# Patient Record
Sex: Female | Born: 1950
Health system: Southern US, Community
[De-identification: ages and names within clinical notes are randomized; demographics above are authoritative.]

## PROBLEM LIST (undated history)

## (undated) DIAGNOSIS — H3500 Unspecified background retinopathy: Secondary | ICD-10-CM

## (undated) DIAGNOSIS — I639 Cerebral infarction, unspecified: Secondary | ICD-10-CM

## (undated) DIAGNOSIS — M1990 Unspecified osteoarthritis, unspecified site: Secondary | ICD-10-CM

## (undated) DIAGNOSIS — D869 Sarcoidosis, unspecified: Secondary | ICD-10-CM

## (undated) DIAGNOSIS — E11319 Type 2 diabetes mellitus with unspecified diabetic retinopathy without macular edema: Secondary | ICD-10-CM

## (undated) DIAGNOSIS — I10 Essential (primary) hypertension: Secondary | ICD-10-CM

## (undated) DIAGNOSIS — H269 Unspecified cataract: Secondary | ICD-10-CM

## (undated) HISTORY — DX: Unspecified background retinopathy: H35.00

## (undated) HISTORY — DX: Unspecified cataract: H26.9

## (undated) HISTORY — DX: Cerebral infarction, unspecified: I63.9

## (undated) HISTORY — DX: Type 2 diabetes mellitus with unspecified diabetic retinopathy without macular edema: E11.319

## (undated) HISTORY — PX: PET/CT MONITORING/KAPOSIS/SARCOM (ARMC HX): HXRAD1179

## (undated) HISTORY — DX: Unspecified osteoarthritis, unspecified site: M19.90

## (undated) HISTORY — PX: INTRAOCULAR LENS INSERTION: SHX110

---

## 1997-06-14 HISTORY — PX: KNEE SURGERY: SHX244

## 2002-04-08 ENCOUNTER — Emergency Department (HOSPITAL_COMMUNITY): Admission: EM | Admit: 2002-04-08 | Discharge: 2002-04-08 | Payer: Self-pay | Admitting: Emergency Medicine

## 2002-08-22 ENCOUNTER — Encounter: Admission: RE | Admit: 2002-08-22 | Discharge: 2002-11-20 | Payer: Self-pay | Admitting: *Deleted

## 2003-08-26 ENCOUNTER — Emergency Department (HOSPITAL_COMMUNITY): Admission: AD | Admit: 2003-08-26 | Discharge: 2003-08-26 | Payer: Self-pay | Admitting: Family Medicine

## 2005-06-19 ENCOUNTER — Emergency Department (HOSPITAL_COMMUNITY): Admission: EM | Admit: 2005-06-19 | Discharge: 2005-06-19 | Payer: Self-pay | Admitting: Family Medicine

## 2006-09-10 ENCOUNTER — Emergency Department (HOSPITAL_COMMUNITY): Admission: EM | Admit: 2006-09-10 | Discharge: 2006-09-10 | Payer: Self-pay | Admitting: Family Medicine

## 2007-03-13 ENCOUNTER — Emergency Department (HOSPITAL_COMMUNITY): Admission: EM | Admit: 2007-03-13 | Discharge: 2007-03-13 | Payer: Self-pay | Admitting: Emergency Medicine

## 2007-11-02 ENCOUNTER — Emergency Department (HOSPITAL_COMMUNITY): Admission: EM | Admit: 2007-11-02 | Discharge: 2007-11-02 | Payer: Self-pay | Admitting: Family Medicine

## 2008-06-04 ENCOUNTER — Encounter: Payer: Self-pay | Admitting: Internal Medicine

## 2008-06-04 LAB — HM DIABETES EYE EXAM

## 2008-06-26 ENCOUNTER — Encounter: Payer: Self-pay | Admitting: Internal Medicine

## 2008-06-26 ENCOUNTER — Ambulatory Visit: Payer: Self-pay | Admitting: Internal Medicine

## 2008-06-26 DIAGNOSIS — E11319 Type 2 diabetes mellitus with unspecified diabetic retinopathy without macular edema: Secondary | ICD-10-CM | POA: Insufficient documentation

## 2008-06-26 DIAGNOSIS — I10 Essential (primary) hypertension: Secondary | ICD-10-CM | POA: Insufficient documentation

## 2008-06-26 DIAGNOSIS — E1165 Type 2 diabetes mellitus with hyperglycemia: Secondary | ICD-10-CM

## 2008-06-26 DIAGNOSIS — E1139 Type 2 diabetes mellitus with other diabetic ophthalmic complication: Secondary | ICD-10-CM

## 2008-06-26 DIAGNOSIS — E1159 Type 2 diabetes mellitus with other circulatory complications: Secondary | ICD-10-CM

## 2008-06-26 DIAGNOSIS — M1712 Unilateral primary osteoarthritis, left knee: Secondary | ICD-10-CM | POA: Insufficient documentation

## 2008-06-26 HISTORY — DX: Type 2 diabetes mellitus with other circulatory complications: E11.59

## 2008-06-26 LAB — CONVERTED CEMR LAB: Blood Glucose, Fingerstick: 279

## 2008-06-27 ENCOUNTER — Encounter: Payer: Self-pay | Admitting: Internal Medicine

## 2008-06-27 LAB — CONVERTED CEMR LAB
ALT: 13 units/L (ref 0–35)
Albumin: 4.1 g/dL (ref 3.5–5.2)
Basophils Absolute: 0 10*3/uL (ref 0.0–0.1)
CO2: 25 meq/L (ref 19–32)
Calcium: 9.5 mg/dL (ref 8.4–10.5)
Chloride: 102 meq/L (ref 96–112)
Creatinine, Urine: 97.9 mg/dL
Eosinophils Relative: 1 % (ref 0–5)
Glucose, Bld: 265 mg/dL — ABNORMAL HIGH (ref 70–99)
HCT: 41.1 % (ref 36.0–46.0)
Lymphocytes Relative: 33 % (ref 12–46)
Lymphs Abs: 2.1 10*3/uL (ref 0.7–4.0)
Microalb Creat Ratio: 34.9 mg/g — ABNORMAL HIGH (ref 0.0–30.0)
Neutro Abs: 3.6 10*3/uL (ref 1.7–7.7)
Neutrophils Relative %: 58 % (ref 43–77)
Platelets: 283 10*3/uL (ref 150–400)
Potassium: 4.5 meq/L (ref 3.5–5.3)
RDW: 13.5 % (ref 11.5–15.5)
Sodium: 139 meq/L (ref 135–145)
TSH: 0.892 microintl units/mL (ref 0.350–4.50)
Total Protein: 7.7 g/dL (ref 6.0–8.3)
WBC: 6.1 10*3/uL (ref 4.0–10.5)

## 2008-07-03 ENCOUNTER — Telehealth (INDEPENDENT_AMBULATORY_CARE_PROVIDER_SITE_OTHER): Payer: Self-pay | Admitting: *Deleted

## 2008-07-10 ENCOUNTER — Ambulatory Visit: Payer: Self-pay | Admitting: Internal Medicine

## 2008-07-10 ENCOUNTER — Encounter: Payer: Self-pay | Admitting: Internal Medicine

## 2008-07-10 LAB — CONVERTED CEMR LAB: Blood Glucose, Fingerstick: 154

## 2008-07-18 ENCOUNTER — Encounter: Payer: Self-pay | Admitting: Internal Medicine

## 2008-07-19 DIAGNOSIS — E785 Hyperlipidemia, unspecified: Secondary | ICD-10-CM

## 2008-07-19 LAB — CONVERTED CEMR LAB
BUN: 14 mg/dL (ref 6–23)
Calcium: 9.1 mg/dL (ref 8.4–10.5)
Chloride: 104 meq/L (ref 96–112)
Creatinine, Ser: 0.81 mg/dL (ref 0.40–1.20)
HDL: 62 mg/dL (ref >39)
LDL Cholesterol: 133 mg/dL — ABNORMAL HIGH (ref 0–99)
Triglycerides: 95 mg/dL (ref <150)

## 2008-07-22 ENCOUNTER — Telehealth (INDEPENDENT_AMBULATORY_CARE_PROVIDER_SITE_OTHER): Payer: Self-pay | Admitting: *Deleted

## 2008-08-26 ENCOUNTER — Ambulatory Visit: Payer: Self-pay | Admitting: Internal Medicine

## 2008-08-26 LAB — CONVERTED CEMR LAB: Hgb A1c MFr Bld: 10.1 %

## 2008-11-06 ENCOUNTER — Ambulatory Visit: Payer: Self-pay | Admitting: Internal Medicine

## 2008-11-06 ENCOUNTER — Encounter (INDEPENDENT_AMBULATORY_CARE_PROVIDER_SITE_OTHER): Payer: Self-pay | Admitting: Internal Medicine

## 2008-11-06 LAB — CONVERTED CEMR LAB: Hgb A1c MFr Bld: 8.2 %

## 2008-11-07 ENCOUNTER — Encounter (INDEPENDENT_AMBULATORY_CARE_PROVIDER_SITE_OTHER): Payer: Self-pay | Admitting: Internal Medicine

## 2008-11-07 LAB — CONVERTED CEMR LAB
ALT: 13 units/L (ref 0–35)
Albumin: 4.1 g/dL (ref 3.5–5.2)
Alkaline Phosphatase: 68 units/L (ref 39–117)
CO2: 25 meq/L (ref 19–32)
GFR calc non Af Amer: 34 mL/min — ABNORMAL LOW (ref >60)
Glucose, Bld: 235 mg/dL — ABNORMAL HIGH (ref 70–99)
HDL: 48 mg/dL (ref >39)
LDL Cholesterol: 91 mg/dL (ref 0–99)
Potassium: 4.8 meq/L (ref 3.5–5.3)
Sodium: 138 meq/L (ref 135–145)
Total Bilirubin: 0.4 mg/dL (ref 0.3–1.2)
Total CHOL/HDL Ratio: 3.4
Total Protein: 7.3 g/dL (ref 6.0–8.3)

## 2008-11-08 ENCOUNTER — Telehealth (INDEPENDENT_AMBULATORY_CARE_PROVIDER_SITE_OTHER): Payer: Self-pay | Admitting: Internal Medicine

## 2008-11-15 ENCOUNTER — Ambulatory Visit: Payer: Self-pay | Admitting: Internal Medicine

## 2008-11-15 ENCOUNTER — Encounter (INDEPENDENT_AMBULATORY_CARE_PROVIDER_SITE_OTHER): Payer: Self-pay | Admitting: Internal Medicine

## 2008-11-18 LAB — CONVERTED CEMR LAB
Calcium: 9.4 mg/dL (ref 8.4–10.5)
GFR calc Af Amer: 52 mL/min — ABNORMAL LOW (ref >60)
GFR calc non Af Amer: 43 mL/min — ABNORMAL LOW (ref >60)
Potassium: 4.3 meq/L (ref 3.5–5.3)
Sodium: 141 meq/L (ref 135–145)

## 2008-11-20 ENCOUNTER — Ambulatory Visit: Payer: Self-pay | Admitting: Internal Medicine

## 2008-11-20 LAB — CONVERTED CEMR LAB
OCCULT 1: NEGATIVE
OCCULT 2: NEGATIVE
OCCULT 3: NEGATIVE

## 2008-11-20 LAB — FECAL OCCULT BLOOD, GUAIAC: Fecal Occult Blood: NEGATIVE

## 2008-12-31 ENCOUNTER — Ambulatory Visit (HOSPITAL_COMMUNITY): Admission: RE | Admit: 2008-12-31 | Discharge: 2008-12-31 | Payer: Self-pay | Admitting: Internal Medicine

## 2009-01-06 ENCOUNTER — Encounter: Admission: RE | Admit: 2009-01-06 | Discharge: 2009-01-06 | Payer: Self-pay | Admitting: Internal Medicine

## 2009-01-06 ENCOUNTER — Encounter: Payer: Self-pay | Admitting: Internal Medicine

## 2009-01-06 LAB — HM MAMMOGRAPHY

## 2009-01-07 ENCOUNTER — Ambulatory Visit: Payer: Self-pay | Admitting: Internal Medicine

## 2009-01-07 ENCOUNTER — Encounter: Payer: Self-pay | Admitting: Internal Medicine

## 2009-01-07 LAB — CONVERTED CEMR LAB
BUN: 16 mg/dL (ref 6–23)
CO2: 25 meq/L (ref 19–32)
Calcium: 9.9 mg/dL (ref 8.4–10.5)
Creatinine, Ser: 1.07 mg/dL (ref 0.40–1.20)
Glucose, Bld: 166 mg/dL — ABNORMAL HIGH (ref 70–99)

## 2009-03-12 ENCOUNTER — Ambulatory Visit: Payer: Self-pay | Admitting: Internal Medicine

## 2009-03-12 LAB — CONVERTED CEMR LAB: Blood Glucose, Fingerstick: 213

## 2009-04-11 ENCOUNTER — Ambulatory Visit: Payer: Self-pay | Admitting: Internal Medicine

## 2009-04-23 ENCOUNTER — Telehealth: Payer: Self-pay | Admitting: Internal Medicine

## 2009-05-26 ENCOUNTER — Encounter: Payer: Self-pay | Admitting: Internal Medicine

## 2009-10-01 ENCOUNTER — Encounter: Payer: Self-pay | Admitting: Internal Medicine

## 2009-10-29 ENCOUNTER — Telehealth: Payer: Self-pay | Admitting: Internal Medicine

## 2009-10-30 ENCOUNTER — Encounter: Payer: Self-pay | Admitting: Internal Medicine

## 2009-10-30 ENCOUNTER — Ambulatory Visit (HOSPITAL_COMMUNITY): Admission: RE | Admit: 2009-10-30 | Discharge: 2009-10-30 | Payer: Self-pay | Admitting: Internal Medicine

## 2009-10-30 ENCOUNTER — Ambulatory Visit: Payer: Self-pay | Admitting: Internal Medicine

## 2009-10-30 LAB — CONVERTED CEMR LAB: Hgb A1c MFr Bld: 8.8 %

## 2009-10-31 LAB — CONVERTED CEMR LAB
Albumin: 3.9 g/dL (ref 3.5–5.2)
BUN: 25 mg/dL — ABNORMAL HIGH (ref 6–23)
Basophils Relative: 1 % (ref 0–1)
CO2: 24 meq/L (ref 19–32)
Cholesterol: 209 mg/dL — ABNORMAL HIGH (ref 0–200)
Eosinophils Relative: 3 % (ref 0–5)
Glucose, Bld: 235 mg/dL — ABNORMAL HIGH (ref 70–99)
HCT: 38.9 % (ref 36.0–46.0)
Hemoglobin: 12.3 g/dL (ref 12.0–15.0)
MCHC: 31.6 g/dL (ref 30.0–36.0)
MCV: 79.2 fL (ref >=78.0)
Monocytes Absolute: 0.4 10*3/uL (ref 0.1–1.0)
Monocytes Relative: 6 % (ref 3–12)
Neutro Abs: 4.3 10*3/uL (ref 1.7–7.7)
Potassium: 4.8 meq/L (ref 3.5–5.3)
RBC: 4.91 M/uL (ref 3.87–5.11)
Sodium: 136 meq/L (ref 135–145)
Total Bilirubin: 0.3 mg/dL (ref 0.3–1.2)
Total Protein: 8 g/dL (ref 6.0–8.3)
Triglycerides: 95 mg/dL (ref <150)

## 2009-11-14 ENCOUNTER — Ambulatory Visit: Payer: Self-pay | Admitting: Internal Medicine

## 2009-12-29 ENCOUNTER — Telehealth: Payer: Self-pay | Admitting: Internal Medicine

## 2010-01-02 ENCOUNTER — Telehealth: Payer: Self-pay | Admitting: Internal Medicine

## 2010-01-26 ENCOUNTER — Telehealth: Payer: Self-pay | Admitting: Internal Medicine

## 2010-01-26 ENCOUNTER — Ambulatory Visit: Payer: Self-pay | Admitting: Internal Medicine

## 2010-01-26 LAB — CONVERTED CEMR LAB
Blood Glucose, AC Bkfst: 195 mg/dL
Hgb A1c MFr Bld: 8.2 %

## 2010-01-26 LAB — HM DIABETES FOOT EXAM

## 2010-05-11 ENCOUNTER — Ambulatory Visit: Payer: Self-pay | Admitting: Internal Medicine

## 2010-05-11 ENCOUNTER — Encounter: Payer: Self-pay | Admitting: Internal Medicine

## 2010-05-11 DIAGNOSIS — D869 Sarcoidosis, unspecified: Secondary | ICD-10-CM

## 2010-05-11 LAB — CONVERTED CEMR LAB: Blood Glucose, Fingerstick: 229

## 2010-07-14 NOTE — Assessment & Plan Note (Signed)
Summary: acute-recheck bp and diabetes([place])/cfb   Vital Signs:  Patient profile:   60 year old female Height:      64 inches Weight:      24.1 pounds BMI:     4.15 Temp:     98.8 degrees F oral Pulse rate:   72 / minute BP sitting:   134 / 77  (right arm)  Vitals Entered By: Filomena Jungling NT II (January 26, 2010 10:21 AM)   Diabetic Foot Exam Last Podiatry Exam Date: 01/26/2010  Foot Inspection Is there a history of a foot ulcer?              No Is there a foot ulcer now?              No Is there swelling or an abnormal foot shape?          No Are the toenails long?                No Are the toenails thick?                No Are the toenails ingrown?              No Is there heavy callous build-up?              No Is there pain in the calf muscle (Intermittent claudication) when walking?    NoIs there a claw toe deformity?              No Is there elevated skin temperature?            No Is there limited ankle dorsiflexion?            No Is there foot or ankle muscle weakness?            No  Diabetic Foot Care Education Pulse Check          Right Foot          Left Foot Posterior Tibial:        normal            normal Dorsalis Pedis:        normal            normal  High Risk Feet? Yes   10-g (5.07) Semmes-Weinstein Monofilament Test           Right Foot          Left Foot Visual Inspection               Test Control      normal         normal Site 1         normal         normal Site 2         normal         normal Site 3         normal         normal Site 4         normal         normal Site 5         normal         normal Site 6         normal         normal Site 7         normal         normal Site 8  normal         normal Site 9         normal         normal Site 10         normal         normal  Impression      normal         normal CC: ? CYST ON NECKSINCE THURSDAY Is Patient Diabetic? Yes Did you bring your meter with you today? No Pain  Assessment Patient in pain? yes     Location: HEADACHE Intensity: 9 Type: aching Onset of pain  SINCE,THURSDAY Nutritional Status BMI of > 30 = obese  Have you ever been in a relationship where you felt threatened, hurt or afraid?No   Does patient need assistance? Functional Status Self care Ambulation Normal   Primary Care Provider:  Blondell Reveal MD  CC:  ? CYST ON NECKSINCE THURSDAY.  History of Present Illness: 1. c/o a "boil to a nape area; recurrent; but "now it drains and very sore." Denies any fever, chills, HA, rash or any other Sx. 2. No other concerns.  Preventive Screening-Counseling & Management  Alcohol-Tobacco     Smoking Status: quit     Year Quit: 1977     Passive Smoke Exposure: no  Caffeine-Diet-Exercise     Does Patient Exercise: yes     Type of exercise: walking     Times/week: 2  Problems Prior to Update: 1)  Furuncle, Recurrent  (ICD-680.9) 2)  Hordeolum, Internal  (ICD-373.12) 3)  Unspecified Arthropathy Shoulder Region  (ICD-716.91) 4)  Insomnia  (ICD-780.52) 5)  Renal Insufficiency, Acute  (ICD-585.9) 6)  Menopause-related Vasomotor Symptoms, Hot Flashes  (ICD-627.2) 7)  Hyperlipidemia  (ICD-272.4) 8)  Osteoarthritis, Knee, Left  (ICD-715.96) 9)  Pulmonary Sarcoidosis  (ICD-135) 10)  Diabetic Retinopathy  (ICD-250.50) 11)  Essential Hypertension, Benign  (ICD-401.1) 12)  Dm  (ICD-250.00)  Medications Prior to Update: 1)  Glipizide 10 Mg Tabs (Glipizide) .... Take 1 Tablet By Mouth Once A Day 2)  Atenolol-Chlorthalidone 100-25 Mg Tabs (Atenolol-Chlorthalidone) .... Take 1 Tablet By Mouth Once A Day At Bedtime 3)  Benadryl 25 Mg Caps (Diphenhydramine Hcl) .... Take 1 Tablet At Bedtime As Needed Insomnia.  Allergies (verified): No Known Drug Allergies  Directives (verified): 1)  Full Code   Past History:  Past Medical History: Last updated: 07/10/2008 HTN- diagnosed 4 yrs ago. DM- diagnosed 5 yrs ago. Pulmonary Sarcoidosis -  She was  treated with Prednisone and some other medication which she couldn't remeber at [facility] for nearly 5 years. Apparently they tappered her Prednisone and oneday radiologist said she has no radiological evidence of Prednisone and since then she stopped following up there. (The entire history is as per patient) Bilateral Diabetic retinopathy.      Past Surgical History: Last updated: 2008/07/17 Lleft knee arthroscopy  Family History: Last updated: 2008/07/17 Mom - DM , HTN, died at 28. Dad - unknown. 4 brothers:  1 brother died of lung cancer, 1 died of stomach cancer, 1 brother died of  heart attack (40's) 2 sisters: 1 sister died (murdered) and the other healthy.  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 married, lives with husband 4 sons - healthy , 1 htn. Cell number : [phone]  Risk Factors: Exercise: yes (01/26/2010)  Risk Factors: Smoking Status: quit (01/26/2010)  Passive Smoke Exposure: no (01/26/2010)  Review of Systems       per HPI  Physical Exam  General:  alert, well-developed, well-nourished, and well-hydrated.   Head:  normocephalic and atraumatic.   Eyes:  Mild swelling of R eyelid. No erythema overlying lid, no injection, no chemosis, no mucopurulent discharge. Attempt to evert eyelid but was limited given patient discomfort. Noted small swelling on inner eyelid near medial canthus c/w hordeolum. Vision grossly intact.   Ears:  no external deformities.   Nose:  no external deformity, no external erythema, and no nasal discharge.   Mouth:  Oral mucosa and oropharynx without lesions or exudates.  Teeth in good repair. Neck:  No deformities, masses, or tenderness noted. Lungs:  normal respiratory effort, normal breath sounds, no crackles, and no wheezes.   Abdomen:  soft and non-tender.   Msk:  No deformity or scoliosis noted of thoracic  or lumbar spine.  She has severely spasmed trapezius and limited ROM of her left shoulder. She is unable to do teh "back scratch manuever". Multiple thoracic, cervical tender points. Limited ROM rotational of her neck Pulses:  R radial normal.   Extremities:  trace left pedal edema and trace right pedal edema. pulses normal bilat.  Neurologic:  alert & oriented X3, cranial nerves II-XII intact, and gait normal.   Skin:  turgor normal and color normal.   Cervical Nodes:  No lymphadenopathy noted Axillary Nodes:  No palpable lymphadenopathy Psych:  Cognition and judgment appear intact. Alert and cooperative with normal attention span and concentration. No apparent delusions, illusions, hallucinations  Diabetes Management Exam:    Foot Exam (with socks and/or shoes not present):       Sensory-Monofilament:          Left foot: normal          Right foot: normal   Impression & Recommendations:  Problem # 1:  RENAL INSUFFICIENCY, ACUTE (ICD-585.9) Will recheck renal fxn. Labs Reviewed: BUN: 25 (10/30/2009)   Cr: 1.31 (10/30/2009)    Hgb: 12.3 (10/30/2009)   Hct: 38.9 (10/30/2009)   Ca++: 9.3 (10/30/2009)    TP: 8.0 (10/30/2009)   Alb: 3.9 (10/30/2009)  Problem # 2:  HYPERLIPIDEMIA (ICD-272.4) Low fat diet; exercise discussed. Labs Reviewed: SGOT: 19 (10/30/2009)   SGPT: 13 (10/30/2009)  Lipid Goals: LDL Goal: 100 (11/14/2009)     Prior 10 Yr Risk Heart Disease: 15 % (11/14/2009)   HDL:55 (10/30/2009), 48 (11/06/2008)  LDL:135 (10/30/2009), 91 (47/82/9562)  Chol:209 (10/30/2009), 162 (11/06/2008)  Trig:95 (10/30/2009), 114 (11/06/2008)  Problem # 3:  ESSENTIAL HYPERTENSION, BENIGN (ICD-401.1) Controlled. Her updated medication list for this problem includes:    Atenolol-chlorthalidone 100-25 Mg Tabs (Atenolol-chlorthalidone) .Marland Kitchen... Take 1 tablet by mouth once a day at bedtime  BP today: 134/77 Prior BP: 138/81 (11/14/2009)  Prior 10 Yr Risk Heart Disease: 15 % (11/14/2009)  Labs  Reviewed: K+: 4.8 (10/30/2009) Creat: : 1.31 (10/30/2009)   Chol: 209 (10/30/2009)   HDL: 55 (10/30/2009)   LDL: 135 (10/30/2009)   TG: 95 (10/30/2009)  Problem # 4:  FURUNCLE, RECURRENT (ICD-680.9) Doxycycline by mouth for 10 days; Tramdaol as needed for pain. Instructed on wound care. Orders: Surgical Referral (Surgery)  Complete Medication List: 1)  Glipizide 10 Mg Tabs (Glipizide) .... Take 1 tablet by mouth once a day 2)  Atenolol-chlorthalidone 100-25 Mg Tabs (Atenolol-chlorthalidone) .... Take 1 tablet by mouth once a day at bedtime 3)  Benadryl 25 Mg Caps (Diphenhydramine hcl) .... Take 1 tablet at  bedtime as needed insomnia. 4)  Doxycycline Hyclate 100 Mg Tabs (Doxycycline hyclate) .... Take 1 tablet by mouth two times a day with meals  for 10 days 5)  Tramadol Hcl 50 Mg Tabs (Tramadol hcl) .... Take 1 tablet by mouth four times a day as needed  Other Orders: T- Capillary Blood Glucose (62130) T-Hgb A1C (in-house) (86578IO) T-Urine Microalbumin w/creat. ratio [id number]) Ophthalmology Referral (Ophthalmology) Mammogram (Screening) (Mammo)  Patient Instructions: 1)  Please, return to clinic in 3 months (FASTING). 2)  Call with any questions. 3)  Call and ask for Ms. Mamie if do not hear from [place] about a referral to a surgeon for 1 week. Prescriptions: ATENOLOL-CHLORTHALIDONE 100-25 MG TABS (ATENOLOL-CHLORTHALIDONE) Take 1 tablet by mouth once a day at bedtime  #30 x 11   Entered and Authorized by:   Deatra Robinson MD   Signed by:   Deatra Robinson MD on 01/26/2010   Method used:   Electronically to        [facility] [address] (retail)       [phone]       [place], [place]  [address]       Ph: [phone]       Fax: [phone]   RxID:   [id number] GLIPIZIDE 10 MG TABS (GLIPIZIDE) Take 1 tablet by mouth once a day  #30 x 7   Entered and Authorized by:   Deatra Robinson MD   Signed by:   Deatra Robinson MD on  01/26/2010   Method used:   Electronically to        [facility] [address] (retail)       [phone]       [place], [place]  [address]       Ph: [phone]       Fax: [phone]   RxID:   [id number] TRAMADOL HCL  POWD (TRAMADOL HCL) four times a day as needed  #60 x 1   Entered and Authorized by:   Deatra Robinson MD   Signed by:   Deatra Robinson MD on 01/26/2010   Method used:   Electronically to        [facility] [address] (retail)       [phone]       [place], [place]  [address]       Ph: [phone]       Fax: [phone]   RxID:   [id number] DOXYCYCLINE HYCLATE 100 MG TABS (DOXYCYCLINE HYCLATE) Take 1 tablet by mouth two times a day with meals  for 10 days  #20 x 0   Entered and Authorized by:   Deatra Robinson MD   Signed by:   Deatra Robinson MD on 01/26/2010   Method used:   Electronically to        [facility] [address] (retail)       [phone]       [place], [place]  [address]       Ph: [phone]       Fax: [phone]   RxID:   [id number]  Process Orders Check Orders Results:     [facility]: ABN not required for this insurance Tests Sent for requisitioning (January 27, 2010 12:20 PM):     01/26/2010: [facility] --  T-Urine Microalbumin w/creat. ratio [[id number]] (signed)    Prevention & Chronic Care Immunizations   Influenza vaccine: Not documented   Influenza vaccine deferral: Not indicated  (04/11/2009)   Influenza vaccine due: 02/12/2010    Tetanus booster: Not documented   Td booster deferral: Deferred  (01/26/2010)   Tetanus booster due: 01/27/2020    Pneumococcal vaccine: Not documented   Pneumococcal vaccine deferral: Refused  (04/11/2009)  Colorectal Screening   Hemoccult: Not documented   Hemoccult action/deferral: Refused  (01/26/2010)   Hemoccult due: 01/27/2011     Colonoscopy: Not documented   Colonoscopy action/deferral: Refused  (01/26/2010)  Other Screening   Pap smear: Not documented   Pap smear action/deferral: Refused  (01/26/2010)   Pap smear due: 01/27/2011    Mammogram: BI-RADS CATEGORY 3:  Probably benign finding(s) - short interval^MM DIGITAL DIAG LTD R  (01/06/2009)   Mammogram action/deferral: Ordered  (01/26/2010)   Smoking status: quit  (01/26/2010)  Diabetes Mellitus   HgbA1C: 8.2  (01/26/2010)    Eye exam: Mild non-proliferative diabetic retinopathy.   Clinically significant macular edema.   Cataract.   exam by Heather Burundi, OD Referral recommended to retinal specialist  (06/04/2008)   Diabetic eye exam action/deferral: Refused  (01/26/2010)   Eye exam due: 09/2008    Foot exam: yes  (01/26/2010)   Foot exam action/deferral: Do today   High risk foot: Yes  (01/26/2010)   Foot care education: Not documented    Urine microalbumin/creatinine ratio: 34.9  (06/26/2008)   Urine microalbumin action/deferral: Ordered   Urine microalbumin/cr due: 01/27/2011    Diabetes flowsheet reviewed?: Yes   Progress toward A1C goal: Improved  Lipids   Total Cholesterol: 209  (10/30/2009)   LDL: 135  (10/30/2009)   LDL Direct: Not documented   HDL: 55  (10/30/2009)   Triglycerides: 95  (10/30/2009)   Lipid panel due: 10/31/2010    SGOT (AST): 19  (10/30/2009)   SGPT (ALT): 13  (10/30/2009)   Alkaline phosphatase: 98  (10/30/2009)   Total bilirubin: 0.3  (10/30/2009)    Lipid flowsheet reviewed?: Yes   Progress toward LDL goal: Unchanged  Hypertension   Last Blood Pressure: 134 / 77  (01/26/2010)   Serum creatinine: 1.31  (10/30/2009)   Serum potassium 4.8  (10/30/2009)    Hypertension flowsheet reviewed?: Yes   Progress toward BP goal: Unchanged  Self-Management Support :    Patient will work on the following items until the next clinic visit to reach self-care goals:     Medications and monitoring: take my medicines  every day, check my blood sugar  (01/26/2010)     Eating: drink diet soda or water instead of juice or soda, eat more vegetables, use fresh or frozen vegetables, eat baked foods instead of fried foods, eat fruit for snacks and desserts  (01/26/2010)     Activity: take a 30 minute walk every day  (01/26/2010)    Diabetes self-management support: Education handout, Resources for patients handout  (01/26/2010)   Diabetes education handout printed    Hypertension self-management support: Education handout, Resources for patients handout  (01/26/2010)   Hypertension education handout printed    Lipid self-management support: Education handout, Resources for patients handout  (01/26/2010)     Lipid education handout printed      Resource handout printed.   Nursing Instructions: Schedule screening mammogram (see order) Diabetic foot exam today    Laboratory Results   Blood Tests   Date/Time Received: January 26, 2010 10:35 AM  Date/Time Reported: Burke Keels  January 26, 2010 10:36 AM   HGBA1C: 8.2%   (Normal Range: Non-Diabetic - 3-6%   Control Diabetic - 6-8%) CBG Fasting:: 195mg /dL

## 2010-07-14 NOTE — Progress Notes (Signed)
Summary: Refill/gh  Phone Note Refill Request Message from:  Fax from Pharmacy on January 02, 2010 11:13 AM  Refills Requested: Medication #1:  ATENOLOL-CHLORTHALIDONE 100-25 MG TABS Take 1 tablet by mouth once a day at bedtime   Last Refilled: 11/30/2009  Method Requested: Electronic Initial call taken by: Angelina Ok RN,  January 02, 2010 11:13 AM  Follow-up for Phone Call       Follow-up by: Blondell Reveal MD,  January 03, 2010 5:44 PM    Prescriptions: ATENOLOL-CHLORTHALIDONE 100-25 MG TABS (ATENOLOL-CHLORTHALIDONE) Take 1 tablet by mouth once a day at bedtime  #30 x 1   Entered and Authorized by:   Blondell Reveal MD   Signed by:   Blondell Reveal MD on 01/03/2010   Method used:   Electronically to        [facility] [address] (retail)       [phone]       [place], [place]  [address]       Ph: [phone]       Fax: [phone]   RxID:   [id number]

## 2010-07-14 NOTE — Progress Notes (Signed)
Summary: refill/ hla  Phone Note Refill Request Message from:  Fax from Pharmacy on December 29, 2009 3:29 PM  Refills Requested: Medication #1:  GLIPIZIDE 10 MG TABS Take 1 tablet by mouth once a day   Last Refilled: 6/19 last visit 6/3 and 5/19 for labs  Initial call taken by: Marin Roberts RN,  December 29, 2009 3:30 PM  Follow-up for Phone Call       Follow-up by: Blondell Reveal MD,  January 01, 2010 2:46 PM    Prescriptions: GLIPIZIDE 10 MG TABS (GLIPIZIDE) Take 1 tablet by mouth once a day  #30 x 5   Entered and Authorized by:   Blondell Reveal MD   Signed by:   Blondell Reveal MD on 01/01/2010   Method used:   Electronically to        [facility] [address] (retail)       [phone]       [place], [place]  [address]       Ph: [phone]       Fax: [phone]   RxID:   [id number]

## 2010-07-14 NOTE — Assessment & Plan Note (Signed)
Summary: Wendy [name]/2 WEEK RECHECK PER GOLDING/CH   Vital Signs:  Patient profile:   60 year old female Height:      64 inches Weight:      243.1 pounds BMI:     41.88 Temp:     97.9 degrees F oral Pulse rate:   77 / minute BP sitting:   138 / 81  (right arm)  Vitals Entered By: Filomena Jungling NT II (November 14, 2009 3:27 PM) CC: EYE HURT X 1 WEEK, TRAZONDONE MAKES FEEL FUNNY Is Patient Diabetic? Yes Did you bring your meter with you today? No Pain Assessment Patient in pain? yes     Location: eye Intensity: 5 Nutritional Status BMI of > 30 = obese  Have you ever been in a relationship where you felt threatened, hurt or afraid?No   Does patient need assistance? Functional Status Self care Ambulation Normal    Primary Care Provider:  Blondell Reveal MD  CC:  EYE HURT X 1 WEEK and TRAZONDONE MAKES FEEL FUNNY.  History of Present Illness: Patient presents for 2 week followup regarding:  BP: Tolerating new medications well. Appears to have better BP control during last 2 weeks. Denies dizziness, chest pain, palpitations or shortness of breath.  Insomnia - trazadone helps a little, makes her loopy in the morning. Denies obstructive sleep apnea symptoms. Trouble getting to sleep and staying asleep - gets about 4 hours per night, then has to sleep some during the day. Affirms that she has changed some habits in order to improve her sleep hygiene already.  R eye pain - being seen at [facility] for diabetic retinopathy, last appointment was in April. Feels like upper eyelid was swollen, also painful and throbbing. She feels like something is "in it" when referring to the eye (not the lid). Some tearing, minimal discharge, no redness. Dry crust at medial canthus in the morning. Has lasted about a week. Called her ophthalmologist who thought it may be a sty and recommended warm compresses as tolerated.  Preventive Screening-Counseling & Management  Alcohol-Tobacco  Smoking Status: quit     Year Quit: 1977     Passive Smoke Exposure: no  Caffeine-Diet-Exercise     Does Patient Exercise: yes     Type of exercise: walking     Times/week: 2  Current Medications (verified): 1)  Glipizide 10 Mg Tabs (Glipizide) .... Take 1 Tablet By Mouth Once A Day 2)  Atenolol-Chlorthalidone 100-25 Mg Tabs (Atenolol-Chlorthalidone) .... Take 1 Tablet By Mouth Once A Day At Bedtime 3)  Trazodone Hcl 100 Mg Tabs (Trazodone Hcl) .... Take 1 Tablet By Mouth Once A Day At Bedtime  Allergies (verified): No Known Drug Allergies  Review of Systems      See HPI  Physical Exam  General:  alert, well-developed, well-nourished, and well-hydrated.   Head:  normocephalic and atraumatic.   Eyes:  Mild swelling of R eyelid. No erythema overlying lid, no injection, no chemosis, no mucopurulent discharge. Attempt to evert eyelid but was limited given patient discomfort. Noted small swelling on inner eyelid near medial canthus c/w hordeolum. Vision grossly intact.   Ears:  no external deformities.   Nose:  no external deformity, no external erythema, and no nasal discharge.   Lungs:  normal respiratory effort, normal breath sounds, no crackles, and no wheezes.   Heart:  normal rate, regular rhythm, no murmur, no gallop, and no rub.   Abdomen:  soft and non-tender.   Neurologic:  alert &  oriented X3, cranial nerves II-XII intact, and gait normal.   Skin:  turgor normal and color normal.   Psych:  Oriented X3, memory intact for recent and remote, normally interactive, good eye contact, not anxious appearing, and not depressed appearing.     Impression & Recommendations:  Problem # 1:  ESSENTIAL HYPERTENSION, BENIGN (ICD-401.1) Patient has much improved blood pressure with new regimen. No reported side effects at this visit. If possible, will need ACEi or ARB (Note: about 1 year prior the patient was tried on an ACEi and had elevation in SCr per physician notes - would start ARB  first). Had advised patient that she is almost at goal with current regimen and given recent change to medications, felt it would be better to wait before addition of another medication as patient is very hesitant with starting new medications.  Her updated medication list for this problem includes:    Atenolol-chlorthalidone 100-25 Mg Tabs (Atenolol-chlorthalidone) .Marland Kitchen... Take 1 tablet by mouth once a day at bedtime  Problem # 2:  DM (ICD-250.00) Getting treatment for diabetic retinopathy. Still having some issues with blood sugar control. Recommended weight loss and better portion control which will also benefit her in terms of her diabetes.  Her updated medication list for this problem includes:    Glipizide 10 Mg Tabs (Glipizide) .Marland Kitchen... Take 1 tablet by mouth once a day  Problem # 3:  INSOMNIA (ICD-780.52) Have recommended and provided a prescription for benadryl, switching from trazadone. Advised the patient on good sleep habits which she reports that she already does. She states that trazadone did help some but made her feel "loopy" the next day. May consider trying again at lower dose if benadryl does not help with insomnia.  Problem # 4:  HORDEOLUM, INTERNAL (ICD-373.12) Appears to have a sty on the inner surface of her R eyelid near the medial canthus. Recommended warm compresses and patient already has follow up with her ophthalmologist next Tuesday. She states that it seems to have improved since intially calling her ophthalmologist, may be resolving already.  Problem # 5:  HYPERLIPIDEMIA (ICD-272.4) Not currently on any medication, last check with elevated LDL in 130s. Goal for patient should be at least less than 100. Did not have time to discuss with patient about further statin therapy, but appears that she was on pravastatin in the past and it was removed from her medication list at last appointment as she was not taking that medication.  Complete Medication List: 1)  Glipizide 10  Mg Tabs (Glipizide) .... Take 1 tablet by mouth once a day 2)  Atenolol-chlorthalidone 100-25 Mg Tabs (Atenolol-chlorthalidone) .... Take 1 tablet by mouth once a day at bedtime 3)  Benadryl 25 Mg Caps (Diphenhydramine hcl) .... Take 1 tablet at bedtime as needed insomnia.  Patient Instructions: 1)  Please schedule a follow-up appointment in 1 month. 2)  It is important that you exercise regularly at least 20 minutes 5 times a week. If you develop chest pain, have severe difficulty breathing, or feel very tired , stop exercising immediately and seek medical attention. 3)  You need to lose weight. Consider a lower calorie diet and regular exercise.  4)  Check your blood sugars regularly. If your readings are usually above 300 or below 70 you should contact our office. 5)  It is important that your Diabetic A1c level is checked every 3 months. 6)  See your eye doctor yearly to check for diabetic eye damage. 7)  Check your  feet each night for sore areas, calluses or signs of infection. 8)  Check your Blood Pressure regularly. If it is above 150/100 you should make an appointment. Prescriptions: BENADRYL 25 MG CAPS (DIPHENHYDRAMINE HCL) Take 1 tablet at bedtime as needed insomnia.  #30 x 1   Entered and Authorized by:   Nilda Riggs MD   Signed by:   Nilda Riggs MD on 11/14/2009   Method used:   Electronically to        [facility] [address] (retail)       [phone]       [place], [place]  [address]       Ph: [phone]       Fax: [phone]   RxID:   [id number]   Prevention & Chronic Care Immunizations   Influenza vaccine: Not documented   Influenza vaccine deferral: Not indicated  (04/11/2009)    Tetanus booster: Not documented    Pneumococcal vaccine: Not documented   Pneumococcal vaccine deferral: Refused  (04/11/2009)  Colorectal Screening   Hemoccult: Not documented    Colonoscopy: Not documented  Other Screening   Pap  smear: Not documented    Mammogram: BI-RADS CATEGORY 3:  Probably benign finding(s) - short interval^MM DIGITAL DIAG LTD R  (01/06/2009)   Smoking status: quit  (11/14/2009)  Diabetes Mellitus   HgbA1C: 8.8  (10/30/2009)    Eye exam: Mild non-proliferative diabetic retinopathy.   Clinically significant macular edema.   Cataract.   exam by Heather Burundi, OD Referral recommended to retinal specialist  (06/04/2008)   Eye exam due: 09/2008    Foot exam: Not documented   High risk foot: Not documented   Foot care education: Not documented    Urine microalbumin/creatinine ratio: 34.9  (06/26/2008)    Diabetes flowsheet reviewed?: Yes   Progress toward A1C goal: Unchanged  Lipids   Total Cholesterol: 209  (10/30/2009)   LDL: 135  (10/30/2009)   LDL Direct: Not documented   HDL: 55  (10/30/2009)   Triglycerides: 95  (10/30/2009)    SGOT (AST): 19  (10/30/2009)   SGPT (ALT): 13  (10/30/2009)   Alkaline phosphatase: 98  (10/30/2009)   Total bilirubin: 0.3  (10/30/2009)    Lipid flowsheet reviewed?: Yes   Progress toward LDL goal: Unchanged  Hypertension   Last Blood Pressure: 138 / 81  (11/14/2009)   Serum creatinine: 1.31  (10/30/2009)   Serum potassium 4.8  (10/30/2009)    Hypertension flowsheet reviewed?: Yes   Progress toward BP goal: Improved  Self-Management Support :    Diabetes self-management support: Copy of home glucose meter record, Written self-care plan, Education handout, Pre-printed educational material  (10/30/2009)    Hypertension self-management support: Education handout, Psychologist, forensic, Written self-care plan  (10/30/2009)    Lipid self-management support: Written self-care plan, Education handout, Pre-printed educational material  (10/30/2009)

## 2010-07-14 NOTE — Miscellaneous (Signed)
  Clinical Lists Changes  Problems: Removed problem of MENOPAUSE-RELATED VASOMOTOR SYMPTOMS, HOT FLASHES (ICD-627.2) Removed problem of RENAL INSUFFICIENCY, ACUTE (ICD-585.9) Removed problem of INSOMNIA (ICD-780.52) Removed problem of HORDEOLUM, INTERNAL (ICD-373.12) Removed problem of FURUNCLE, RECURRENT (ICD-680.9) Removed problem of UNSPECIFIED ARTHROPATHY SHOULDER REGION (ICD-716.91) Changed problem from PULMONARY SARCOIDOSIS (ICD-135) to History of  PULMONARY SARCOIDOSIS (ICD-135) Removed problem of NEED PROPHYLACTIC VACCINATION&INOCULATION FLU (ICD-V04.81)

## 2010-07-14 NOTE — Progress Notes (Signed)
Summary: clarification/ hla  Phone Note From Pharmacy   Summary of Call: please clarify tramadol script...it is for powder...please change the med list to reflect any changes and may send electronically Initial call taken by: Marin Roberts RN,  January 26, 2010 12:50 PM  Follow-up for Phone Call        Prescription resent Follow-up by: Deatra Robinson MD,  January 26, 2010 3:31 PM    New/Updated Medications: TRAMADOL HCL 50 MG TABS (TRAMADOL HCL) Take 1 tablet by mouth four times a day as needed Prescriptions: TRAMADOL HCL 50 MG TABS (TRAMADOL HCL) Take 1 tablet by mouth four times a day as needed  #120 x 11   Entered and Authorized by:   Deatra Robinson MD   Signed by:   Deatra Robinson MD on 01/26/2010   Method used:   Electronically to        [facility] [address] (retail)       [phone]       [place], [place]  [address]       Ph: [phone]       Fax: [phone]   RxID:   [id number]

## 2010-07-14 NOTE — Assessment & Plan Note (Signed)
Summary: EST-3 MONTH F/U VISIT/CH   Vital Signs:  Patient profile:   60 year old female Height:      64 inches (162.56 cm) Weight:      254.4 pounds (10.95 kg) BMI:     4.15 Temp:     97.3 degrees F (36.28 degrees C) oral Pulse rate:   68 / minute BP sitting:   142 / 77  (right arm) Cuff size:   large  Vitals Entered By: Theotis Barrio NT II (May 11, 2010 1:23 PM) CC: ROUTINE OFFICE VISIT WITH  NO COMPLAINTS Is Patient Diabetic? Yes Did you bring your meter with you today? No Pain Assessment Patient in pain? no      Nutritional Status BMI of > 30 = obese CBG Result 229  Have you ever been in a relationship where you felt threatened, hurt or afraid?No   Does patient need assistance? Functional Status Self care Ambulation Normal   Primary Care Provider:  Blondell Reveal MD  CC:  ROUTINE OFFICE VISIT WITH  NO COMPLAINTS.  History of Present Illness: 60 yr old lady with PMH as mentioned in the EMR comes to the office for regular office visit.  1. HTN: She reports that she takes her BP medications every other day as they are giving her "dreams" she reports that she has been eating more, eating more salt recently.   2. DM: She reports taking herglipizide every day. She reports that she lost her strips and hasn't been checking her blood sugars. The last time she checked her blood sugars was about two months ago.Marland Kitchen "I have been eating everything", she reports. She gained nearly 20 pounds in the last one year. She states that " I am going to better than this"  She denies any other complaints.  Preventive Screening-Counseling & Management  Alcohol-Tobacco     Smoking Status: quit     Year Quit: 1977     Passive Smoke Exposure: no  Caffeine-Diet-Exercise     Does Patient Exercise: yes     Type of exercise: walking     Times/week: 2  Problems Prior to Update: 1)  Furuncle, Recurrent  (ICD-680.9) 2)  Hordeolum, Internal  (ICD-373.12) 3)  Unspecified Arthropathy  Shoulder Region  (ICD-716.91) 4)  Insomnia  (ICD-780.52) 5)  Renal Insufficiency, Acute  (ICD-585.9) 6)  Menopause-related Vasomotor Symptoms, Hot Flashes  (ICD-627.2) 7)  Hyperlipidemia  (ICD-272.4) 8)  Osteoarthritis, Knee, Left  (ICD-715.96) 9)  Pulmonary Sarcoidosis  (ICD-135) 10)  Diabetic Retinopathy  (ICD-250.50) 11)  Essential Hypertension, Benign  (ICD-401.1) 12)  Dm  (ICD-250.00)  Medications Prior to Update: 1)  Glipizide 10 Mg Tabs (Glipizide) .... Take 1 Tablet By Mouth Once A Day 2)  Atenolol-Chlorthalidone 100-25 Mg Tabs (Atenolol-Chlorthalidone) .... Take 1 Tablet By Mouth Once A Day At Bedtime 3)  Benadryl 25 Mg Caps (Diphenhydramine Hcl) .... Take 1 Tablet At Bedtime As Needed Insomnia. 4)  Doxycycline Hyclate 100 Mg Tabs (Doxycycline Hyclate) .... Take 1 Tablet By Mouth Two Times A Day With Meals  For 10 Days 5)  Tramadol Hcl 50 Mg Tabs (Tramadol Hcl) .... Take 1 Tablet By Mouth Four Times A Day As Needed  Current Medications (verified): 1)  Glipizide 10 Mg Tabs (Glipizide) .... Take 1 Tablet By Mouth Once A Day 2)  Atenolol-Chlorthalidone 100-25 Mg Tabs (Atenolol-Chlorthalidone) .... Take 1 Tablet By Mouth Once A Day At Bedtime  Allergies (verified): No Known Drug Allergies  Past History:  Family History: Last updated: 06/26/2008 Mom -  DM , HTN, died at 54. Dad - unknown. 4 brothers:  1 brother died of lung cancer, 1 died of stomach cancer, 1 brother died of  heart attack (40's) 2 sisters: 1 sister died (murdered) and the other healthy.  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 married, lives with husband 4 sons - healthy , 1 htn. Cell number : [phone]  Risk Factors: Exercise: yes (05/11/2010)  Risk Factors: Smoking Status: quit (05/11/2010) Passive Smoke Exposure: no (05/11/2010)  Past Medical History: Reviewed history from 07/10/2008  and no changes required. HTN- diagnosed 4 yrs ago. DM- diagnosed 5 yrs ago. Pulmonary Sarcoidosis - She was  treated with Prednisone and some other medication which she couldn't remeber at [facility] for nearly 5 years. Apparently they tappered her Prednisone and oneday radiologist said she has no radiological evidence of Prednisone and since then she stopped following up there. (The entire history is as per patient) Bilateral Diabetic retinopathy.      Past Surgical History: Reviewed history from 06/26/2008 and no changes required. Lleft knee arthroscopy  Family History: Reviewed history from 06/26/2008 and no changes required. Mom - DM , HTN, died at 50. Dad - unknown. 4 brothers:  1 brother died of lung cancer, 1 died of stomach cancer, 1 brother died of  heart attack (40's) 2 sisters: 1 sister died (murdered) and the other healthy.  Social History: Reviewed history from 06/26/2008 and no changes required. 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 married, lives with husband 4 sons - healthy , 1 htn. Cell number : [phone]  Review of Systems      See HPI  Physical Exam  General:  alert, well-developed, and well-hydrated.   Mouth:  good dentition.   Neck:  supple and full ROM.   Lungs:  normal respiratory effort, no intercostal retractions, no accessory muscle use, and normal breath sounds.   Heart:  normal rate, regular rhythm, no murmur, and no gallop.   Abdomen:  soft and normal bowel sounds.   Pulses:  R radial normal.   Neurologic:  alert & oriented X3 and gait normal.     Impression & Recommendations:  Problem # 1:  DM (ICD-250.00) Uncontrolled. She reports taking her glipizide every day. She reports that she lost her strips and hasn't been checking her blood sugars. The last time she checked her blood sugars was about two months ago.Marland Kitchen "I have been eating everything", she reports. She gained  nearly 20 pounds in the last one year. She states that " I am going to better than this". The biggest problem with Wendy [name] has always been compliance. I spent talking/ explaining about the dangers of ncontrolled DM and the need for better compliance. Discussed about increasing her Glipizide to 20 mg but patient resists and states that she will watch her diet and improve her DM. She states that she got the strips and glucometer. I advised her to call the [facility] if her CBG's are more than 300.  She denies any other complaints. Her updated medication list for this problem includes:    Glipizide 10 Mg Tabs (Glipizide) .Marland Kitchen... Take 1 tablet by mouth once a day  Orders: T- Capillary Blood Glucose (82948) T-Hgb A1C (in-house) (16109UE)  Problem # 2:  ESSENTIAL HYPERTENSION, BENIGN (  ICD-401.1) Uncontrolled, suspect secondary to non-compliance.She reports that she takes her BP medications every other day as they are giving her "dreams" she reports that she has been eating more, eating more salt recently. Repeat manual BP is 146/80. No changes made during this visit.   She denies any other complaints. Her updated medication list for this problem includes:    Atenolol-chlorthalidone 100-25 Mg Tabs (Atenolol-chlorthalidone) .Marland Kitchen... Take 1 tablet by mouth once a day at bedtime  Problem # 3:  HYPERLIPIDEMIA (ICD-272.4) Discussed about starting a statin but patient refuses at this point.  Labs Reviewed: SGOT: 19 (10/30/2009)   SGPT: 13 (10/30/2009)  Lipid Goals: LDL Goal: 100 (11/14/2009)     Prior 10 Yr Risk Heart Disease: 15 % (11/14/2009)   HDL:55 (10/30/2009), 48 (11/06/2008)  LDL:135 (10/30/2009), 91 (16/03/9603)  Chol:209 (10/30/2009), 162 (11/06/2008)  Trig:95 (10/30/2009), 114 (11/06/2008)  Complete Medication List: 1)  Glipizide 10 Mg Tabs (Glipizide) .... Take 1 tablet by mouth once a day 2)  Atenolol-chlorthalidone 100-25 Mg Tabs (Atenolol-chlorthalidone) .... Take 1 tablet by mouth  once a day at bedtime  Patient Instructions: 1)  Please schedule a follow-up appointment in 2 months. 2)  It is important that you exercise regularly at least 20 minutes 5 times a week. If you develop chest pain, have severe difficulty breathing, or feel very tired , stop exercising immediately and seek medical attention. 3)  You need to lose weight. Consider a lower calorie diet and regular exercise.  4)  Check your blood sugars regularly. If your readings are usually above : 350 or below 70 you should contact our office.   Orders Added: 1)  T- Capillary Blood Glucose [82948] 2)  T-Hgb A1C (in-house) [83036QW] 3)  Est. Patient Level IV [54098]     Prevention & Chronic Care Immunizations   Influenza vaccine: Not documented   Influenza vaccine deferral: Not indicated  (04/11/2009)   Influenza vaccine due: 02/12/2010    Tetanus booster: Not documented   Td booster deferral: Refused  (05/11/2010)   Tetanus booster due: 01/27/2020    Pneumococcal vaccine: Not documented   Pneumococcal vaccine deferral: Refused  (04/11/2009)  Colorectal Screening   Hemoccult: Not documented   Hemoccult action/deferral: Refused  (01/26/2010)   Hemoccult due: 01/27/2011    Colonoscopy: Not documented   Colonoscopy action/deferral: Refused  (01/26/2010)  Other Screening   Pap smear: Not documented   Pap smear action/deferral: Refused  (01/26/2010)   Pap smear due: 01/27/2011    Mammogram: BI-RADS CATEGORY 3:  Probably benign finding(s) - short interval^MM DIGITAL DIAG LTD R  (01/06/2009)   Mammogram action/deferral: Ordered  (01/26/2010)   Smoking status: quit  (05/11/2010)    Screening comments: patient refuses all the screening modalities. Patient is to get mammogram.  Diabetes Mellitus   HgbA1C: 9.0  (05/11/2010)    Eye exam: Mild non-proliferative diabetic retinopathy.   Clinically significant macular edema.   Cataract.   exam by Heather Burundi, OD Referral recommended to retinal  specialist  (06/04/2008)   Diabetic eye exam action/deferral: Refused  (01/26/2010)   Eye exam due: 09/2008    Foot exam: yes  (01/26/2010)   Foot exam action/deferral: Do today   High risk foot: Yes  (01/26/2010)   Foot care education: Not documented    Urine microalbumin/creatinine ratio: 34.9  (06/26/2008)   Urine microalbumin action/deferral: Ordered   Urine microalbumin/cr due: 01/27/2011  Lipids   Total Cholesterol: 209  (10/30/2009)   LDL: 135  (10/30/2009)   LDL  Direct: Not documented   HDL: 55  (10/30/2009)   Triglycerides: 95  (10/30/2009)   Lipid panel due: 10/31/2010    SGOT (AST): 19  (10/30/2009)   SGPT (ALT): 13  (10/30/2009)   Alkaline phosphatase: 98  (10/30/2009)   Total bilirubin: 0.3  (10/30/2009)  Hypertension   Last Blood Pressure: 142 / 77  (05/11/2010)   Serum creatinine: 1.31  (10/30/2009)   Serum potassium 4.8  (10/30/2009)  Self-Management Support :   Personal Goals (by the next clinic visit) :     Personal A1C goal: 7  (05/11/2010)     Personal blood pressure goal: 130/80  (05/11/2010)     Personal LDL goal: 100  (05/11/2010)    Patient will work on the following items until the next clinic visit to reach self-care goals:     Medications and monitoring: take my medicines every day, check my blood sugar, examine my feet every day  (05/11/2010)     Eating: drink diet soda or water instead of juice or soda, eat more vegetables, use fresh or frozen vegetables, eat foods that are low in salt, eat baked foods instead of fried foods, limit or avoid alcohol  (05/11/2010)     Activity: take a 30 minute walk every day  (01/26/2010)    Diabetes self-management support: Resources for patients handout  (05/11/2010)    Hypertension self-management support: Resources for patients handout  (05/11/2010)    Lipid self-management support: Resources for patients handout  (05/11/2010)     Self-management comments: PLAYS WITH [employer].   Nursing Instructions: Give Flu vaccine today     Last LDL:                                                 135 (10/30/2009 6:29:00 PM)        Diabetic Foot Exam Last Podiatry Exam Date: 01/26/2010    10-g (5.07) Semmes-Weinstein Monofilament Test Performed by: Theotis Barrio NT II          Right Foot          Left Foot Visual Inspection     normal         normal   Laboratory Results   Blood Tests   Date/Time Received: May 11, 2010 1:50 PM Date/Time Reported: Alric Quan  May 11, 2010 1:50 PM   HGBA1C: 9.0%   (Normal Range: Non-Diabetic - 3-6%   Control Diabetic - 6-8%) CBG Random:: 229mg /dL     Appended Document: EST-3 MONTH F/U VISIT/CH   Immunizations Administered:  Influenza Vaccine # 1:    Vaccine Type: Fluvax Non-MCR    Site: right deltoid    Mfr: GlaxoSmithKline    Dose: 0.5 ml    Route: IM    Given by: Angelina Ok RN    Exp. Date: 12/12/2010    Lot #: [id number]    VIS given: 01/06/10 version given May 11, 2010.  Flu Vaccine Consent Questions:    Do you have a history of severe allergic reactions to this vaccine? no    Any prior history of allergic reactions to egg and/or gelatin? no    Do you have a sensitivity to the preservative Thimersol? no    Do you have a past history of Guillan-Barre Syndrome? no    Do you  currently have an acute febrile illness? no    Have you ever had a severe reaction to latex? no    Vaccine information given and explained to patient? yes    Are you currently pregnant? no

## 2010-07-14 NOTE — Letter (Signed)
Summary: LOG BOOK REPORT  LOG BOOK REPORT   Imported By: Margie Billet 11/04/2009 14:08:37  _____________________________________________________________________  External Attachment:    Type:   Image     Comment:   External Document

## 2010-07-14 NOTE — Assessment & Plan Note (Signed)
Summary: acute-high blood sugars add per helen/cfb   Vital Signs:  Patient profile:   60 year old female Height:      64 inches (162.56 cm) Weight:      242.03 pounds (110.01 kg) BMI:     41.69 Temp:     97.7 degrees F (36.50 degrees C) oral Pulse rate:   91 / minute BP sitting:   160 / 88  (right arm)  Vitals Entered By: Angelina Ok RN (Oct 30, 2009 8:40 AM) Is Patient Diabetic? Yes Did you bring your meter with you today? Yes Pain Assessment Patient in pain? yes     Location: shoulder left Intensity: 5 Type: aching Onset of pain  Constant Nutritional Status BMI of > 30 = obese  Have you ever been in a relationship where you felt threatened, hurt or afraid?No   Does patient need assistance? Functional Status Self care Ambulation Normal Comments Rapid heart beat tired all week.  Sugars have been up. No chest pain. Some nausea.  Has been having some headaches.  Stopped her medications for a few days.  Has restarted taking her meds.  Needs refills on meds.  Some shortness of breath.   Primary Care Provider:  Blondell Reveal MD   History of Present Illness: Ms. Wendy [name] comes in today complaining of high CBGs. She has been out of her medication for several weeks. She has chronic left arm and jaw pain- desribes it as a deep throb-worse with movement and upon waking in teh am. No SOB. No CP. She c/o occasional palpitations- "heart racing", but no dizziness or syncope.  Depression History:      The patient denies a depressed mood most of the day and a diminished interest in her usual daily activities.        The patient denies that she feels like life is not worth living, denies that she wishes that she were dead, and denies that she has thought about ending her life.         Preventive Screening-Counseling & Management  Alcohol-Tobacco     Smoking Status: quit     Year Quit: 1977     Passive Smoke Exposure: no  Current Medications (verified): 1)  Glipizide 10 Mg Tabs  (Glipizide) .... Take 1 Tablet By Mouth Once A Day 2)  Atenolol-Chlorthalidone 100-25 Mg Tabs (Atenolol-Chlorthalidone) .... Take 1 Tablet By Mouth Once A Day At Bedtime 3)  Trazodone Hcl 100 Mg Tabs (Trazodone Hcl) .... Take 1 Tablet By Mouth Once A Day At Bedtime  Allergies (verified): No Known Drug Allergies  Past History:  Past Medical History: Last updated: 07/10/2008 HTN- diagnosed 4 yrs ago. DM- diagnosed 5 yrs ago. Pulmonary Sarcoidosis - She was  treated with Prednisone and some other medication which she couldn't remeber at [facility] for nearly 5 years. Apparently they tappered her Prednisone and oneday radiologist said she has no radiological evidence of Prednisone and since then she stopped following up there. (The entire history is as per patient) Bilateral Diabetic retinopathy.      Family History: Last updated: 2008/07/10 Mom - DM , HTN, died at 29. Dad - unknown. 4 brothers:  1 brother died of lung cancer, 1 died of stomach cancer, 1 brother died of  heart attack (40's) 2 sisters: 1 sister died (murdered) and the other healthy.  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  married, lives with husband 4 sons - healthy , 1 htn. Cell number : [phone]  Review of Systems      See HPI  Physical Exam  General:  Well-developed,well-nourished,in no acute distress; alert,appropriate and cooperative throughout examination Lungs:  Normal respiratory effort, chest expands symmetrically. Lungs are clear to auscultation, no crackles or wheezes. Heart:  Normal rate and regular rhythm. S1 and S2 normal without gallop, murmur, click, rub or other extra sounds. Abdomen:  Bowel sounds positive,abdomen soft and non-tender without masses, organomegaly or hernias noted. Msk:  No deformity or scoliosis noted of thoracic or lumbar spine.  She has severely spasmed trapezius  and limited ROM of her left shoulder. She is unable to do teh "back scratch manuever". Multiple thoracic, cervical tender points. Limited ROM rotational of her neck Extremities:  trace left pedal edema and trace right pedal edema. pulses normal bilat.  Neurologic:  alert & oriented X3, cranial nerves II-XII intact, strength normal in all extremities, and sensation intact to light touch.   Skin:  color normal and no rashes.   Psych:  Oriented X3, memory intact for recent and remote, normally interactive, and good eye contact.     Impression & Recommendations:  Problem # 1:  ESSENTIAL HYPERTENSION, BENIGN (ICD-401.1) I am going to completely change her BP regimen today. With her periods of palpitations I think she needs a B-Blocker- and she may be having reflex tachy from the clonidine (possible but unlikely given the dose). She has variable adherence to meds. The clonidine makes her lethargic. She also has some swelling in her legs which may be from the CCB. She developed worsening renal function on ACEI so this was stopped in teh past. Today I will do a combo BB and Diuretic. She will need to come back in 2 weeks for a BP check. Obtained an EKG today which was completely normal -NSR no Blocks or IRHB. Will also check a TSH today.  The following medications were removed from the medication list:    Clonidine Hcl 0.2 Mg Tabs (Clonidine hcl) .Marland Kitchen... Take 1 tablet by mouth once a day at bed time.    Amlodipine Besylate 5 Mg Tabs (Amlodipine besylate) .Marland Kitchen... Take 1 tablet by mouth once a day Her updated medication list for this problem includes:    Atenolol-chlorthalidone 100-25 Mg Tabs (Atenolol-chlorthalidone) .Marland Kitchen... Take 1 tablet by mouth once a day at bedtime  BP today: 160/88 Prior BP: 148/79 (04/11/2009)  Labs Reviewed: K+: 4.7 (01/07/2009) Creat: : 1.07 (01/07/2009)   Chol: 162 (11/06/2008)   HDL: 48 (11/06/2008)   LDL: 91 (11/06/2008)   TG: 114 (11/06/2008)  Problem # 2:  DM (ICD-250.00) I  had an extensive talk with her about starting insulin today. Her CBGs are entirely too high- even on oral meds. She again wants to try anotehr 3 months of meds consistently and see if she can gt her sugars down- she is worried about teh cost of insulin- she also has trouble buying her testing strips. She will need to see Lupita Leash for DM edu.  Her updated medication list for this problem includes:    Glipizide 10 Mg Tabs (Glipizide) .Marland Kitchen... Take 1 tablet by mouth once a day  Orders: T- Capillary Blood Glucose ([id number]) T-Hgb A1C (in-house) ([id number])  Problem # 3:  INSOMNIA (ICD-780.52) Will start a trial of trazadone to see if this improves he sleep.  Problem # 4:  UNSPECIFIED ARTHROPATHY SHOULDER REGION (ICD-716.91) She has significant and severe shoulder and C-Spine  arthropathy associated with pain and muscle spasm. If this continues she will need C-Spine and Shoulder films and then joint injection. Likley is degenerative. No red flag neuro signs to suggest cervical nerve root compression. Since her pain was fairly atypical I am going to start with labs and and EKG to r/o cardiac source although my primary suspicion is msk related. She will need stress testing if this continues given her risk factors.  Complete Medication List: 1)  Glipizide 10 Mg Tabs (Glipizide) .... Take 1 tablet by mouth once a day 2)  Atenolol-chlorthalidone 100-25 Mg Tabs (Atenolol-chlorthalidone) .... Take 1 tablet by mouth once a day at bedtime 3)  Trazodone Hcl 100 Mg Tabs (Trazodone hcl) .... Take 1 tablet by mouth once a day at bedtime  Other Orders: T-Comprehensive Metabolic Panel [id number]) T-Lipid Profile ([phone]) T-CBC w/Diff ([id number]) T-TSH ([id number])  Patient Instructions: 1)  Please schedule a follow-up appointment in 2 weeks. Prescriptions: GLIPIZIDE 10 MG TABS (GLIPIZIDE) Take 1 tablet by mouth once a day  #30 x 1   Entered and Authorized by:   Julaine Fusi  DO   Signed by:   Julaine Fusi   DO on 10/30/2009   Method used:   Electronically to        [facility] [address] (retail)       [phone]       [place], [place]  [address]       Ph: [phone]       Fax: [phone]   RxID:   [id number] TRAZODONE HCL 100 MG TABS (TRAZODONE HCL) Take 1 tablet by mouth once a day at bedtime  #30 x 1   Entered and Authorized by:   Julaine Fusi  DO   Signed by:   Julaine Fusi  DO on 10/30/2009   Method used:   Electronically to        [facility] [address] (retail)       [phone]       [place], [place]  [address]       Ph: [phone]       Fax: [phone]   RxID:   [id number] ATENOLOL-CHLORTHALIDONE 100-25 MG TABS (ATENOLOL-CHLORTHALIDONE) Take 1 tablet by mouth once a day at bedtime  #30 x 1   Entered and Authorized by:   Julaine Fusi  DO   Signed by:   Julaine Fusi  DO on 10/30/2009   Method used:   Electronically to        [facility] [address] (retail)       [phone]       [place], [place]  [address]       Ph: [phone]       Fax: [phone]   RxID:   [id number]   Prevention & Chronic Care Immunizations   Influenza vaccine: Not documented   Influenza vaccine deferral: Not indicated  (04/11/2009)    Tetanus booster: Not documented    Pneumococcal vaccine: Not documented   Pneumococcal vaccine deferral: Refused  (04/11/2009)  Colorectal Screening   Hemoccult: Not documented    Colonoscopy: Not documented  Other Screening   Pap smear: Not documented    Mammogram: BI-RADS CATEGORY 3:  Probably benign finding(s) - short interval^MM DIGITAL DIAG LTD R  (01/06/2009)   Smoking status: quit  (10/30/2009)  Diabetes Mellitus  HgbA1C: 8.8  (10/30/2009)    Eye exam: Mild non-proliferative diabetic retinopathy.   Clinically significant macular edema.   Cataract.   exam by Heather Burundi, OD Referral recommended to retinal  specialist  (06/04/2008)   Eye exam due: 09/2008    Foot exam: Not documented   High risk foot: Not documented   Foot care education: Not documented    Urine microalbumin/creatinine ratio: 34.9  (06/26/2008)  Lipids   Total Cholesterol: 162  (11/06/2008)   LDL: 91  (11/06/2008)   LDL Direct: Not documented   HDL: 48  (11/06/2008)   Triglycerides: 114  (11/06/2008)    SGOT (AST): 17  (11/06/2008)   SGPT (ALT): 13  (11/06/2008) CMP ordered    Alkaline phosphatase: 68  (11/06/2008)   Total bilirubin: 0.4  (11/06/2008)  Hypertension   Last Blood Pressure: 160 / 88  (10/30/2009)   Serum creatinine: 1.07  (01/07/2009)   Serum potassium 4.7  (01/07/2009) CMP ordered   Self-Management Support :    Patient will work on the following items until the next clinic visit to reach self-care goals:     Medications and monitoring: take my medicines every day, check my blood sugar, bring all of my medications to every visit  (10/30/2009)     Eating: drink diet soda or water instead of juice or soda, eat more vegetables, eat foods that are low in salt, eat baked foods instead of fried foods, eat fruit for snacks and desserts, limit or avoid alcohol  (10/30/2009)     Activity: take a 30 minute walk every day  (10/30/2009)    Diabetes self-management support: Copy of home glucose meter record, Written self-care plan, Education handout, Pre-printed educational material  (10/30/2009)   Diabetes care plan printed   Diabetes education handout printed    Hypertension self-management support: Education handout, Pre-printed educational material, Written self-care plan  (10/30/2009)   Hypertension self-care plan printed.   Hypertension education handout printed    Lipid self-management support: Written self-care plan, Education handout, Pre-printed educational material  (10/30/2009)   Lipid self-care plan printed.   Lipid education handout printed  Process Orders Check Orders Results:      [facility]: ABN not required for this insurance Tests Sent for requisitioning (Oct 31, 2009 9:13 PM):     10/30/2009: [facility] -- T-Comprehensive Metabolic Panel [[id number]] (signed)     10/30/2009: [facility] -- T-Lipid Profile [id number] (signed)     10/30/2009: [facility] -- T-CBC w/Diff [[id number]] (signed)     10/30/2009: [facility] -- T-TSH [phone] (signed)     Vital Signs:  Patient profile:   60 year old female Height:      64 inches (162.56 cm) Weight:      242.03 pounds (110.01 kg) BMI:     41.69 Temp:     97.7 degrees F (36.50 degrees C) oral Pulse rate:   91 / minute BP sitting:   160 / 88  (right arm)  Vitals Entered By: Angelina Ok RN (Oct 30, 2009 8:40 AM)    Laboratory Results   Blood Tests   Date/Time Received: Burke Keels  Oct 30, 2009 8:59 AM  Date/Time Reported: Burke Keels  Oct 30, 2009 8:59 AM   HGBA1C: 8.8%   (Normal Range: Non-Diabetic - 3-6%   Control Diabetic - 6-8%) CBG Fasting:: 218mg /dL

## 2010-07-14 NOTE — Progress Notes (Signed)
Summary: hi cbg/ hla  Phone Note Call from Patient   Summary of Call: pt calls and requests refills on meds and an appt, states she does not take her meds the way she has been instructed and blood sugar has been 300-444 for at least 3 days now, she feels tired. appt is scheduled for 5/19 at 0830 w/ dr Phillips Odor and pt is instructed to be npo after mn. she is also instructed on s/s of hyperglycemia, weakness, extreme thirst etc and instructed if any concerns between now and appt time to go to ED Initial call taken by: Marin Roberts RN,  Oct 29, 2009 5:05 PM

## 2010-07-24 ENCOUNTER — Encounter: Payer: Self-pay | Admitting: Internal Medicine

## 2010-07-24 ENCOUNTER — Ambulatory Visit (INDEPENDENT_AMBULATORY_CARE_PROVIDER_SITE_OTHER): Payer: Self-pay | Admitting: Internal Medicine

## 2010-07-24 ENCOUNTER — Ambulatory Visit: Payer: Self-pay | Admitting: Internal Medicine

## 2010-07-24 ENCOUNTER — Other Ambulatory Visit: Payer: Self-pay | Admitting: Internal Medicine

## 2010-07-24 VITALS — BP 144/81 | HR 80 | Temp 97.9°F | Ht 64.0 in | Wt 256.2 lb

## 2010-07-24 DIAGNOSIS — E119 Type 2 diabetes mellitus without complications: Secondary | ICD-10-CM

## 2010-07-24 LAB — GLUCOSE, POCT (MANUAL RESULT ENTRY): POC Glucose: 258

## 2010-07-24 MED ORDER — GLIPIZIDE 10 MG PO TABS: 10.0000 mg | ORAL_TABLET | Freq: Every day | ORAL | Status: DC

## 2010-07-24 MED ORDER — ATENOLOL-CHLORTHALIDONE 100-25 MG PO TABS: 1.0000 | ORAL_TABLET | Freq: Every day | ORAL | Status: DC

## 2010-07-24 NOTE — Progress Notes (Signed)
  Subjective:    Patient ID: Antony Haste, female    DOB: [birth year], 60 y.o.   MRN: [id number]  HPI Patient here on f/u for DM, HTN. Has medications with her, keeps up to date with refills  Not getting any exercise AIC is 9.2-trends stable and high we reviewed goals-she plans to exercise at her fitness center 4/wk 30 minutes per session Also wants to work a bit more on diet.     Review of Systems Occn pains in feet and ankles, feels run down. No angina or claudication   Objective:   Physical Exam  Constitutional: She is oriented to person, place, and time. She appears well-developed and well-nourished.  Cardiovascular: Normal rate and regular rhythm.   Abdominal: Soft. Bowel sounds are normal. She exhibits no distension. There is no tenderness. There is no rebound.  Musculoskeletal: She exhibits edema and tenderness.  Neurological: She is alert and oriented to person, place, and time.  Skin:       Has small corns on lat aspect of 5th toes bilat Sensation normal  Psychiatric: She has a normal mood and affect. Her behavior is normal.          Assessment & Plan:

## 2010-08-25 LAB — GLUCOSE, CAPILLARY: Glucose-Capillary: 229 mg/dL — ABNORMAL HIGH (ref 70–99)

## 2010-09-18 LAB — GLUCOSE, CAPILLARY: Glucose-Capillary: 213 mg/dL — ABNORMAL HIGH (ref 70–99)

## 2010-09-20 LAB — GLUCOSE, CAPILLARY: Glucose-Capillary: 194 mg/dL — ABNORMAL HIGH (ref 70–99)

## 2010-09-24 LAB — GLUCOSE, CAPILLARY: Glucose-Capillary: 245 mg/dL — ABNORMAL HIGH (ref 70–99)

## 2010-09-28 LAB — GLUCOSE, CAPILLARY
Glucose-Capillary: 279 mg/dL — ABNORMAL HIGH (ref 70–99)
Glucose-Capillary: 154 mg/dL — ABNORMAL HIGH (ref 70–99)

## 2011-02-12 ENCOUNTER — Other Ambulatory Visit: Payer: Self-pay | Admitting: Internal Medicine

## 2011-03-25 LAB — I-STAT 8, (EC8 V) (CONVERTED LAB)
BUN: 11
Bicarbonate: 27.2 — ABNORMAL HIGH
Glucose, Bld: 265 — ABNORMAL HIGH
Hemoglobin: 15.3 — ABNORMAL HIGH
pCO2, Ven: 38.2 — ABNORMAL LOW

## 2011-06-17 ENCOUNTER — Encounter (HOSPITAL_COMMUNITY): Payer: Self-pay | Admitting: Emergency Medicine

## 2011-06-17 ENCOUNTER — Emergency Department (INDEPENDENT_AMBULATORY_CARE_PROVIDER_SITE_OTHER): Payer: Self-pay

## 2011-06-17 ENCOUNTER — Emergency Department (INDEPENDENT_AMBULATORY_CARE_PROVIDER_SITE_OTHER)
Admission: EM | Admit: 2011-06-17 | Discharge: 2011-06-17 | Disposition: A | Discharge status: Home / Self Care | Payer: Self-pay | Source: Home / Self Care | Attending: Family Medicine | Admitting: Family Medicine

## 2011-06-17 DIAGNOSIS — T07XXXA Unspecified multiple injuries, initial encounter: Secondary | ICD-10-CM

## 2011-06-17 DIAGNOSIS — I10 Essential (primary) hypertension: Secondary | ICD-10-CM

## 2011-06-17 HISTORY — DX: Essential (primary) hypertension: I10

## 2011-06-17 HISTORY — DX: Sarcoidosis, unspecified: D86.9

## 2011-06-17 NOTE — ED Notes (Signed)
Reports falling on 06/16/2011.  Right leg pain, left knee pain, c/o lower back 'discomfort" , left shoulder

## 2011-06-17 NOTE — ED Provider Notes (Signed)
History     CSN: [id number]  Arrival date & time 06/17/11  1612   First MD Initiated Contact with Patient 06/17/11 1628      Chief Complaint  Patient presents with  . Fall    (Consider location/radiation/quality/duration/timing/severity/associated sxs/prior treatment) Patient is a 61 y.o. female presenting with fall. The history is provided by the patient.  Fall The accident occurred yesterday. The fall occurred while walking (tripped and fell at [employer] clothes rack.). She landed on a hard floor. There was no blood loss. The point of impact was the right knee. The pain is present in the right knee. The pain is moderate. She was ambulatory at the scene. Associated symptoms comments: Multiple sore areas, contusions, no abrasions or lac.Marland Kitchen    Past Medical History  Diagnosis Date  . Sarcoidosis   . Hypertension   . Diabetes mellitus     History reviewed. No pertinent past surgical history.  No family history on file.  History  Substance Use Topics  . Smoking status: Never Smoker   . Smokeless tobacco: Not on file  . Alcohol Use: No    OB History    Grav Para Term Preterm Abortions TAB SAB Ect Mult Living                  Review of Systems  Constitutional: Negative.   HENT: Negative.   Respiratory: Negative.   Gastrointestinal: Negative.   Musculoskeletal: Positive for myalgias, back pain and gait problem.  Skin: Negative.   Neurological: Negative.     Allergies  Metformin and related  Home Medications   Current Outpatient Rx  Name Route Sig Dispense Refill  . ATENOLOL-CHLORTHALIDONE 100-25 MG PO TABS  TAKE ONE TABLET BY MOUTH AT BEDTIME 30 tablet 6  . GLIPIZIDE 10 MG PO TABS Oral Take 1 tablet (10 mg total) by mouth daily. 30 tablet 11    BP 135/90  Pulse 69  Temp(Src) 98 F (36.7 C) (Oral)  Resp 16  SpO2 100%  Physical Exam  Nursing note and vitals reviewed. Constitutional: She is oriented to person, place, and time. She appears well-developed  and well-nourished.  HENT:  Head: Normocephalic and atraumatic.  Neck: Normal range of motion. Neck supple.  Abdominal: Soft. Bowel sounds are normal.  Musculoskeletal: She exhibits tenderness.       Back:       Legs: Neurological: She is alert and oriented to person, place, and time.  Skin: Skin is warm and dry.  Psychiatric: She has a normal mood and affect.    ED Course  Procedures (including critical care time)  Labs Reviewed - No data to display Dg Tibia/fibula Right  06/17/2011  *RADIOLOGY REPORT*  Clinical Data: Pain, swelling, hematoma, fall  RIGHT TIBIA AND FIBULA - 2 VIEW  Comparison: None  Findings: Osseous mineralization normal. Medial compartment joint space narrowing and marginal spur formation right knee. No acute fracture, dislocation, or bone destruction.  IMPRESSION: No acute bony abnormalities. Degenerative changes right knee.  Original Report Authenticated By: Lollie Marrow, M.D.     1. Contusion of multiple sites       MDM  X-rays reviewed and report per radiologist.         Barkley Bruns, MD 06/17/11 [phone]

## 2011-07-20 ENCOUNTER — Encounter: Payer: Self-pay | Admitting: Internal Medicine

## 2011-08-10 ENCOUNTER — Encounter: Payer: Self-pay | Admitting: Internal Medicine

## 2011-08-10 ENCOUNTER — Ambulatory Visit (INDEPENDENT_AMBULATORY_CARE_PROVIDER_SITE_OTHER): Payer: Self-pay | Admitting: Internal Medicine

## 2011-08-10 VITALS — BP 144/76 | HR 74 | Temp 98.3°F | Ht 64.0 in | Wt 247.4 lb

## 2011-08-10 DIAGNOSIS — I10 Essential (primary) hypertension: Secondary | ICD-10-CM

## 2011-08-10 DIAGNOSIS — Z79899 Other long term (current) drug therapy: Secondary | ICD-10-CM

## 2011-08-10 DIAGNOSIS — E119 Type 2 diabetes mellitus without complications: Secondary | ICD-10-CM

## 2011-08-10 DIAGNOSIS — Z23 Encounter for immunization: Secondary | ICD-10-CM

## 2011-08-10 DIAGNOSIS — E785 Hyperlipidemia, unspecified: Secondary | ICD-10-CM | POA: Insufficient documentation

## 2011-08-10 DIAGNOSIS — E782 Mixed hyperlipidemia: Secondary | ICD-10-CM | POA: Insufficient documentation

## 2011-08-10 HISTORY — DX: Mixed hyperlipidemia: E78.2

## 2011-08-10 LAB — LIPID PANEL
HDL: 48 mg/dL (ref >39)
LDL Cholesterol: 174 mg/dL — ABNORMAL HIGH (ref 0–99)
VLDL: 24 mg/dL (ref 0–40)

## 2011-08-10 LAB — COMPLETE METABOLIC PANEL WITH GFR
ALT: 9 U/L (ref 0–35)
CO2: 27 mEq/L (ref 19–32)
Chloride: 103 mEq/L (ref 96–112)
GFR, Est African American: 44 mL/min — ABNORMAL LOW
Sodium: 139 mEq/L (ref 135–145)
Total Bilirubin: 0.3 mg/dL (ref 0.3–1.2)
Total Protein: 7.2 g/dL (ref 6.0–8.3)

## 2011-08-10 LAB — GLUCOSE, CAPILLARY: Glucose-Capillary: 260 mg/dL — ABNORMAL HIGH (ref 70–99)

## 2011-08-10 MED ORDER — LISINOPRIL-HYDROCHLOROTHIAZIDE 20-25 MG PO TABS: 1.0000 | ORAL_TABLET | Freq: Every day | ORAL | Status: DC

## 2011-08-10 MED ORDER — GLIPIZIDE 10 MG PO TABS: 10.0000 mg | ORAL_TABLET | Freq: Every day | ORAL | Status: DC

## 2011-08-10 MED ORDER — METFORMIN HCL 500 MG PO TABS: 500.0000 mg | ORAL_TABLET | Freq: Every day | ORAL | Status: DC

## 2011-08-10 NOTE — Progress Notes (Signed)
HPI: Wendy [name] is a 61 yo W with PMH of uncontrolled DM II, HTN, HLP presents today for follow up.  She has not been seen in the office for over a year.  She has been taking Glipizide 10mg  po qd for her diabetes and has not been checking her sugars at home because her meter broke and she cannot afford another meter since she does not have medical insurance.  She was on Metformin in the past; however, she stopped taking because she felt nauseous.  She states that she does not have high cholesterol even though her lipid panel in 2011 showed LDL of 135.  She does not want to be on statins at this time and prefer not to be on it at all due to all the negative side effects that she heard.  She actually does not want to take a lot of medications because lack of financial problems.  She does have occasional numbness and tingling in her hands and feet.  She also has corns on bilateral feet in which she wants to be referred to podiatry because it affects her walking.  She has DM retinopathy in which she follows with an opthalmologist less than 1 year ago.  No other complaints today.  ROS: as per HPI  PE: General: alert, well-developed, and cooperative to examination.  Lungs: normal respiratory effort, no accessory muscle use, normal breath sounds, no crackles, and no wheezes. Heart: normal rate, regular rhythm, no murmur, no gallop, and no rub.  Abdomen: soft, non-tender, normal bowel sounds, no distention, no guarding, no rebound tenderness, Msk: no joint swelling, no joint warmth, and no redness over joints.  Pulses: 2+ DP/PT pulses bilaterally Extremities: No cyanosis, clubbing, edema, no ulcers noted Neurologic: alert & oriented X3, cranial nerves II-XII intact, strength normal in all extremities, sensation intact to light touch, and gait normal.  Skin: turgor normal and no rashes.  Psych: Oriented X3, memory intact for recent and remote, normally interactive, good eye contact, not anxious appearing, and  not depressed appearing.

## 2011-08-10 NOTE — Assessment & Plan Note (Addendum)
Not well controlled.  Her LDL was 135 in 10/2009.  She has not been on a statin and does not want to be on it at this time and she was very adamant about it.  She is worried about side effects and also she does not like to take a lot of medications at this time because she does not have insurance. -I repeat lipid panel today -strongly encourage her to take statin if she can tolerate because of complications such as MI, strokes.. -need to be on low dose aspirin    ADDENDUM: 08/12/11  As for her lipid panel, her LDL is 174 which is worse than previous results.  I emphasized the importance of taking a statin or at least some type of anti-cholesterol medications due to risks of stroke, MIs, PAD.  Again, she tells me she does not want to start at this time.  I instructed her to continue diet and exercise and will re-visit statins issue next office visit in 2-3 weeks.

## 2011-08-10 NOTE — Assessment & Plan Note (Addendum)
Not at goal which should be less than 130/80.  She has been on atenolol-chlothalidone for a long time.  I think given her uncontrolled DM with  micro-albumin/Cr ratio of 34.9, she would benefit more with a combination pill that has ACEi. -d/c atenolol-chlothalidone -Start Lisinopril/hctz 20/25mg  po qd -recheck BP in 2-3 weeks

## 2011-08-10 NOTE — Patient Instructions (Signed)
Start taking Metformin 500mg  one tablet daily, if you cannot tolerate it, you can try one tablet every other day -Stop taking atenolol-chlorthalidone -Start taking Lisinopril/hydrochlorothiazide 20/25mg  one tablet daily -Continue taking Glipizide 10mg  one tablet daily -Start diet and exercise -Please check your blood sugar at least 2 times daily -Will refer to podiatrist -Make sure you follow up with opthalmologist  -Get labs today and we will call you with any abnormal lab results -Follow up with Dr. Anselm Jungling in 2-4 weeks

## 2011-08-10 NOTE — Assessment & Plan Note (Addendum)
Poorly controlled. HbA1c today was 9.3.  She has always been on Glipizide 10mg  qd.  She tried Metformin in the past but she stopped because of nausea.  I explained to her about several options including insulin and retrying Metformin again but she does not want insulin at this time since she cannot afford it.  She is willing to try metformin at a lower dose. -Metformin 500mg  po qAM or every other day if she cannot tolerate it (knowing that the lower dose will not be as effective as the full dose Metformin, but with her situation, I am very limited with options as she also does not like to take a lot of medications in general).  She does have chronic CKD with baseline Cr of 1.3, if her repeat BMP today shows Cr above 1.4, she would need to stop metformin since that is the blackbox warning.   -She states that she will try to exercise and eat better diet -Continue Glipizide 10mg  qd, as doubling the dose at this time is unlikely to give her more benefits with her HbA1c.  -Refer to podiatry for corns on her feet.  Feet exam today showed DP pulse of 2+ bilaterally with normal sensation. No ulcers noted -Last seen opthalmology less than 1 yr ago -Add ACEi for renal protection especially her micro alb/Cr was 34.9 in 2011.  I elected not get another urine micro cr/alb today because patient cannot afford to pay out of pocket. -Check BMP to make sure her Cr is at baseline (1.3) -Will repeat HbA1c in 3 months  -I discussed case with attending Dr. Aundria Rud.  ADDENDUM 08/12/11 Cr is above 1.4, I spoke to patient and instructed her to stop taking Metformin.  She states that she has not started taking it anyway.  I think she needs some insulin to lower her HbA1c.  She says she will think about it and tell me next office visit.

## 2011-09-03 ENCOUNTER — Encounter: Payer: Self-pay | Admitting: Internal Medicine

## 2011-09-21 ENCOUNTER — Telehealth: Payer: Self-pay | Admitting: *Deleted

## 2011-09-21 NOTE — Telephone Encounter (Signed)
If her BP is 122/77 w/o Lisinopril then she can stop taking it and then we will reassess her next office visit.

## 2011-09-21 NOTE — Telephone Encounter (Signed)
Pt called stating she was put on a med for HTN and it caused a lot of complications.  She stopped taking it a few days ago and she wants a new med called in for her. Pt # [phone]  Pt called back to get more info. No answer, message left for a return call.

## 2011-09-21 NOTE — Telephone Encounter (Signed)
Pt called back and states the lisinopril caused her to  cough all the time and SOB. She tried taking it for 3 weeks. She feels better now that she is stopped the med.  Today BP  122/77.  Please advise

## 2011-09-22 NOTE — Telephone Encounter (Signed)
Pt informed and will call if BP changes

## 2011-09-30 ENCOUNTER — Ambulatory Visit (INDEPENDENT_AMBULATORY_CARE_PROVIDER_SITE_OTHER): Payer: Self-pay | Admitting: Internal Medicine

## 2011-09-30 ENCOUNTER — Encounter: Payer: Self-pay | Admitting: Internal Medicine

## 2011-09-30 VITALS — BP 186/96 | HR 86 | Wt 251.7 lb

## 2011-09-30 DIAGNOSIS — E785 Hyperlipidemia, unspecified: Secondary | ICD-10-CM

## 2011-09-30 DIAGNOSIS — E1122 Type 2 diabetes mellitus with diabetic chronic kidney disease: Secondary | ICD-10-CM

## 2011-09-30 DIAGNOSIS — E119 Type 2 diabetes mellitus without complications: Secondary | ICD-10-CM

## 2011-09-30 DIAGNOSIS — I10 Essential (primary) hypertension: Secondary | ICD-10-CM

## 2011-09-30 DIAGNOSIS — D860 Sarcoidosis of lung: Secondary | ICD-10-CM | POA: Insufficient documentation

## 2011-09-30 DIAGNOSIS — I129 Hypertensive chronic kidney disease with stage 1 through stage 4 chronic kidney disease, or unspecified chronic kidney disease: Secondary | ICD-10-CM

## 2011-09-30 DIAGNOSIS — R0602 Shortness of breath: Secondary | ICD-10-CM

## 2011-09-30 LAB — POCT URINALYSIS DIPSTICK
Bilirubin, UA: NEGATIVE
Blood, UA: NEGATIVE
Ketones, UA: NEGATIVE
pH, UA: 5

## 2011-09-30 MED ORDER — ATORVASTATIN CALCIUM 10 MG PO TABS: 10.0000 mg | ORAL_TABLET | Freq: Every day | ORAL | Status: DC

## 2011-09-30 MED ORDER — HYDROCHLOROTHIAZIDE 25 MG PO TABS: 25.0000 mg | ORAL_TABLET | Freq: Every day | ORAL | Status: DC

## 2011-09-30 MED ORDER — METFORMIN HCL 1000 MG PO TABS: 1000.0000 mg | ORAL_TABLET | Freq: Two times a day (BID) | ORAL | Status: DC

## 2011-09-30 NOTE — Patient Instructions (Signed)
Please, note a change in the dose of Metformin: start taking 1000 mg one tablet by mouth twice a day with meals. Please, start taking HCTZ for your high blood pressure as prescribed. Please, bring your glucometer with you and all your medications with each visit. Please, call with any questions and follow up with Dr. Anselm Jungling in 4-6 weeks. Happy Spring!!!!

## 2011-09-30 NOTE — Progress Notes (Signed)
Patient ID: Wendy [name], female   DOB: 01-08-1951, 61 y.o.   MRN: [id number] HPI:    1. SOB on exertion for several months. Patient states that it was somewhat improved with a discontinuation of Lisinopril by Dr. Anselm Jungling 1 month ago. She denies any CP, orthopnea, PND, leg swelling, palpitations, dizziness or any other Sx.  Review of Systems: Negative except per history of present illness  Physical Exam:  Nursing notes and vitals reviewed General:  obese, alert, well-developed, and cooperative to examination.   Lungs:  normal respiratory effort, no accessory muscle use, normal breath sounds, no crackles, and no wheezes. Heart:  normal rate, regular rhythm, no murmurs, no gallop, and no rub.   Abdomen:  soft, non-tender, normal bowel sounds, no distention, no guarding, no rebound tenderness, no hepatomegaly, and no splenomegaly.   Extremities:  No cyanosis, clubbing, edema Neurologic:  alert & oriented X3, nonfocal exam  Meds: Medications Prior to Admission  Medication Sig Dispense Refill  . glipiZIDE (GLUCOTROL) 10 MG tablet Take 1 tablet (10 mg total) by mouth daily.  30 tablet  11  . metFORMIN (GLUCOPHAGE) 500 MG tablet Take 1 tablet (500 mg total) by mouth daily with breakfast.  30 tablet  6  . hydrochlorothiazide (HYDRODIURIL) 25 MG tablet Take 1 tablet (25 mg total) by mouth daily.  30 tablet  11   No current facility-administered medications on file as of 09/30/2011.    Allergies: Metformin and related Past Medical History  Diagnosis Date  . Sarcoidosis   . Hypertension   . Diabetes mellitus    No past surgical history on file. No family history on file. History   Social History  . Marital Status: Married    Spouse Name: N/A    Number of Children: N/A  . Years of Education: N/A   Occupational History  . Not on file.   Social History Main Topics  . Smoking status: Former Smoker    Quit date: 08/09/1976  . Smokeless tobacco: Not on file  . Alcohol Use: No  . Drug  Use: No  . Sexually Active: Not on file   Other Topics Concern  . Not on file   Social History Narrative  . No narrative on file    A/P: 1. HTN, poorly controlled. -Patient was taken off Zestoretic in March of 2013 due to cough -patient states that she "did not need any BP medications because she does not have high blood pressure." Patient became passive aggressive in her demeanor when I demonstrated her a VS flow sheet graph with her BP' readings being consistently being elevated above target range. - Start HCTZ - Weight management : diet exercise discussed (patient's weight is 251 lbs) --refused referal for an eye exam due to financial reasons  2. CKD, stage III -patient states "that Dr. Anselm Jungling already check my kidneys and told her that she is fine." I counseled the patient on  Etiology, treatment and risks of kidney disease -urine micro-albumine and UA today -consider to start non-dihydropyridines or ARB  3. DM, type 2 -referred for DME -refused referral for an eye exam due to financial reasons - refused insulin therapy. "She will think about it."  4. SOB, slightly improved with a discontinuation of lisinopril. -DDx include but not limited to pulm HTN, sarcoidoses, CAD vs CHF -EKG is reviewed and is unremarkable -patient refused CXR, PSM[facility], pro-BNP due to a financial reasons -I instructed to go to Ed and/or call 911 if dyspnea worsens.  5. HLD -  patient refused "any statin because [employer] said that it is bad for you." Patient declined any other alternative such as niacin and/or zetia -Low cholesterol diet and exercise discussed -repeat FLP in 6-12 months  7. 30 min discussion on risks of uncontrolled DM, HTN, and HLD in a setting of obesity. Patient verbalized comprehension.  8. F/U in 1 month with her PCP Dr. Anselm Jungling.

## 2011-10-01 LAB — MICROALBUMIN / CREATININE URINE RATIO
Microalb Creat Ratio: 46.9 mg/g — ABNORMAL HIGH (ref 0.0–30.0)
Microalb, Ur: 6.3 mg/dL — ABNORMAL HIGH (ref 0.00–1.89)

## 2011-10-07 ENCOUNTER — Telehealth: Payer: Self-pay | Admitting: *Deleted

## 2011-10-07 NOTE — Telephone Encounter (Signed)
Pt called asking for a chest xray to be ordered. She was seen in clinic on 4/18 and refused CXR at that time.  Will you order now?

## 2011-10-08 ENCOUNTER — Other Ambulatory Visit: Payer: Self-pay | Admitting: Internal Medicine

## 2011-10-08 ENCOUNTER — Ambulatory Visit (HOSPITAL_COMMUNITY)
Admission: RE | Admit: 2011-10-08 | Discharge: 2011-10-08 | Disposition: A | Discharge status: Home / Self Care | Payer: Self-pay | Source: Ambulatory Visit | Attending: Internal Medicine | Admitting: Internal Medicine

## 2011-10-08 DIAGNOSIS — R0602 Shortness of breath: Secondary | ICD-10-CM | POA: Insufficient documentation

## 2011-10-08 DIAGNOSIS — R0600 Dyspnea, unspecified: Secondary | ICD-10-CM

## 2011-10-08 NOTE — Telephone Encounter (Signed)
CXR ordered. Thank you

## 2011-10-08 NOTE — Telephone Encounter (Signed)
Pt informed and will get the xray done

## 2011-10-28 ENCOUNTER — Encounter: Payer: Self-pay | Admitting: Dietician

## 2011-11-09 ENCOUNTER — Encounter: Payer: Self-pay | Admitting: Internal Medicine

## 2011-11-09 ENCOUNTER — Ambulatory Visit (INDEPENDENT_AMBULATORY_CARE_PROVIDER_SITE_OTHER): Payer: Self-pay | Admitting: Internal Medicine

## 2011-11-09 VITALS — BP 148/80 | HR 87 | Temp 99.1°F | Wt 248.9 lb

## 2011-11-09 DIAGNOSIS — E119 Type 2 diabetes mellitus without complications: Secondary | ICD-10-CM

## 2011-11-09 DIAGNOSIS — Z79899 Other long term (current) drug therapy: Secondary | ICD-10-CM

## 2011-11-09 DIAGNOSIS — I10 Essential (primary) hypertension: Secondary | ICD-10-CM

## 2011-11-09 DIAGNOSIS — E785 Hyperlipidemia, unspecified: Secondary | ICD-10-CM

## 2011-11-09 LAB — POCT GLYCOSYLATED HEMOGLOBIN (HGB A1C): Hemoglobin A1C: 9.6

## 2011-11-09 NOTE — Assessment & Plan Note (Signed)
BP 148/80. Better control but not at goal. Our goal is less than 130/80 in this diabetic patient.  Patient does not want to take another blood pressure medication at this time. She states that she will limit her salt intake and will try to exercise. -Continue hydrochlorothiazide 25 mg by mouth daily

## 2011-11-09 NOTE — Progress Notes (Signed)
HPI: Wendy [name] is a 61 yo W with PMH of uncontrolled DM II, HTN, HLP presents today for follow up.  Last office visit, her hemoglobin A1c was 9.2. She was on Metformin in the past; however, she stopped taking because she felt nauseous. She reports that she has not been eating appropriately and that she has been taking a lot of salt, drinking sweet tea and has gained weight since February approximately 2 pounds.  Patient was previously placed on lisinopril in which she developed shortness of breath no dry cough therefore was taken off and she reports that her shortness of breath is much improved. She actually does not want to take a lot of medications because lack of financial problems and that she just does not want to take medication. She does not want to be on insulin at this time and states that she will consider it after she gets a job. She will try to work on her diet and exercise. She does not want to start any new medication today. Although, she reports taking ASA 81mg  po qd  ROS: as per HPI   PE:  General: alert, well-developed, and cooperative to examination.  Lungs: normal respiratory effort, no accessory muscle use, normal breath sounds, no crackles, and no wheezes. Heart: normal rate, regular rhythm, no murmur, no gallop, and no rub.  Abdomen: soft, non-tender, normal bowel sounds, no distention, no guarding, no rebound tenderness,  Msk: no joint swelling, no joint warmth, and no redness over joints.  Pulses: 2+ DP/PT pulses bilaterally Extremities: No cyanosis, clubbing, edema, no ulcers noted Neurologic: alert & oriented X3, cranial nerves II-XII intact, strength normal in all extremities, sensation intact to light touch, and gait normal.

## 2011-11-09 NOTE — Assessment & Plan Note (Signed)
Hemoglobin A1c today is 9.6 which is up from 9.2 in February of 2013. Her CBGs today was 306. Patient is not checking her blood sugar at home nor does she want to start on insulin. Patient is not a candidate for metformin given her nausea side effects as well as her most recent creatinine of 1.47. Again I reemphasized the complications of diabetes if she does not control her hemoglobin A1c.  She verbalized her understanding and states that she is not mentally ready for insulin as well as financially. -Will continue glipizide 10 mg by mouth daily -Encourage diet and exercise -Will continue to discuss insulin at next office visit -Will consider adding an ARB since her urine micral albumin creatinine ratio is 47 one month ago.  However it is difficult to add important medications to her regimen since patient is reluctant to take more medication. I also think that patient is also in denial and does not accept that she has medical problems such as diabetes, hypertension and hyperlipidemia.

## 2011-11-09 NOTE — Assessment & Plan Note (Signed)
LDL is 174 however patient refused to be on a statin at this time. Will continue to readdress the issue at next office visit.

## 2011-11-09 NOTE — Patient Instructions (Signed)
Please work on your diet and exercise  Please limit salt intake- less than 2g per day, do not drink soda, or sweet tea.   Follow up in 3 months with Dr. Anselm Jungling

## 2011-11-23 ENCOUNTER — Encounter: Payer: Self-pay | Admitting: Internal Medicine

## 2011-11-23 ENCOUNTER — Ambulatory Visit: Payer: Self-pay | Admitting: Dietician

## 2012-01-11 ENCOUNTER — Encounter: Payer: Self-pay | Admitting: Dietician

## 2012-01-11 ENCOUNTER — Encounter: Payer: Self-pay | Admitting: Internal Medicine

## 2012-02-08 ENCOUNTER — Encounter: Payer: Self-pay | Admitting: Internal Medicine

## 2012-02-08 ENCOUNTER — Encounter: Payer: Self-pay | Admitting: Dietician

## 2012-02-08 NOTE — Addendum Note (Signed)
Addended by: Neomia Dear on: 02/08/2012 06:15 PM   Modules accepted: Orders

## 2012-03-10 ENCOUNTER — Encounter: Payer: Self-pay | Admitting: Internal Medicine

## 2012-03-10 ENCOUNTER — Ambulatory Visit (INDEPENDENT_AMBULATORY_CARE_PROVIDER_SITE_OTHER): Payer: Self-pay | Admitting: Internal Medicine

## 2012-03-10 VITALS — BP 146/86 | HR 79 | Wt 242.9 lb

## 2012-03-10 DIAGNOSIS — E119 Type 2 diabetes mellitus without complications: Secondary | ICD-10-CM

## 2012-03-10 DIAGNOSIS — E785 Hyperlipidemia, unspecified: Secondary | ICD-10-CM

## 2012-03-10 DIAGNOSIS — Z79899 Other long term (current) drug therapy: Secondary | ICD-10-CM

## 2012-03-10 DIAGNOSIS — I10 Essential (primary) hypertension: Secondary | ICD-10-CM

## 2012-03-10 LAB — GLUCOSE, CAPILLARY: Glucose-Capillary: 235 mg/dL — ABNORMAL HIGH (ref 70–99)

## 2012-03-10 LAB — POCT GLYCOSYLATED HEMOGLOBIN (HGB A1C): Hemoglobin A1C: 9

## 2012-03-10 NOTE — Patient Instructions (Addendum)
Continue current regimen Continue diet and exercise Get labs today and we will call you with any  Abnormal labs Follow up in 3 months

## 2012-03-10 NOTE — Assessment & Plan Note (Addendum)
HbA1c improves slightly from 9.6 to 9.0 today.  She has been trying to loose weight. Our record show she has lost 6 pounds since May.  She is in denial and does not want to increase her Glipizide or starting insulin.  She has GI side effects to Metformin.  Feet exam wnl.  Up to date with eye exam- has follow up appt at [facility] next week. Again, we discussed the risks of stroke, MI, kidney failure, microvascular complications with her but she is adamant about not changing her regimen at this time and will try to control it with diet and exercise.   -Continue GLipizide 10mg  QD -Check Urinalysis-c/o of foamy urine - urine microalb/Cr ratio: 46 in 09/2011, Should be on ACEi/ARB but patient refused to take new meds at this time -Diet and exercise

## 2012-03-10 NOTE — Assessment & Plan Note (Addendum)
Not at goal.  She is currently taking HCTZ. Not on ACEi/ARB because patient refused to take new meds.   -Will check BMP today to make sure her Cr is ok -Continue HCTZ

## 2012-03-10 NOTE — Progress Notes (Signed)
HPI: Wendy [name] is a 61 yo W with PMH of uncontrolled DM II, HTN, HLP presents today for follow up. She has been doing ok except for being stressed out for a while. She has been eating more sweet because of craving. She has been seen at [facility] for retinopathy/glaucoma, has another appointment with them next week.  She has some numbness and tingling in her feet.  Occasionally, she has foamy urine.  She has not been checking her blood sugar at home.  She states that "i dont want to get anything done or take more medications until the Obama care comes out".  She declined colonoscopy, pap smear, and mammogram!!  ROS: as per HPI   PE:  General: alert, well-developed, and cooperative to examination.  Lungs: normal respiratory effort, no accessory muscle use, normal breath sounds, no crackles, and no wheezes. Heart: normal rate, regular rhythm, no murmur, no gallop, and no rub.  Abdomen: soft, non-tender, normal bowel sounds, no distention, no guarding, no rebound tenderness,  Msk: no joint swelling, no joint warmth, and no redness over joints.  Pulses: 2+ DP/PT pulses bilaterally Extremities: No cyanosis, clubbing, edema, no ulcers noted Neurologic: alert & oriented X3, cranial nerves II-XII intact, strength normal in all extremities, sensation intact to light touch, and gait normal.

## 2012-03-10 NOTE — Assessment & Plan Note (Addendum)
Poorly controlled, LDL is 174 in 07/2011.  She does not want to start on statin, I discussed this with patient multiple times but she is very adamant about not taking it. -In the mean time, we'll continue diet and exercise

## 2012-03-11 LAB — BASIC METABOLIC PANEL WITH GFR
BUN: 39 mg/dL — ABNORMAL HIGH (ref 6–23)
CO2: 25 mEq/L (ref 19–32)
Calcium: 9.8 mg/dL (ref 8.4–10.5)
Chloride: 101 mEq/L (ref 96–112)
Creat: 1.65 mg/dL — ABNORMAL HIGH (ref 0.50–1.10)
GFR, Est Non African American: 33 mL/min — ABNORMAL LOW
Glucose, Bld: 202 mg/dL — ABNORMAL HIGH (ref 70–99)
Sodium: 136 mEq/L (ref 135–145)

## 2012-03-11 LAB — URINALYSIS, MICROSCOPIC ONLY: Bacteria, UA: NONE SEEN

## 2012-03-11 LAB — URINALYSIS, ROUTINE W REFLEX MICROSCOPIC
Glucose, UA: NEGATIVE mg/dL
Hgb urine dipstick: NEGATIVE
Nitrite: NEGATIVE
Protein, ur: NEGATIVE mg/dL
Specific Gravity, Urine: 1.021 (ref 1.005–1.030)

## 2012-03-11 LAB — MICROALBUMIN / CREATININE URINE RATIO
Creatinine, Urine: 153.4 mg/dL
Microalb Creat Ratio: 19.3 mg/g (ref 0.0–30.0)
Microalb, Ur: 2.96 mg/dL — ABNORMAL HIGH (ref 0.00–1.89)

## 2012-06-15 ENCOUNTER — Ambulatory Visit (HOSPITAL_COMMUNITY)
Admission: RE | Admit: 2012-06-15 | Discharge: 2012-06-15 | Disposition: A | Discharge status: Home / Self Care | Payer: Self-pay | Source: Ambulatory Visit | Attending: Internal Medicine | Admitting: Internal Medicine

## 2012-06-15 ENCOUNTER — Encounter: Payer: Self-pay | Admitting: Internal Medicine

## 2012-06-15 ENCOUNTER — Ambulatory Visit (INDEPENDENT_AMBULATORY_CARE_PROVIDER_SITE_OTHER): Payer: Self-pay | Admitting: Internal Medicine

## 2012-06-15 VITALS — BP 150/60 | HR 70 | Temp 97.9°F | Ht 64.0 in

## 2012-06-15 DIAGNOSIS — M25562 Pain in left knee: Secondary | ICD-10-CM

## 2012-06-15 DIAGNOSIS — Z0000 Encounter for general adult medical examination without abnormal findings: Secondary | ICD-10-CM

## 2012-06-15 DIAGNOSIS — E119 Type 2 diabetes mellitus without complications: Secondary | ICD-10-CM

## 2012-06-15 DIAGNOSIS — I10 Essential (primary) hypertension: Secondary | ICD-10-CM

## 2012-06-15 DIAGNOSIS — M1710 Unilateral primary osteoarthritis, unspecified knee: Secondary | ICD-10-CM | POA: Insufficient documentation

## 2012-06-15 DIAGNOSIS — Z23 Encounter for immunization: Secondary | ICD-10-CM

## 2012-06-15 DIAGNOSIS — IMO0002 Reserved for concepts with insufficient information to code with codable children: Secondary | ICD-10-CM

## 2012-06-15 DIAGNOSIS — M25469 Effusion, unspecified knee: Secondary | ICD-10-CM | POA: Insufficient documentation

## 2012-06-15 DIAGNOSIS — Z79899 Other long term (current) drug therapy: Secondary | ICD-10-CM

## 2012-06-15 DIAGNOSIS — M25569 Pain in unspecified knee: Secondary | ICD-10-CM | POA: Insufficient documentation

## 2012-06-15 LAB — GLUCOSE, CAPILLARY: Glucose-Capillary: 138 mg/dL — ABNORMAL HIGH (ref 70–99)

## 2012-06-15 LAB — POCT GLYCOSYLATED HEMOGLOBIN (HGB A1C): Hemoglobin A1C: 8.8

## 2012-06-15 NOTE — Assessment & Plan Note (Addendum)
Patient presents with complaints of left knee swelling and worsening pain for last 2 weeks, that is apparently a little better today.  She has tried ice and ibuprofen at home that helps her a little. Pain gets worse with walking and prolonged standing. On exam she was noted to have left knee swelling likely secondary to underlying effusion with no erythema or warmth. I think this is most likely from osteoarthritis flare with recent increase in the activity. Patient does not have any insurance /orange card and is waiting for OBAMA care to get implemented. She is not willing to get knee replacement if needed ,either. She has tried corticosteroid shots in the past and has also undergone arthroscopic procedure which has helped her. Gout is also a possibility but appears less likely in the absence of any red or inflamed joint. Patient refused blood draw today and therefore could not check uric acid. She is extremely worried of the cost from any health care related procedure or treatment.  - Sports medicine referral for steroid shot.   - She was also advised to get her paperwork and discuss the possibility of getting orange card with Rudell Cobb.  - Would get X- ray of her knee.   - Continue applying heat/ice to the affected area. - Continue Ibuprofen for pain relief.

## 2012-06-15 NOTE — Patient Instructions (Addendum)
General Instructions: Please schedule a follow up appointment in 2 -3 weeks . Please bring your medication bottles with your next appointment. Please take your medicines as prescribed. I will call you with your lab results if anything will be abnormal. Please continue apply ice /heat your left knee. Please continue taking ibuprofen     Treatment Goals:  Goals (1 Years of Data) as of 06/15/2012    None      Progress Toward Treatment Goals:  Treatment Goal 06/15/2012  Hemoglobin A1C improved    Self Care Goals & Plans:  Self Care Goal 06/15/2012  Manage my medications take my medicines as prescribed; bring my medications to every visit  Monitor my health bring my glucose meter and log to each visit; keep track of my blood glucose  Eat healthy foods drink diet soda or water instead of juice or soda; eat more vegetables  Be physically active find an activity I enjoy    Home Blood Glucose Monitoring 06/15/2012  Check my blood sugar 3 times a day  When to check my blood sugar before meals     Care Management & Community Referrals:  Referral 06/15/2012  Referrals made for care management support diabetes educator

## 2012-06-15 NOTE — Progress Notes (Signed)
Subjective:   Patient ID: Wendy [name] female   DOB: 07-Jan-1951 62 y.o.   MRN: [id number]  HPI: 63 year old woman with past medical history significant for left knee osteoarthritis, diabetes mellitus, hypertension comes to the clinic for a followup visit.   Left knee pain: Patient reports that she had osteoarthritis in her left knee for several years and was  recommended to have knee replacement about 10 years ago but she refused. She was getting off-and-on knee pain which was tolerable. 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 She noticed swelling of her left knee for last 2 weeks which was "quite big" and associated with a lot of pain( 11 out of 10). She also reports pain radiation to the lateral aspect of her thigh and to some extent in the hip. She applied ice and has been taking ibuprofen which has helped her a little. She states that her knee feels a little better today but still not at her baseline. Her knee pain gets worse with walking and standing. She could not get out of her house for a few days because of severe pain. She has had arthroscopic surgery and corticosteroid shortness to her left knee for the similar complaint. She is not sure if her knee was red and hot during the episode. Denies any fevr, chills, nausea or vomiting.   DM: She did not bring her meter. Her old meter has some problems and  she is replacing it with the new meter that she has ordered and would be receiving in next few days. She takes glipizide daily. Denies any hypoglycemic events. Patient is not willing to try any new medications because of the added cost. She is waiting for Obama care to come into play.  She is refusing  blood draw today again because of the additional cost to the visit.    Past Medical History  Diagnosis Date  . Sarcoidosis   . Hypertension   . Diabetes mellitus    No family  history on file. History   Social History  . Marital Status: Married    Spouse Name: N/A    Number of Children: N/A  . Years of Education: N/A   Occupational History  . Not on file.   Social History Main Topics  . Smoking status: Former Smoker    Quit date: 08/09/1976  . Smokeless tobacco: Not on file  . Alcohol Use: No  . Drug Use: No  . Sexually Active: Not on file   Other Topics Concern  . Not on file   Social History Narrative  . No narrative on file   Review of Systems: General: Denies fever, chills, diaphoresis, appetite change and fatigue. HEENT: Denies photophobia, eye pain, redness, hearing loss, ear pain, congestion, sore throat, rhinorrhea, sneezing, mouth sores, trouble swallowing, neck pain, neck stiffness and tinnitus. Respiratory: Denies SOB, DOE, cough, chest tightness, and wheezing. Cardiovascular: Denies to chest pain, palpitations and leg swelling. Gastrointestinal: Denies nausea, vomiting, abdominal pain, diarrhea, constipation, blood in stool and abdominal distention. Genitourinary: Denies dysuria, urgency, frequency, hematuria, flank pain and difficulty urinating. Musculoskeletal: Denies myalgias, back pain, joint swelling, arthralgias and gait problem, +Knee pain.  Skin: Denies pallor, rash and wound. Neurological: Denies dizziness, seizures, syncope, weakness, light-headedness, numbness and headaches. Hematological: Denies adenopathy, easy bruising, personal or family bleeding history. Psychiatric/Behavioral: Denies suicidal ideation, mood changes, confusion, nervousness, sleep disturbance and agitation.  Current Outpatient Medications: Current Outpatient Prescriptions  Medication Sig Dispense Refill  . glipiZIDE (GLUCOTROL) 10 MG tablet Take 1 tablet (10 mg total) by mouth daily.  30 tablet  11  . hydrochlorothiazide (HYDRODIURIL) 25 MG tablet Take 1 tablet (25 mg total) by mouth daily.  30 tablet  11    Allergies: Allergies  Allergen  Reactions  . Metformin And Related Nausea Only    "severe itching"      Objective:   Physical Exam: Filed Vitals:   06/15/12 0906  BP: 153/87  Pulse: 76  Temp: 97.9 F (36.6 C)    General: Vital signs reviewed and noted. Well-developed, well-nourished, in no acute distress; alert, appropriate and cooperative throughout examination. Head: Normocephalic, atraumatic Lungs: Normal respiratory effort. Clear to auscultation BL without crackles or wheezes. Heart: RRR. S1 and S2 normal without gallop, murmur, or rubs. Abdomen:BS normoactive. Soft, Nondistended, non-tender.  No masses or organomegaly. Extremities: No pretibial edema. Muskuloskeletal: Left knee moderate swelling present but no erythema or warmth, restricted ROM from pain      Assessment & Plan:

## 2012-06-16 MED ORDER — GLIPIZIDE 10 MG PO TABS: 10.0000 mg | ORAL_TABLET | Freq: Two times a day (BID) | ORAL | Status: DC

## 2012-06-16 NOTE — Assessment & Plan Note (Signed)
HbAIC improved from 9.0 to 8.8. Patient is very satisfied with her results as it is improving. She was explained that this A1c is still not at goal and she still has the risks of stroke,kidney failure and other complications with her not so well controlled diabetes. Of note, patient is allergic to metformin -it caused her some hives. She was offered additional oral agents versus insulin to help with better control of her diabetes that she refused. The other option is to go up on the dose of glipizide which she is willing to try. - Increase glipizide from 10 mg daily to 10 mg BID. - She was offered to be started on ACE- I/ARB again with this visit ,but refused. I think she is extremely worried from incurring cost with the addition of  new medications.  - Would continue to educate her with future visits.

## 2012-06-16 NOTE — Assessment & Plan Note (Signed)
Her BP was mildly elevated that could also be related to her knee pain. But reviewing her BP  trend and old notes , she was again offered to be started on ACE- I/ARB and also told her that it would cost her 4 $ but she refused to be started on any medication.

## 2012-06-19 ENCOUNTER — Encounter: Payer: Self-pay | Admitting: Internal Medicine

## 2012-06-23 ENCOUNTER — Telehealth: Payer: Self-pay | Admitting: Internal Medicine

## 2012-06-23 NOTE — Telephone Encounter (Signed)
I called patient to check on her left knee pain and swelling. Her X- ray showed tricompartmental OA, most marked medially with effusion.  She still reports having severe pain but reports that swelling has subsided with the heat/ice. Denies noticing any redness or warmth. Also denies any fever, chills, nausea or vomiting.   I advised her to turn in her paper work for orange card to [employer] so that sports medicine referral could be placed.  She verbalizes understanding.   Thank you, Wendy [name]

## 2012-06-29 ENCOUNTER — Ambulatory Visit: Payer: Self-pay

## 2012-06-29 ENCOUNTER — Ambulatory Visit: Payer: Self-pay | Admitting: Internal Medicine

## 2012-06-29 ENCOUNTER — Ambulatory Visit (INDEPENDENT_AMBULATORY_CARE_PROVIDER_SITE_OTHER): Payer: Self-pay | Admitting: Internal Medicine

## 2012-06-29 ENCOUNTER — Encounter: Payer: Self-pay | Admitting: Internal Medicine

## 2012-06-29 VITALS — BP 150/76 | HR 84 | Temp 97.8°F | Ht 64.0 in | Wt 246.1 lb

## 2012-06-29 DIAGNOSIS — M1710 Unilateral primary osteoarthritis, unspecified knee: Secondary | ICD-10-CM

## 2012-06-29 DIAGNOSIS — Z1231 Encounter for screening mammogram for malignant neoplasm of breast: Secondary | ICD-10-CM

## 2012-06-29 DIAGNOSIS — I10 Essential (primary) hypertension: Secondary | ICD-10-CM

## 2012-06-29 DIAGNOSIS — E1139 Type 2 diabetes mellitus with other diabetic ophthalmic complication: Secondary | ICD-10-CM

## 2012-06-29 DIAGNOSIS — E785 Hyperlipidemia, unspecified: Secondary | ICD-10-CM

## 2012-06-29 DIAGNOSIS — Z0000 Encounter for general adult medical examination without abnormal findings: Secondary | ICD-10-CM

## 2012-06-29 DIAGNOSIS — E119 Type 2 diabetes mellitus without complications: Secondary | ICD-10-CM

## 2012-06-29 DIAGNOSIS — IMO0002 Reserved for concepts with insufficient information to code with codable children: Secondary | ICD-10-CM

## 2012-06-29 HISTORY — DX: Encounter for screening mammogram for malignant neoplasm of breast: Z12.31

## 2012-06-29 LAB — GLUCOSE, CAPILLARY: Glucose-Capillary: 183 mg/dL — ABNORMAL HIGH (ref 70–99)

## 2012-06-29 NOTE — Assessment & Plan Note (Signed)
His LDL was 174 on 08/10/11. Her goal of LDL is less than 100 or ideally less than 70. I offered her statine treatment, but she refused. It is most likely due to financial difficulties. Patient was given referral to social worker for orange card application.

## 2012-06-29 NOTE — Progress Notes (Signed)
Patient ID: Wendy [name], female   DOB: Dec 19, 1950, 62 y.o.   MRN: [id number] Subjective:   Patient ID: Wendy [name] female   DOB: 1950/11/21 62 y.o.   MRN: [id number]  CC:  2 week follow up visit for knee pain.  HPI:  [name] is a 62 y.o. lady with past medical history as outlined below, who presents for a followup visit.                1.) Left knee pain: Patient has chronic left knee pain for many years. She was recommended to have knee replacement about 10 years ago by surgeon but she refused. Because of worsening swelling and knee pain secondary to exercise, she was recently seen in clinic on 06/16/12. She had an x-ray for left knee, which showed tricompartment osteoarthritis. Currently patient is taking ibuprofen and also using ice or heat pad. She reports that her left knee swelling and pain improved in the past week.   2.) DM: She did not bring her meter. She takes glipizide 10 mg daily. Denies any hypoglycemic events. Patient is not willing to try any new medications because of the added cost. She is waiting for Obama care to come into play.  3.) HLD: Her LDL was 174 on 08/10/11. She is not taking any medications for hyperlipidemia currently.  4.) HTN: Her blood pressure was not well controlled. Today blood pressure is 150/76. In her previous visit, patient was offered lisinopril, but she refused it likely due to financial difficulties.     Past Medical History  Diagnosis Date  . Sarcoidosis   . Hypertension   . Diabetes mellitus    Current Outpatient Prescriptions  Medication Sig Dispense Refill  . glipiZIDE (GLUCOTROL) 10 MG tablet Take 1 tablet (10 mg total) by mouth 2 (two) times daily before a meal.  60 tablet  3  . hydrochlorothiazide (HYDRODIURIL) 25 MG tablet Take 1 tablet (25 mg total) by mouth daily.  30 tablet  11   No family history on file. History   Social History  . Marital Status: Married    Spouse Name: N/A    Number of Children: N/A  . Years  of Education: N/A   Social History Main Topics  . Smoking status: Former Smoker    Quit date: 08/09/1976  . Smokeless tobacco: None  . Alcohol Use: No  . Drug Use: No  . Sexually Active: None   Other Topics Concern  . None   Social History Narrative  . None    Review of Systems: General: no fevers, chills, no changes in body weight, no changes in appetite Skin: no rash HEENT: no blurry vision, hearing changes or sore throat Pulm: no dyspnea, coughing, wheezing CV: no chest pain, palpitations, shortness of breath Abd: no nausea/vomiting, abdominal pain, diarrhea/constipation GU: no dysuria, hematuria, polyuria Ext: has swelling and pain in left knee  Neuro: no weakness, numbness, or tingling  Objective:  Physical Exam: Filed Vitals:   06/29/12 1329  BP: 150/76  Pulse: 84  Temp: 97.8 F (36.6 C)  TempSrc: Oral  Height: 5\' 4"  (1.626 m)  Weight: 246 lb 1.6 oz (111.63 kg)  SpO2: 97%   General: Not in acute distress HEENT: PERRL, EOMI, no scleral icterus, No bruit or JVD Cardiac: S1/S2, RRR, No murmurs, gallops or rubs Pulm: Good air movement bilaterally, Clear to auscultation bilaterally, No rales, wheezing, rhonchi or rubs. Abd: Soft,  nondistended, nontender, no rebound pain, no organomegaly, BS present  Ext: No rashes or edema, 2+DP/PT pulse bilaterally Musculoskeletal: there is swelling over left knee joint, worse on the medial side. There is tenderness over left knee joint. There is no warmth or redness over left knee joint. Skin: no rashes. No skin bruise. Neuro: Alert and oriented X3, cranial nerves II-XII grossly intact, muscle strength 5/5 in all extremeties,  sensation to light touch intact.  Psych: patient is not psychotic, no suicidal or hemocidal ideation.    Assessment & Plan:

## 2012-06-29 NOTE — Assessment & Plan Note (Signed)
Lab Results  Component Value Date   HGBA1C 8.8 06/15/2012   HGBA1C 9.0 03/10/2012   HGBA1C 9.6 11/09/2011     Assessment:  Diabetes control: fair control  Progress toward A1C goal:  improved  Comments:  Plan:  Medications:  continue current medications  Home glucose monitoring:   Frequency:     Timing:    Instruction/counseling given: reminded to bring blood glucose meter & log to each visit  Educational resources provided: brochure  Self management tools provided:    Other plans: Patient's glipizide dosage was recently increased to 10 mg daily. We'll continue current regimen.

## 2012-06-29 NOTE — Assessment & Plan Note (Signed)
Her left knee pain is most likely due to osteoarthritis, as evidenced on recent x-ray. It is unlikely to have gouty arthritis or septic arthritis given no local warmth or redness. Currently patient is taking ibuprofen and using ice or heat pad at home with improvement. We'll continue current regimen and add tylenol to her regimen. I discussed with patient about referral to sports medicine for further evaluation and possible knee replacement. She would like to wait until she gets her insurance. Will give her referral to social worker for orange card application.

## 2012-06-29 NOTE — Assessment & Plan Note (Signed)
Patient refused all healthcare maintenance and related tests today. It is because of lack of insurance.  She did not noticed any blood in her stool, also not noticed any masses in her breasts. Patient was given referral to social worker for orange card application.

## 2012-06-29 NOTE — Assessment & Plan Note (Deleted)
Lab Results  Component Value Date   HGBA1C 8.8 06/15/2012   HGBA1C 9.0 03/10/2012   HGBA1C 9.6 11/09/2011     Assessment:  Diabetes control: fair control  Progress toward A1C goal:  improved  Comments:  Plan:  Medications:  continue current medications  Home glucose monitoring:   Frequency:     Timing:    Instruction/counseling given: reminded to bring blood glucose meter & log to each visit  Educational resources provided: brochure  Self management tools provided:    Other plans: Patient's glipizide dosage was recently increased to 10 mg daily. We'll continue current regimen.     

## 2012-06-29 NOTE — Patient Instructions (Signed)
1. Please start taking tylenol 650 mg 3 times a day for your knee pain. Please talk to our social work for application of [employer] card. 2. Please take all medications as prescribed.  3. If you have worsening of your symptoms or new symptoms arise, please call the clinic ([phone]), or go to the ER immediately if symptoms are severe.

## 2012-06-29 NOTE — Assessment & Plan Note (Signed)
BP Readings from Last 3 Encounters:  06/29/12 150/76  06/15/12 150/60  03/10/12 146/86    Lab Results  Component Value Date   NA 136 03/10/2012   K 4.3 03/10/2012   CREATININE 1.65* 03/10/2012    Assessment:  Blood pressure control: mildly elevated  Progress toward BP goal:  unchanged  Comments:   Plan:  Medications:  continue current medications  Educational resources provided: brochure  Self management tools provided: home blood pressure logbook  Other plans: Patient's blood pressure has not been at goal. And further to add low dose of lisinopril today, but she refused it. It is most likely due to financial difficulties. I will give patient a referral to social worker for orange card application today.

## 2012-07-21 ENCOUNTER — Ambulatory Visit: Payer: Self-pay

## 2012-07-25 ENCOUNTER — Encounter: Payer: Self-pay | Admitting: Internal Medicine

## 2012-07-25 DIAGNOSIS — E11319 Type 2 diabetes mellitus with unspecified diabetic retinopathy without macular edema: Secondary | ICD-10-CM | POA: Insufficient documentation

## 2012-07-25 DIAGNOSIS — E113299 Type 2 diabetes mellitus with mild nonproliferative diabetic retinopathy without macular edema, unspecified eye: Secondary | ICD-10-CM | POA: Insufficient documentation

## 2012-07-28 ENCOUNTER — Ambulatory Visit: Payer: Self-pay

## 2012-08-03 ENCOUNTER — Ambulatory Visit: Payer: Self-pay

## 2012-08-15 ENCOUNTER — Encounter: Payer: Self-pay | Admitting: Internal Medicine

## 2012-10-12 ENCOUNTER — Other Ambulatory Visit: Payer: Self-pay | Admitting: Internal Medicine

## 2012-11-21 ENCOUNTER — Encounter: Payer: Self-pay | Admitting: Dietician

## 2012-12-21 ENCOUNTER — Other Ambulatory Visit: Payer: Self-pay

## 2013-01-22 ENCOUNTER — Ambulatory Visit (INDEPENDENT_AMBULATORY_CARE_PROVIDER_SITE_OTHER): Payer: No Typology Code available for payment source | Admitting: Internal Medicine

## 2013-01-22 ENCOUNTER — Inpatient Hospital Stay (HOSPITAL_COMMUNITY): Payer: No Typology Code available for payment source

## 2013-01-22 ENCOUNTER — Encounter (HOSPITAL_COMMUNITY): Payer: Self-pay | Admitting: *Deleted

## 2013-01-22 ENCOUNTER — Inpatient Hospital Stay (HOSPITAL_COMMUNITY)
Admission: AD | Admit: 2013-01-22 | Discharge: 2013-01-23 | Disposition: A | Discharge status: Home / Self Care | Payer: No Typology Code available for payment source | Source: Ambulatory Visit | Attending: Obstetrics & Gynecology | Admitting: Obstetrics & Gynecology

## 2013-01-22 ENCOUNTER — Encounter: Payer: Self-pay | Admitting: Internal Medicine

## 2013-01-22 ENCOUNTER — Telehealth: Payer: Self-pay | Admitting: *Deleted

## 2013-01-22 VITALS — BP 150/88 | HR 87 | Temp 99.1°F | Ht 64.0 in | Wt 245.0 lb

## 2013-01-22 DIAGNOSIS — E1165 Type 2 diabetes mellitus with hyperglycemia: Secondary | ICD-10-CM

## 2013-01-22 DIAGNOSIS — I10 Essential (primary) hypertension: Secondary | ICD-10-CM

## 2013-01-22 DIAGNOSIS — E119 Type 2 diabetes mellitus without complications: Secondary | ICD-10-CM

## 2013-01-22 DIAGNOSIS — N898 Other specified noninflammatory disorders of vagina: Secondary | ICD-10-CM

## 2013-01-22 DIAGNOSIS — M545 Low back pain, unspecified: Secondary | ICD-10-CM | POA: Insufficient documentation

## 2013-01-22 DIAGNOSIS — E1139 Type 2 diabetes mellitus with other diabetic ophthalmic complication: Secondary | ICD-10-CM

## 2013-01-22 DIAGNOSIS — N939 Abnormal uterine and vaginal bleeding, unspecified: Secondary | ICD-10-CM | POA: Insufficient documentation

## 2013-01-22 DIAGNOSIS — N390 Urinary tract infection, site not specified: Secondary | ICD-10-CM | POA: Insufficient documentation

## 2013-01-22 DIAGNOSIS — E785 Hyperlipidemia, unspecified: Secondary | ICD-10-CM

## 2013-01-22 DIAGNOSIS — N950 Postmenopausal bleeding: Secondary | ICD-10-CM | POA: Insufficient documentation

## 2013-01-22 LAB — LIPID PANEL
Cholesterol: 215 mg/dL — ABNORMAL HIGH (ref 0–200)
HDL: 63 mg/dL (ref >39)
Total CHOL/HDL Ratio: 3.4 Ratio
VLDL: 13 mg/dL (ref 0–40)

## 2013-01-22 LAB — COMPLETE METABOLIC PANEL WITH GFR
AST: 16 U/L (ref 0–37)
Albumin: 3.3 g/dL — ABNORMAL LOW (ref 3.5–5.2)
Alkaline Phosphatase: 91 U/L (ref 39–117)
GFR, Est Non African American: 42 mL/min — ABNORMAL LOW
Glucose, Bld: 210 mg/dL — ABNORMAL HIGH (ref 70–99)
Potassium: 4.4 mEq/L (ref 3.5–5.3)
Sodium: 137 mEq/L (ref 135–145)
Total Bilirubin: 0.3 mg/dL (ref 0.3–1.2)
Total Protein: 7.6 g/dL (ref 6.0–8.3)

## 2013-01-22 LAB — CBC
HCT: 37.9 % (ref 36.0–46.0)
Hemoglobin: 12.4 g/dL (ref 12.0–15.0)
MCH: 26.4 pg (ref 26.0–34.0)
MCHC: 32.7 g/dL (ref 30.0–36.0)

## 2013-01-22 LAB — CBC WITH DIFFERENTIAL/PLATELET
Basophils Relative: 0 % (ref 0–1)
Eosinophils Absolute: 0.1 10*3/uL (ref 0.0–0.7)
Eosinophils Relative: 2 % (ref 0–5)
Hemoglobin: 12.7 g/dL (ref 12.0–15.0)
Lymphs Abs: 2 10*3/uL (ref 0.7–4.0)
MCH: 26.6 pg (ref 26.0–34.0)
MCHC: 32.7 g/dL (ref 30.0–36.0)
MCV: 81.3 fL (ref 78.0–100.0)
Monocytes Relative: 7 % (ref 3–12)
RBC: 4.77 MIL/uL (ref 3.87–5.11)

## 2013-01-22 LAB — URINALYSIS, ROUTINE W REFLEX MICROSCOPIC
Glucose, UA: >1000 mg/dL — AB
Ketones, ur: NEGATIVE mg/dL
Protein, ur: NEGATIVE mg/dL

## 2013-01-22 LAB — POCT GLYCOSYLATED HEMOGLOBIN (HGB A1C): Hemoglobin A1C: 8.8

## 2013-01-22 LAB — URINE MICROSCOPIC-ADD ON

## 2013-01-22 MED ORDER — MEDROXYPROGESTERONE ACETATE 10 MG PO TABS: 10.0000 mg | ORAL_TABLET | Freq: Every day | ORAL | Status: DC

## 2013-01-22 MED ORDER — HYDROCHLOROTHIAZIDE 25 MG PO TABS: 25.0000 mg | ORAL_TABLET | Freq: Every day | ORAL | Status: DC

## 2013-01-22 MED ORDER — ACCU-CHEK MULTICLIX LANCETS MISC: Status: DC

## 2013-01-22 MED ORDER — GLIPIZIDE 10 MG PO TABS: 10.0000 mg | ORAL_TABLET | Freq: Two times a day (BID) | ORAL | Status: DC

## 2013-01-22 MED ORDER — GLUCOSE BLOOD VI STRP: ORAL_STRIP | Status: DC

## 2013-01-22 MED ORDER — CIPROFLOXACIN HCL 500 MG PO TABS: 500.0000 mg | ORAL_TABLET | Freq: Two times a day (BID) | ORAL | Status: DC

## 2013-01-22 NOTE — Assessment & Plan Note (Signed)
Checked lipid panel today. 

## 2013-01-22 NOTE — Progress Notes (Signed)
Patient ID: Wendy [name], female   DOB: 11/07/1950, 62 y.o.   MRN: [id number] HPI The patient is a G66P4 62 y.o. female with a history of DM type II w/ retinopathy, HTN, HL, OA who presents for an acute visit today for vaginal bleeding.  Patient reports that she woke up to use the bathroom at 0300 two nights ago and noticed some bleeding from her vagina. She wiped a few times and the bleeding stopped, but there was a few drops that landed in the toilet. Since onset she has used folded up paper towels to absorb the blood and has had to change them 4-5 times daily. Amount of bleeding has been constant since onset. Denies any foul smelling discharge now or in the last few months. She also c/o some dull, constant 6/10 centralized back pain that feels "deep within" x couple of days. She has had intermittent episodes of similar back pain for a few years. She also has some mild suprapubic abdominal cramping that began when the bleeding started. She denies N/V/D, constipation, dark or bloody stools, dysuria, hematuria. Last normal BM was yesterday, brown in color. She has never had any vaginal bleeding since menopause 7-8 years ago. She is monogamous with her husband x 43 years, last sexual intercourse was 2-3 months ago. She takes a baby ASA once every other day, but no other blood thinners or antiplatelet agents. Of note, patient received a corticosteroid injection in her L knee a few days ago and she is wondering if this may be contributing to her symptoms.  HTN- Patient has been out of her HCTZ for the past 2-3 months and needs a refill today. BP today is elevated at 150/88.  DM type 2- Patient has poorly controlled BG, HbA1c today is 8.8. She did not bring her glucose monitor and reports only checking her blood glucose once every few weeks. Last microalbumin/Cr ratio was 19.3 (02/2012). She reports taking her glipizide once daily- it is prescribed for twice daily. Patient has been seen by ophthalmologist in the  past and has background retinopathy- she is due for her annual exam 02/2013 and she states she intends to follow up. She is due for a foot exam today.  ROS: General: no fevers, chills, changes in weight, changes in appetite Skin: no rash HEENT: no blurry vision, hearing changes, sore throat Pulm: no dyspnea, coughing, wheezing CV: no chest pain, palpitations, shortness of breath Abd: +mild suprapubic cramping, nausea/vomiting, diarrhea/constipation GU: +vaginal bleeding; no dysuria, hematuria, polyuria Ext: no arthralgias, myalgias Neuro: no weakness, numbness, or tingling  Filed Vitals:   01/22/13 1529  BP: 150/88  Pulse: 87  Temp: 99.1 F (37.3 C)   Physical exam: General: alert, cooperative, and in no apparent distress HEENT: pupils equal round and reactive to light, vision grossly intact, no conjunctival pallor, oropharynx clear and non-erythematous  Neck: supple Lungs: clear to ascultation bilaterally, normal work of respiration, no wheezes, rales, ronchi Heart: regular rate and rhythm, no murmurs, gallops, or rubs Abdomen: soft, obese, mild ttp over suprapubic region, non-distended, normal bowel sounds Extremities: 2+ DP/PT pulses bilaterally, trace BLE edema Neurologic: alert & oriented X3, cranial nerves II-XII grossly intact, strength grossly intact, sensation intact to light touch, gait normal  No current outpatient prescriptions on file prior to visit.   No current facility-administered medications on file prior to visit.    Assessment/Plan

## 2013-01-22 NOTE — Assessment & Plan Note (Signed)
Patient with two day history of vaginal bleeding w/ associated central back and suprapubic abdominal pain. While this could represent atrophy vs endometrial hyperplasia vs polyps vs endometrial cancer. Will check CBC today to evaluate Hb- patient does not endorse any symptoms of anemia today. She will need a pap smear and likely an endometrial biopsy. Therefore, instead of subjecting patient to two pelvic exams in one day, we deferred the pelvic exam today and sent patient directly over to [facility]. She was instructed to walk into the Maternal Admissions Unit for evaluation.  -tylenol for pain- she was instructed to avoid NSAIDs due to her renal insufficiency as well as her recent bleeding

## 2013-01-22 NOTE — Assessment & Plan Note (Signed)
Patient's blood glucose is not controlled. HbA1c today was 8.8%. She was only taking glipizide once daily. Instructed her to increase this to twice daily as it is prescribed. Foot exam performed today. Encouraged her to follow up with her ophthalmologist as she is due for an appointment in Sept. 2014.  -checked lipids, CMP, urine microalbumin today -ordered lancets and truetrack test strips today -glipizide 10mg  bid -consult to diabetes educator  *If patient's blood glucose continues to be under poor control with the proper dose of glipizide and with diabetes education, will consider adding Januvia (is not candidate for metformin as she has poor renal function).  Lab Results  Component Value Date   HGBA1C 8.8 01/22/2013   HGBA1C 8.8 06/15/2012   HGBA1C 9.0 03/10/2012     Assessment: Diabetes control: fair control Progress toward A1C goal:  improved Comments: not taking glipizide appropriately  Plan: Medications:  continue current medications Home glucose monitoring: Frequency:  (once every few weeks) Timing: N/A Instruction/counseling given: reminded to get eye exam and reminded to bring blood glucose meter & log to each visit Educational resources provided: brochure Self management tools provided:   Other plans:

## 2013-01-22 NOTE — Telephone Encounter (Signed)
Pt called with c/o vaginal bleeding.  Onset yesterday. She is using a paper towel and having to change a few times.  States blood is bright.  Last period is years ago.  No bleeding since then.  She does not have a GYN doctor.  Denies dizziness or abd pain. She has had back pain X 3 weeks.   Talked with Dr Rogelia Boga.  Will see today for evaluation.

## 2013-01-22 NOTE — MAU Note (Signed)
Pt presents after going to [facility] today for postmenopausal bleeding. No bleeding x 8 years. Lower back pain, off/on lower abd cramping. Bleeding has slowed down now.

## 2013-01-22 NOTE — Patient Instructions (Signed)
Thank you for your visit today.  Please return to clinic to see me in 2 months.  Today we agreed that you would go over this evening to see an OBGYN at [facility] hospital. Please do this either tonight or first thing tomorrow morning. You may take Tylenol for your back and abdominal pain, but avoid ibuprofen. We discussed restarting your blood pressure medication, Hydrochlorothiazide, since you have been out of this medication. Your blood sugar is not well controlled, so we also talked about taking your glipizide TWICE daily rather than just once a day- please do this. Also, please check your blood sugar three times daily and bring your blood glucose monitor with you to clinic when you come next. Today we checked a few blood tests: lipid panel, CBC, CMP. We also checked your urine for microalbumin. Please follow up for your annual eye exam (it is due in September 2014).

## 2013-01-22 NOTE — MAU Provider Note (Signed)
Chief Complaint: Vaginal Bleeding and Back Pain   First Provider Initiated Contact with Patient 01/22/13 2143     SUBJECTIVE HPI: Wendy [name] is a 62 y.o. post-menopausal female who presents with vaginal bleeding and dull mid-low back pain x 2 days. Was seen At [facility] for same this afternoon. Has had similar back pain before. This is her first episode of vaginal bleeding in ~8 years. No IC x 2-3 months.   Past Medical History  Diagnosis Date  . Sarcoidosis   . Hypertension   . Diabetes mellitus    OB History   Grav Para Term Preterm Abortions TAB SAB Ect Mult Living                 No past surgical history on file. History   Social History  . Marital Status: Married    Spouse Name: N/A    Number of Children: N/A  . Years of Education: N/A   Occupational History  . Not on file.   Social History Main Topics  . Smoking status: Former Smoker    Quit date: 08/09/1976  . Smokeless tobacco: Not on file  . Alcohol Use: No  . Drug Use: No  . Sexually Active: Not on file   Other Topics Concern  . Not on file   Social History Narrative  . No narrative on file   No current facility-administered medications on file prior to encounter.   Current Outpatient Prescriptions on File Prior to Encounter  Medication Sig Dispense Refill  . glipiZIDE (GLUCOTROL) 10 MG tablet Take 1 tablet (10 mg total) by mouth 2 (two) times daily before a meal.  60 tablet  3  . glucose blood (TRUETRACK TEST) test strip Use as instructed  100 each  12  . hydrochlorothiazide (HYDRODIURIL) 25 MG tablet Take 1 tablet (25 mg total) by mouth daily.  30 tablet  0  . Lancets (ACCU-CHEK MULTICLIX) lancets Use as instructed  100 each  12   Allergies  Allergen Reactions  . Metformin And Related Nausea Only    "severe itching"    ROS: Positive for frequency and mild dysuria. Negative for fever, chills, dizziness, vaginal discharge, dyspareunia, flank pain, GI  complaints.  OBJECTIVE Blood pressure 145/70, pulse 94, temperature 98.3 F (36.8 C), temperature source Oral, resp. rate 20, height 5\' 4"  (1.626 m), weight 111.131 kg (245 lb), SpO2 100.00%. GENERAL: Well-developed, well-nourished female in no acute distress.  HEENT: Normocephalic HEART: normal rate RESP: normal effort ABDOMEN: Soft, non-tender. No CVA tenderness.  EXTREMITIES: Nontender, no edema NEURO: Alert and oriented SPECULUM EXAM: NEFG, physiologic discharge, small amount of dark red  blood noted, cervix clean. External os irregular, linear and stenotic.  BIMANUAL: cervix closed;  unable to assess uterus size due to body habitus, no adnexal tenderness or masses  LAB RESULTS Results for orders placed during the hospital encounter of 01/22/13 (from the past 168 hour(s))  CBC   Collection Time    01/22/13  7:41 PM      Result Value Range   WBC 9.2  4.0 - 10.5 K/uL   RBC 4.69  3.87 - 5.11 MIL/uL   Hemoglobin 12.4  12.0 - 15.0 g/dL   HCT 09.8  11.9 - 14.7 %   MCV 80.8  78.0 - 100.0 fL   MCH 26.4  26.0 - 34.0 pg   MCHC 32.7  30.0 - 36.0 g/dL   RDW 82.9  56.2 - 13.0 %   Platelets 265  150 - 400 K/uL  URINALYSIS, ROUTINE W REFLEX MICROSCOPIC   Collection Time    01/22/13  9:15 PM      Result Value Range   Color, Urine YELLOW  YELLOW   APPearance CLEAR  CLEAR   Specific Gravity, Urine 1.025  1.005 - 1.030   pH 5.5  5.0 - 8.0   Glucose, UA >1000 (*) NEGATIVE mg/dL   Hgb urine dipstick LARGE (*) NEGATIVE   Bilirubin Urine NEGATIVE  NEGATIVE   Ketones, ur NEGATIVE  NEGATIVE mg/dL   Protein, ur NEGATIVE  NEGATIVE mg/dL   Urobilinogen, UA 0.2  0.0 - 1.0 mg/dL   Nitrite NEGATIVE  NEGATIVE   Leukocytes, UA SMALL (*) NEGATIVE  URINE MICROSCOPIC-ADD ON   Collection Time    01/22/13  9:15 PM      Result Value Range   Squamous Epithelial / LPF FEW (*) RARE   WBC, UA 3-6  <3 WBC/hpf   RBC / HPF 7-10  <3 RBC/hpf   Bacteria, UA FEW (*) RARE   Urine-Other MUCOUS PRESENT      IMAGING US Transvaginal Non-ob  01/22/2013   *RADIOLOGY REPORT*  Clinical Data: Postmenopausal vaginal bleeding.  TRANSABDOMINAL AND TRANSVAGINAL ULTRASOUND OF PELVIS  Technique:  Both transabdominal and transvaginal ultrasound examinations of the pelvis were performed.  Transabdominal technique was performed for global imaging of the pelvis including uterus, ovaries, adnexal regions, and pelvic cul-de-sac.  It was necessary to proceed with endovaginal exam following the transabdominal exam to visualize the endometrium and ovaries.  Comparison:  None.  Findings: Uterus:  9.0 x 5.5 x 4.4 cm.  Predominately intramural masses, likely fibroids, noted with dominant lesions as follows:  Right lower uterine segment, lateral, subserosal / intramural, 3.3 x 3.1 x 2.9 cm.  Left mid uterine body, intramural, 1.4 x 1.3 x 1.1 cm.  Anterior uterine body, intramural, 0.7 x 0.7 x 0.5 cm  Endometrium: 9 mm, mildly irregular with endometrial fluid and mobile internal echoes at real time exam.  No focal measurable abnormality.  Right ovary: Not visualized.  No adnexal mass.  Left ovary: Not visualized.  No adnexal mass.  Other Findings:  No free fluid  IMPRESSION: Diffuse endometrial thickening. In the setting of post-menopausal bleeding, endometrial sampling is indicated to exclude carcinoma. If results are benign, sonohysterogram should be considered for focal lesion work-up. (Ref:  Radiological Reasoning: Algorithmic Workup of Abnormal Vaginal Bleeding with Endovaginal Sonography and Sonohysterography. AJR 2008; 952:W41-32)   Original Report Authenticated By: Christiana Pellant, M.D.   US Pelvis Complete  01/22/2013   *RADIOLOGY REPORT*  Clinical Data: Postmenopausal vaginal bleeding.  TRANSABDOMINAL AND TRANSVAGINAL ULTRASOUND OF PELVIS  Technique:  Both transabdominal and transvaginal ultrasound examinations of the pelvis were performed.  Transabdominal technique was performed for global imaging of the pelvis including  uterus, ovaries, adnexal regions, and pelvic cul-de-sac.  It was necessary to proceed with endovaginal exam following the transabdominal exam to visualize the endometrium and ovaries.  Comparison:  None.  Findings: Uterus:  9.0 x 5.5 x 4.4 cm.  Predominately intramural masses, likely fibroids, noted with dominant lesions as follows:  Right lower uterine segment, lateral, subserosal / intramural, 3.3 x 3.1 x 2.9 cm.  Left mid uterine body, intramural, 1.4 x 1.3 x 1.1 cm.  Anterior uterine body, intramural, 0.7 x 0.7 x 0.5 cm  Endometrium: 9 mm, mildly irregular with endometrial fluid and mobile internal echoes at real time exam.  No focal measurable abnormality.  Right ovary: Not visualized.  No adnexal mass.  Left ovary: Not visualized.  No adnexal mass.  Other Findings:  No free fluid  IMPRESSION: Diffuse endometrial thickening. In the setting of post-menopausal bleeding, endometrial sampling is indicated to exclude carcinoma. If results are benign, sonohysterogram should be considered for focal lesion work-up. (Ref:  Radiological Reasoning: Algorithmic Workup of Abnormal Vaginal Bleeding with Endovaginal Sonography and Sonohysterography. AJR 2008; 161:W96-04)   Original Report Authenticated By: Christiana Pellant, M.D.    MAU COURSE  ASSESSMENT 1. Postmenopausal vaginal bleeding   2. UTI (lower urinary tract infection)    PLAN Discharge home In stable condition. Follow-up Information   Follow up with [facility]. (will call you to schedule appointment)    Specialty:  Obstetrics and Gynecology   Contact information:   [address] [place] [address] [phone]      Follow up with [facility] [place] [facility]. (As needed emergencies)    Contact information:   [address] [id number] [place] [address] [phone]         Medication List         accu-chek multiclix lancets  Use as instructed     b complex vitamins tablet   Take 2 tablets by mouth daily.     ciprofloxacin 500 MG tablet  Commonly known as:  CIPRO  Take 1 tablet (500 mg total) by mouth 2 (two) times daily.     glipiZIDE 10 MG tablet  Commonly known as:  GLUCOTROL  Take 1 tablet (10 mg total) by mouth 2 (two) times daily before a meal.     glucose blood test strip  Commonly known as:  TRUETRACK TEST  Use as instructed     hydrochlorothiazide 25 MG tablet  Commonly known as:  HYDRODIURIL  Take 1 tablet (25 mg total) by mouth daily.     medroxyPROGESTERone 10 MG tablet  Commonly known as:  PROVERA  Take 1 tablet (10 mg total) by mouth daily.       [place], [place] 01/22/2013  9:24 PM

## 2013-01-22 NOTE — Assessment & Plan Note (Signed)
Patient has been out of her HCTZ for 2-3 months. BP elevated today at 150/88. She agrees to take this medication if refilled today. -refilled HCTZ 25mg  daily

## 2013-01-23 DIAGNOSIS — N950 Postmenopausal bleeding: Secondary | ICD-10-CM

## 2013-01-23 LAB — MICROALBUMIN / CREATININE URINE RATIO
Creatinine, Urine: 145.5 mg/dL
Microalb, Ur: 15.45 mg/dL — ABNORMAL HIGH (ref 0.00–1.89)

## 2013-01-23 NOTE — Progress Notes (Signed)
I saw and evaluated the patient.  I personally confirmed the key portions of the history and exam documented by Dr. Chikowski and I reviewed pertinent patient test results.  The assessment, diagnosis, and plan were formulated together and I agree with the documentation in the resident's note. 

## 2013-01-24 LAB — URINE CULTURE: Colony Count: 70000

## 2013-01-25 NOTE — MAU Provider Note (Signed)

## 2013-02-20 ENCOUNTER — Other Ambulatory Visit: Payer: Self-pay | Admitting: *Deleted

## 2013-02-20 NOTE — Telephone Encounter (Signed)
Pt was given accu-chek strips but has true track meter; needs new rx. Please check directions.  Thanks

## 2013-02-21 MED ORDER — LANCET DEVICE MISC: Status: DC

## 2013-02-26 ENCOUNTER — Ambulatory Visit: Payer: No Typology Code available for payment source | Admitting: Dietician

## 2013-03-01 ENCOUNTER — Ambulatory Visit: Payer: No Typology Code available for payment source | Admitting: Dietician

## 2013-03-12 ENCOUNTER — Other Ambulatory Visit: Payer: No Typology Code available for payment source | Admitting: Obstetrics and Gynecology

## 2013-04-09 ENCOUNTER — Ambulatory Visit (INDEPENDENT_AMBULATORY_CARE_PROVIDER_SITE_OTHER): Payer: No Typology Code available for payment source | Admitting: Internal Medicine

## 2013-04-09 ENCOUNTER — Encounter: Payer: Self-pay | Admitting: Internal Medicine

## 2013-04-09 ENCOUNTER — Ambulatory Visit (HOSPITAL_COMMUNITY)
Admission: RE | Admit: 2013-04-09 | Discharge: 2013-04-09 | Disposition: A | Discharge status: Home / Self Care | Payer: No Typology Code available for payment source | Source: Ambulatory Visit | Attending: Internal Medicine | Admitting: Internal Medicine

## 2013-04-09 VITALS — BP 185/85 | HR 91 | Temp 98.5°F | Ht 64.0 in | Wt 253.5 lb

## 2013-04-09 DIAGNOSIS — R05 Cough: Secondary | ICD-10-CM | POA: Insufficient documentation

## 2013-04-09 DIAGNOSIS — R059 Cough, unspecified: Secondary | ICD-10-CM

## 2013-04-09 DIAGNOSIS — I10 Essential (primary) hypertension: Secondary | ICD-10-CM

## 2013-04-09 DIAGNOSIS — N898 Other specified noninflammatory disorders of vagina: Secondary | ICD-10-CM

## 2013-04-09 DIAGNOSIS — R0602 Shortness of breath: Secondary | ICD-10-CM | POA: Insufficient documentation

## 2013-04-09 DIAGNOSIS — M1710 Unilateral primary osteoarthritis, unspecified knee: Secondary | ICD-10-CM

## 2013-04-09 DIAGNOSIS — N939 Abnormal uterine and vaginal bleeding, unspecified: Secondary | ICD-10-CM

## 2013-04-09 DIAGNOSIS — IMO0002 Reserved for concepts with insufficient information to code with codable children: Secondary | ICD-10-CM

## 2013-04-09 DIAGNOSIS — E119 Type 2 diabetes mellitus without complications: Secondary | ICD-10-CM

## 2013-04-09 LAB — GLUCOSE, CAPILLARY: Glucose-Capillary: 253 mg/dL — ABNORMAL HIGH (ref 70–99)

## 2013-04-09 MED ORDER — TRAMADOL HCL 50 MG PO TABS: 50.0000 mg | ORAL_TABLET | Freq: Four times a day (QID) | ORAL | Status: DC | PRN

## 2013-04-09 MED ORDER — HYDROCHLOROTHIAZIDE 25 MG PO TABS: 25.0000 mg | ORAL_TABLET | Freq: Every day | ORAL | Status: DC

## 2013-04-09 MED ORDER — SITAGLIPTIN PHOSPHATE 50 MG PO TABS: 50.0000 mg | ORAL_TABLET | Freq: Every day | ORAL | Status: DC

## 2013-04-09 MED ORDER — GUAIFENESIN-CODEINE 100-10 MG/5ML PO SYRP: 5.0000 mL | ORAL_SOLUTION | Freq: Three times a day (TID) | ORAL | Status: DC | PRN

## 2013-04-09 MED ORDER — OMEPRAZOLE MAGNESIUM 20 MG PO TBEC: 20.0000 mg | DELAYED_RELEASE_TABLET | Freq: Every day | ORAL | Status: DC

## 2013-04-09 NOTE — Assessment & Plan Note (Signed)
BP Readings from Last 3 Encounters:  04/09/13 185/85  01/23/13 150/92  01/22/13 150/88    Lab Results  Component Value Date   NA 137 01/22/2013   K 4.4 01/22/2013   CREATININE 1.36* 01/22/2013    Assessment: Blood pressure control: moderately elevated Progress toward BP goal:  deteriorated Comments: BP elevated today, after not taking HCTZ for several weeks  Plan: Medications:  Refilled HCTZ Educational resources provided: brochure Self management tools provided:   Other plans: Recheck at next visit.  Pt will likely need ACE-I, given proteinuria, though will wait until acute cough subsides.

## 2013-04-09 NOTE — Patient Instructions (Signed)
General Instructions: For your diabetes, your A1C is very high today at 9.7.  We will need to really prioritize blood sugar control to prevent damage to your eyes, kidneys, and nerves. -continue Glipizide at 10 mg twice per day -start Januvia, 1 tablet daily  Your blood pressure is also very high today. -we have refilled your Hydrochlorothiazide (HCTZ) medication today  For your knee pain, we recommend that you follow-up with Orthopedics. -we have given you a 1-time prescription for Tramadol, take 1 tablet every 6 hours as needed for pain  Your cough may represent uncontrolled acid reflux.  However, we need to make sure that this does not represent recurrence of your sarcoidosis. -we have ordered a chest x-ray.  This is a walk-in test at Radiology, on the 1st floor of this hospital -we recommend Omeprazole, 20 mg, 1 tablet daily  Please return for a follow-up visit in 2-3 weeks for a BP recheck.   Treatment Goals:  Goals (1 Years of Data) as of 04/09/13         As of Today 01/23/13 01/22/13 01/22/13 06/29/12     Blood Pressure    . Blood Pressure < 140/90  185/85 150/92 145/70 150/88 150/76     Result Component    . HEMOGLOBIN A1C < 7.0  9.7  8.8      . LDL CALC < 100    139        Progress Toward Treatment Goals:  Treatment Goal 04/09/2013  Hemoglobin A1C deteriorated  Blood pressure deteriorated    Self Care Goals & Plans:  Self Care Goal 04/09/2013  Manage my medications take my medicines as prescribed; refill my medications on time  Monitor my health keep track of my blood glucose; keep track of my blood pressure  Eat healthy foods eat baked foods instead of fried foods; eat foods that are low in salt  Be physically active take a walk every day    Home Blood Glucose Monitoring 01/22/2013  Check my blood sugar (No Data)  When to check my blood sugar N/A     Care Management & Community Referrals:  Referral 04/09/2013  Referrals made for care management support  none needed

## 2013-04-09 NOTE — Assessment & Plan Note (Addendum)
The patient has a history of left knee OA, followed by Orthopedics.  She has had at least 1 knee injection with Orthopedics, though she notes disappointment with results.  X-ray from 9 months ago shows tricompartment arthritis.  Discussed treatment options, and recommended voltaren, but patient refused, secondary to perceived ineffectiveness -Will try acute treatment with tramadol -pt to f/u with orthopedics

## 2013-04-09 NOTE — Assessment & Plan Note (Addendum)
Lab Results  Component Value Date   HGBA1C 9.7 04/09/2013   HGBA1C 8.8 01/22/2013   HGBA1C 8.8 06/15/2012     Assessment: Diabetes control: poor control (HgbA1C >9%) Progress toward A1C goal:  deteriorated Comments: DM has worsened from 8.8 to 9.7, likely due to admitted non-compliance with glipizide.  Pt has had reaction with metformin in the past.  We discussed insulin today, but pt very resistant.  Additionally, pt had proteinuria in the past, but is not on an ACE-I.  Pt will need ACE-I or ARB, but given her acute cough, will wait until this symptom resolves, since starting an ACE-I might lead to a false diagnosis of ACE-I induced cough if symptoms don't improve.  Plan: Medications:  Will increase glipizide back to 10 mg BID, and add Januvia 50 mg daily (decreased dose due to renal function) Home glucose monitoring: Frequency:   Timing:   Instruction/counseling given: reminded to bring blood glucose meter & log to each visit Educational resources provided: video;brochure Self management tools provided: copy of home glucose meter download Other plans: Recheck at next visit.  Will likely need insulin in the near future

## 2013-04-09 NOTE — Assessment & Plan Note (Signed)
The patient notes a 2-3 week history of cough, along with increased symptoms of GERD, and increased ibuprofen usage (for knee pain).  Differential for cough includes GERD-mediated vs Bronchitis.  No evidence of post-nasal drip.  Given patient's history of sarcoidosis, will check CXR. -ordered CXR - negative -prescribed omeprazole for GERD -cough syrup with codeine to break cough cycle

## 2013-04-09 NOTE — Assessment & Plan Note (Signed)
This has resolved for now, though patient was counseled on the importance of making another appointment with OB/GYN, after cancelling her last appointment.

## 2013-04-09 NOTE — Progress Notes (Signed)
HPI The patient is a 62 y.o. female with a history of HTN, DM, sarcoidosis, presenting for a follow-up visit for DM.  The patient notes chronic left knee pain.  X-ray from 06/2012 shows tricompartment osteoarthritis.  She has followed with Orthopedics, and has had knee injections in the past.  The patient had arthroscopic knee surgery 10 years ago for this knee.  She has been taking OTC ibuprofen for this knee pain.  At the patient's last visit, she was noted to have vaginal bleeding, prompting an ED visit 8/11.  The patient was supposed to return for a biopsy at the OB/GYN clinic last month, but she cancelled, due to financial concerns.  She notes no vaginal bleeding since that time.  The patient has a history of DM.  At her last visit, her A1C was 8.8, prompting an increase in glipizide to BID.  However, the patient notes "forgetting" about this change, and subsequently self-decreasing this to 1 tablet every other day, due to perceived side effects of cost.  At the patient's last visit, she was again noted to have proteinuria, which has been true since 2013.  A1C today is 9.7.  The patient is very resistant to starting insulin.  The patient notes a 2-3 week history of cough, occasionally productive of scant sputum (pt unsure of the color), along with mild SOB.  She notes no associated fevers, rhinorrhea, congestion, sinus pressure.  The patient notes having increased heartburn over the last couple of weeks, in the setting of increased ibuprofen usage.  The patient notes a fall.  The patient notes that 2 weeks ago, she was walking to the bathroom in the middle of the night, using a different cane then normal, when her cane slipped from underneath her, and she fell on her right arm.  She notes no preceding lightheadedness, nausea, or LOC.  The patient did not hit her head.  She notes persistent right upper arm pain as a result.  The patient's BP is significantly elevated today.  She has been out of HCTZ  for the last 3-4 weeks.  ROS: General: no fevers, chills, changes in weight, changes in appetite Skin: no rash HEENT: no blurry vision, hearing changes, sore throat Pulm: no wheezing CV: no chest pain, palpitations Abd: no abdominal pain, nausea/vomiting, diarrhea/constipation GU: no dysuria, hematuria, polyuria Ext: see HPI Neuro: no weakness, numbness, or tingling  Filed Vitals:   04/09/13 1404  BP: 185/85  Pulse: 91  Temp: 98.5 F (36.9 C)    PEX General: alert, cooperative, and in no apparent distress HEENT: pupils equal round and reactive to light, vision grossly intact, oropharynx clear and non-erythematous  Neck: supple, no lymphadenopathy Lungs: clear to ascultation bilaterally, normal work of respiration, no wheezes, rales, ronchi Heart: regular rate and rhythm, no murmurs, gallops, or rubs Abdomen: soft, non-tender, non-distended, normal bowel sounds Extremities: no cyanosis, clubbing, or edema.  Left knee with crepitus noted, as well as mild effusion. No focal ttp, and no erythema.  Right upper arm with faint bruise noted, with mild ttp, but no edema of arm. Neurologic: alert & oriented X3, cranial nerves II-XII intact, strength grossly intact, sensation intact to light touch  Current Outpatient Prescriptions on File Prior to Visit  Medication Sig Dispense Refill  . b complex vitamins tablet Take 2 tablets by mouth daily.      . ciprofloxacin (CIPRO) 500 MG tablet Take 1 tablet (500 mg total) by mouth 2 (two) times daily.  6 tablet  0  .  glipiZIDE (GLUCOTROL) 10 MG tablet Take 1 tablet (10 mg total) by mouth 2 (two) times daily before a meal.  60 tablet  3  . glucose blood (TRUETRACK TEST) test strip Use as instructed  100 each  12  . hydrochlorothiazide (HYDRODIURIL) 25 MG tablet Take 1 tablet (25 mg total) by mouth daily.  30 tablet  0  . Lancet Device MISC For True Track meter - use to check blood sugars once a week. Dx code: 250.00.  100 each  11  . Lancets  (ACCU-CHEK MULTICLIX) lancets Use as instructed  100 each  12  . medroxyPROGESTERone (PROVERA) 10 MG tablet Take 1 tablet (10 mg total) by mouth daily.  10 tablet  0   No current facility-administered medications on file prior to visit.    Assessment/Plan  Fall - fall mechanical in etiology, without associated lightheadedness or other symptoms.  Related to cane malfunction.  Pt has purchased a new cane. -if this happens again, can consider referring pt to PT

## 2013-04-11 ENCOUNTER — Other Ambulatory Visit: Payer: Self-pay | Admitting: *Deleted

## 2013-04-11 NOTE — Progress Notes (Signed)
Case discussed with Dr. Brown at the time of the visit.  We reviewed the resident's history and exam and pertinent patient test results.  I agree with the assessment, diagnosis, and plan of care documented in the resident's note. 

## 2013-04-11 NOTE — Telephone Encounter (Signed)
Fax from [facility] - need newrx for  Accu-chek multiclick lancets w/direction.  Thanks

## 2013-04-13 MED ORDER — ACCU-CHEK MULTICLIX LANCETS MISC: Status: DC

## 2013-04-16 ENCOUNTER — Other Ambulatory Visit: Payer: Self-pay | Admitting: *Deleted

## 2013-04-16 DIAGNOSIS — I10 Essential (primary) hypertension: Secondary | ICD-10-CM

## 2013-04-18 MED ORDER — SITAGLIPTIN PHOSPHATE 50 MG PO TABS: 50.0000 mg | ORAL_TABLET | Freq: Every day | ORAL | Status: DC

## 2013-04-18 MED ORDER — HYDROCHLOROTHIAZIDE 25 MG PO TABS: 25.0000 mg | ORAL_TABLET | Freq: Every day | ORAL | Status: DC

## 2013-04-23 ENCOUNTER — Ambulatory Visit: Payer: No Typology Code available for payment source | Admitting: Internal Medicine

## 2013-04-26 ENCOUNTER — Encounter: Payer: Self-pay | Admitting: Internal Medicine

## 2013-04-26 ENCOUNTER — Ambulatory Visit (INDEPENDENT_AMBULATORY_CARE_PROVIDER_SITE_OTHER): Payer: No Typology Code available for payment source | Admitting: Internal Medicine

## 2013-04-26 VITALS — BP 154/82 | HR 93 | Temp 98.2°F | Wt 248.1 lb

## 2013-04-26 DIAGNOSIS — R05 Cough: Secondary | ICD-10-CM

## 2013-04-26 DIAGNOSIS — E119 Type 2 diabetes mellitus without complications: Secondary | ICD-10-CM

## 2013-04-26 DIAGNOSIS — I10 Essential (primary) hypertension: Secondary | ICD-10-CM

## 2013-04-26 MED ORDER — LISINOPRIL 20 MG PO TABS: 20.0000 mg | ORAL_TABLET | Freq: Every day | ORAL | Status: DC

## 2013-04-26 NOTE — Patient Instructions (Signed)
We will send in lisinopril for you that will help to protect your kidneys and you should strongly think about filling it.   Come back in about 2 months with your doctor to check on the blood pressure and the sugars. Exercise to Stay Healthy Exercise helps you become and stay healthy. EXERCISE IDEAS AND TIPS Choose exercises that:  You enjoy.  Fit into your day. You do not need to exercise really hard to be healthy. You can do exercises at a slow or medium level and stay healthy. You can:  Stretch before and after working out.  Try yoga, Pilates, or tai chi.  Lift weights.  Walk fast, swim, jog, run, climb stairs, bicycle, dance, or rollerskate.  Take aerobic classes. Exercises that burn about 150 calories:  Running 1  miles in 15 minutes.  Playing volleyball for 45 to 60 minutes.  Washing and waxing a car for 45 to 60 minutes.  Playing touch football for 45 minutes.  Walking 1  miles in 35 minutes.  Pushing a stroller 1  miles in 30 minutes.  Playing basketball for 30 minutes.  Raking leaves for 30 minutes.  Bicycling 5 miles in 30 minutes.  Walking 2 miles in 30 minutes.  Dancing for 30 minutes.  Shoveling snow for 15 minutes.  Swimming laps for 20 minutes.  Walking up stairs for 15 minutes.  Bicycling 4 miles in 15 minutes.  Gardening for 30 to 45 minutes.  Jumping rope for 15 minutes.  Washing windows or floors for 45 to 60 minutes. Document Released: 07/03/2010 Document Revised: 08/23/2011 Document Reviewed: 07/03/2010 [facility] Patient Information 2014 [place], [place].

## 2013-04-26 NOTE — Assessment & Plan Note (Signed)
Patient is still having the same compliance issues as she was having previously. She states that she now does take the glipizide daily although she does not take it twice a day. She has not been able to fill Januvia. I did advise her to take glipizide twice a day and that she should take the Januvia. Also sent in a prescription for lisinopril 20 mg daily as her blood pressure was still not adequately controlled and her cough was improved from last visit. She was strongly advised that this will help with kidney protection and that she should fill this medication and start taking it. She seemed to be in agreement and said that should strongly consider filling the lisinopril. At next visit if she has started taking lisinopril a basic metabolic panel should be checked. Did talk with her briefly about the consequences of not controlling her diabetes and that she could end up on dialysis.

## 2013-04-26 NOTE — Progress Notes (Signed)
Subjective:     Patient ID: Wendy [name], female   DOB: [birth year], 62 y.o.   MRN: [id number]  HPI The patient is a 62 year old female comes in today for a blood pressure recheck. She has past medical history hypertension, hyperlipidemia, diabetes mellitus with renal complications, osteoarthritis of her knee, sarcoidosis. She states that she did not take her medications today however did refill her prescription for hydrochlorothiazide since last visit about 2 and half weeks ago. She did not realize she is seeing a doctor today, she thought that she was having a nursing visit for blood pressure recheck. She is adamant about having a very quick visit and does not really want to discuss any of her medical problems today. She states that she was not able to get Januvia since last visit. She also continues to take glipizide once daily. She states that her cough is much better although she occasionally does have coughing spells. She has tried the Prilosec for the coughing and she thinks it's helped.  Review of Systems  Constitutional: Negative for fever, chills, diaphoresis, activity change, appetite change, fatigue and unexpected weight change.  Respiratory: Negative for cough, chest tightness, shortness of breath and wheezing.   Cardiovascular: Negative for chest pain, palpitations and leg swelling.  Gastrointestinal: Negative for nausea, vomiting, abdominal pain and diarrhea.  Musculoskeletal: Positive for arthralgias. Negative for gait problem.  Skin: Negative.   Neurological: Negative for dizziness, tremors, seizures, syncope, facial asymmetry, speech difficulty, weakness, light-headedness, numbness and headaches.       Objective:   Physical Exam  Constitutional: She is oriented to person, place, and time. She appears well-developed and well-nourished. No distress.  HENT:  Head: Normocephalic and atraumatic.  Eyes: EOM are normal. Pupils are equal, round, and reactive to light.  Neck: Normal  range of motion. Neck supple.  Cardiovascular: Normal rate and regular rhythm.   No murmur heard. Pulmonary/Chest: Effort normal and breath sounds normal. No respiratory distress. She has no wheezes. She has no rales.  Abdominal: Soft. Bowel sounds are normal. She exhibits no distension. There is no tenderness. There is no rebound.  Musculoskeletal: Normal range of motion. She exhibits tenderness. She exhibits no edema.  Right knee with chronic pain  Neurological: She is alert and oriented to person, place, and time. No cranial nerve deficit.  Skin: Skin is warm and dry.       Assessment/Plan:   1. Please see problem oriented charting.  2. Disposition-patient will be seen back in 2 months with her PCP for blood pressure recheck and basic metabolic panel rechecked as this was soon as the patient would return. Will send in rx for lisinopril today and she was strongly encouraged to fill it for kidney protection and she seems to agree. Advised her to fill [place] and start taking glipizide twice a day. Counseled her that her kidneys are declining and she does not want to end up on dialysis.

## 2013-04-26 NOTE — Assessment & Plan Note (Signed)
Cough is much improved with Prilosec, this was continued. Feel as though it is safe to start ACE inhibitor.

## 2013-04-26 NOTE — Assessment & Plan Note (Signed)
The patient's blood pressure is still mildly elevated at today's visit. She states she did not take her hydrochlorothiazide this morning as she forgot what she was doing breakfast. Her blood pressure is 154/82 which is much improved from previous visit when she was in the 180s and was off her medication. Have sent in a prescription for lisinopril 20 mg daily which will hopefully help with her blood pressure compliance. If she is able to fill this medication before next visit a basic metabolic panel should be obtained.

## 2013-04-30 NOTE — Progress Notes (Signed)
Case discussed with Dr. Kollar at the time of the visit.  We reviewed the resident's history and exam and pertinent patient test results.  I agree with the assessment, diagnosis, and plan of care documented in the resident's note.     

## 2013-07-19 ENCOUNTER — Telehealth: Payer: Self-pay | Admitting: Internal Medicine

## 2013-07-19 NOTE — Telephone Encounter (Signed)
[facility] for [facility] Exam report.  Patient's last 2 ov were 03/14/2012 and 01/13/2012.  Notes have been faxed and rec'd.  No current appointments have been sch at this time.

## 2014-02-11 ENCOUNTER — Ambulatory Visit (INDEPENDENT_AMBULATORY_CARE_PROVIDER_SITE_OTHER): Payer: No Typology Code available for payment source | Admitting: Internal Medicine

## 2014-02-11 ENCOUNTER — Encounter: Payer: Self-pay | Admitting: Internal Medicine

## 2014-02-11 VITALS — BP 152/82 | HR 81 | Temp 98.3°F | Wt 247.7 lb

## 2014-02-11 DIAGNOSIS — E1139 Type 2 diabetes mellitus with other diabetic ophthalmic complication: Secondary | ICD-10-CM

## 2014-02-11 DIAGNOSIS — M1710 Unilateral primary osteoarthritis, unspecified knee: Secondary | ICD-10-CM

## 2014-02-11 DIAGNOSIS — M545 Low back pain, unspecified: Secondary | ICD-10-CM

## 2014-02-11 DIAGNOSIS — E1165 Type 2 diabetes mellitus with hyperglycemia: Secondary | ICD-10-CM

## 2014-02-11 DIAGNOSIS — IMO0002 Reserved for concepts with insufficient information to code with codable children: Secondary | ICD-10-CM

## 2014-02-11 DIAGNOSIS — G8929 Other chronic pain: Secondary | ICD-10-CM | POA: Insufficient documentation

## 2014-02-11 LAB — POCT GLYCOSYLATED HEMOGLOBIN (HGB A1C): Hemoglobin A1C: 9.7

## 2014-02-11 LAB — GLUCOSE, CAPILLARY: GLUCOSE-CAPILLARY: 363 mg/dL — AB (ref 70–99)

## 2014-02-11 MED ORDER — MELOXICAM 7.5 MG PO TABS: 15.0000 mg | ORAL_TABLET | Freq: Every day | ORAL | Status: DC

## 2014-02-11 MED ORDER — BACLOFEN 10 MG PO TABS
10.0000 mg | ORAL_TABLET | Freq: Every evening | ORAL | Status: DC | PRN
Start: 2014-02-11 — End: 2014-09-23

## 2014-02-11 MED ORDER — GLIPIZIDE 10 MG PO TABS: 10.0000 mg | ORAL_TABLET | Freq: Two times a day (BID) | ORAL | Status: DC

## 2014-02-11 MED ORDER — LATEX EXAM GLOVES MISC: Status: DC

## 2014-02-11 MED ORDER — CAPSAICIN 0.025 % EX CREA: TOPICAL_CREAM | Freq: Two times a day (BID) | CUTANEOUS | Status: DC

## 2014-02-11 MED ORDER — GLUCOSE BLOOD VI STRP: ORAL_STRIP | Status: DC

## 2014-02-11 NOTE — Patient Instructions (Addendum)
Start applying capsaicin cream to your knee for pain. Make sure to use latex gloves when applying this cream.  You can take baclofen 10mg  at bedtime for back pain.   Also, start taking meloxicam 15 mg once a day for back pain.   Start checking your blood sugar twice a day so that we can adjust your diabetes medication as necessary at your next visit. Make sure to bring your glucometer to your next visit.

## 2014-02-12 NOTE — Progress Notes (Signed)
Subjective:     Patient ID: Wendy [name], female   DOB: 1950/11/27, 63 y.o.   MRN: [id number]    HPI  Pt is a 63 y/o female w/ PMHx of HTN, DM2, HLD, and OA who presents to clinic for back pain, lt knee pain, and diabetes. Back pain started 2 weeks ago and starts at her tail bone down to her rt leg. Pain is a dull ached that has worsened over the past 2 weeks that she now rates as 8/10 in severity. For the past 4 days she stayed in her house due to pain. She states pain is better when she is laying down and aggravated by movement.   Lt knee pain: This is chronic in nature and she has seen ortho in the past who have recommended a knee replacement. They have tried corticosteroid injections which she states did not help, and she does not want to try steroid injections again. She had a lt knee xray in 2014 that revealed tricompartmental OA w/ effusion. She has tried tramadol which has helped w/ pain but caused nausea. Lt knee pain has started to affect her quality of life and she would like to pursue options for knee pain other than pills and surgery.   Diabetes: Pt's A1c was 9.7 today. She has been non complaint w/ her meds and has stopped taking [place] on her own. She currently on glipizide 10mg  daily. She does not check her CBGs b/c her glucometer is broken. Her diet consists of fried foods, sweets, bread, and occasional sweet ice teas.   Review of Systems  Constitutional: Positive for activity change. Negative for fever and chills.  Cardiovascular: Positive for leg swelling. Negative for chest pain.  Genitourinary:       Negative for inability to control bladder or BMs   Musculoskeletal: Positive for back pain and joint swelling.       Objective:   Physical Exam  Constitutional: She appears well-developed and well-nourished.  Cardiovascular: Normal rate and regular rhythm.   Pulmonary/Chest: Effort normal and breath sounds normal.  Abdominal: Soft. Bowel sounds are normal.   Musculoskeletal:  Tenderness to palpation of spinal cord area right above gluteal cleft. Mildly tender to palpation of lt knee, no effusion palpated. Able to bend left knee, and bend down to touch toes. Lt knee swollen compared to rt knee. Intact sensation to cotton ball on feet b/l. Equal strength in LE b/l.   Skin:  No bruises on back noted        Assessment:     ,Please see problem based assessment and plan.        Plan:     Please see problem based assessment and plan.

## 2014-02-12 NOTE — Assessment & Plan Note (Signed)
Lab Results  Component Value Date   HGBA1C 9.7 02/11/2014   HGBA1C 9.7 04/09/2013   HGBA1C 8.8 01/22/2013     Assessment: Diabetes control:  uncontrolled Progress toward A1C goal:   deteriorated Comments: noncompliant w/ meds- stopped taking januvia, only taking glipizde 42m Plan: Medications:  continue glipizide 153mdaily Home glucose monitoring: Frequency:  BID Timing:  am and pm Instruction/counseling given: reminded to bring blood glucose meter & log to each visit, discussed the need for weight loss and discussed diet Other plans: met w/ [facility] was given a free glucometer. rx for test strips given. Will f/u in 1 month to review glucometer and adjust diabetes meds as needed.

## 2014-02-12 NOTE — Assessment & Plan Note (Signed)
Pt with OA of lt knee. Pain has worsened to the point it is affecting her quality of life. She does not want surgery. Informed her that her other options include weight loss and medical management of pain. She has tried tramadol but stopped taking it b/c of nausea.   - rx for capsaicin 0.025% cream to apply to knee BID. Also given rx for latex gloves to use when applying cream

## 2014-02-12 NOTE — Assessment & Plan Note (Signed)
Pt complaining of lower back pain that goes down to rt leg. Unlikely associated w/ nerve impingement as pt describes pain as a dull ache that is not a shooting pain and instead is mainly located in lower back and then she will notice it in her right leg. Will treat for MSK etiology.   - rx for meloxicam 15mg  once a day and baclofen 10mg  qhs

## 2014-02-13 NOTE — Progress Notes (Signed)
I saw and evaluated the patient. I personally confirmed the key portions of Dr. Truong's history and exam and reviewed pertinent patient test results. The assessment, diagnosis, and plan were formulated together and I agree with the documentation in the resident's note. 

## 2014-03-07 ENCOUNTER — Other Ambulatory Visit: Payer: Self-pay | Admitting: *Deleted

## 2014-03-07 ENCOUNTER — Telehealth: Payer: Self-pay | Admitting: *Deleted

## 2014-03-07 DIAGNOSIS — I10 Essential (primary) hypertension: Secondary | ICD-10-CM

## 2014-03-07 MED ORDER — HYDROCHLOROTHIAZIDE 25 MG PO TABS: 25.0000 mg | ORAL_TABLET | Freq: Every day | ORAL | Status: DC

## 2014-03-07 NOTE — Telephone Encounter (Signed)
Talked with pt about capsaicin cream - [facility] does not cover OTC med and will not cover at local pharmacy. Gave pt info on name of cream and directions - will check with local pharmacy about OTC med. Hilda Blades Morine Kohlman RN 03/07/14 9:15AM

## 2014-03-07 NOTE — Telephone Encounter (Signed)
Pt aware.

## 2014-04-15 ENCOUNTER — Encounter: Payer: Self-pay | Admitting: Internal Medicine

## 2014-05-20 ENCOUNTER — Telehealth: Payer: Self-pay | Admitting: Dietician

## 2014-05-20 NOTE — Telephone Encounter (Signed)
Test strips were not covered by her insurance. She is going to call [facility] and ask for preferred meter/ what meter is best covered by her insurance and call CDE back for additional assistance.

## 2014-05-21 NOTE — Telephone Encounter (Signed)
Best Blood glucose meter Options per her insurance company-  One touch ultra blue $31.25, 50-$62.45  100-$123.86. I also suggested she consider the [facility] relion prime for 16$ and strips are 9$- 50, 18$ for 100. She verbalized understanding and was transferred to the health insurance marketplace navigator.

## 2014-07-02 ENCOUNTER — Other Ambulatory Visit: Payer: Self-pay | Admitting: *Deleted

## 2014-07-02 DIAGNOSIS — E119 Type 2 diabetes mellitus without complications: Secondary | ICD-10-CM

## 2014-07-03 MED ORDER — GLIPIZIDE 10 MG PO TABS: 10.0000 mg | ORAL_TABLET | Freq: Two times a day (BID) | ORAL | Status: DC

## 2014-07-11 ENCOUNTER — Other Ambulatory Visit: Payer: Self-pay | Admitting: Internal Medicine

## 2014-08-21 ENCOUNTER — Other Ambulatory Visit: Payer: Self-pay | Admitting: *Deleted

## 2014-08-21 DIAGNOSIS — I10 Essential (primary) hypertension: Secondary | ICD-10-CM

## 2014-08-22 ENCOUNTER — Encounter: Payer: Self-pay | Admitting: *Deleted

## 2014-08-22 MED ORDER — HYDROCHLOROTHIAZIDE 25 MG PO TABS: 25.0000 mg | ORAL_TABLET | Freq: Every day | ORAL | Status: DC

## 2014-08-26 ENCOUNTER — Telehealth: Payer: Self-pay | Admitting: Dietician

## 2014-08-26 DIAGNOSIS — E119 Type 2 diabetes mellitus without complications: Secondary | ICD-10-CM

## 2014-08-27 NOTE — Telephone Encounter (Signed)
Patient just got insurance and requests a prescription for test strips to see how much they will cost with insurance. Discussed possible deductible that may affect the cost of the strips  and also option of using [facility] prime meter or a comparable generic meter that is affordable.  She requests strips rx be sent to [facility] on [address].

## 2014-08-28 MED ORDER — GLUCOSE BLOOD VI STRP: ORAL_STRIP | Status: DC

## 2014-09-02 ENCOUNTER — Encounter: Payer: No Typology Code available for payment source | Admitting: Internal Medicine

## 2014-09-23 ENCOUNTER — Encounter: Payer: Self-pay | Admitting: Internal Medicine

## 2014-09-23 ENCOUNTER — Ambulatory Visit (INDEPENDENT_AMBULATORY_CARE_PROVIDER_SITE_OTHER): Payer: 59 | Admitting: Internal Medicine

## 2014-09-23 VITALS — BP 152/75 | HR 77 | Temp 98.1°F | Ht 64.0 in | Wt 238.5 lb

## 2014-09-23 DIAGNOSIS — M179 Osteoarthritis of knee, unspecified: Secondary | ICD-10-CM

## 2014-09-23 DIAGNOSIS — I10 Essential (primary) hypertension: Secondary | ICD-10-CM

## 2014-09-23 DIAGNOSIS — M1710 Unilateral primary osteoarthritis, unspecified knee: Secondary | ICD-10-CM

## 2014-09-23 DIAGNOSIS — IMO0002 Reserved for concepts with insufficient information to code with codable children: Secondary | ICD-10-CM

## 2014-09-23 DIAGNOSIS — E113299 Type 2 diabetes mellitus with mild nonproliferative diabetic retinopathy without macular edema, unspecified eye: Secondary | ICD-10-CM

## 2014-09-23 DIAGNOSIS — E11319 Type 2 diabetes mellitus with unspecified diabetic retinopathy without macular edema: Secondary | ICD-10-CM

## 2014-09-23 LAB — BASIC METABOLIC PANEL WITH GFR
BUN: 34 mg/dL — AB (ref 6–23)
CALCIUM: 9.5 mg/dL (ref 8.4–10.5)
CO2: 22 mEq/L (ref 19–32)
CREATININE: 1.73 mg/dL — AB (ref 0.50–1.10)
Chloride: 103 mEq/L (ref 96–112)
GFR, EST AFRICAN AMERICAN: 35 mL/min — AB
GFR, EST NON AFRICAN AMERICAN: 31 mL/min — AB
Glucose, Bld: 144 mg/dL — ABNORMAL HIGH (ref 70–99)
Potassium: 5.3 mEq/L (ref 3.5–5.3)
Sodium: 135 mEq/L (ref 135–145)

## 2014-09-23 LAB — LIPID PANEL
CHOL/HDL RATIO: 4.1 ratio
CHOLESTEROL: 240 mg/dL — AB (ref 0–200)
HDL: 59 mg/dL (ref >=46)
LDL Cholesterol: 163 mg/dL — ABNORMAL HIGH (ref 0–99)
TRIGLYCERIDES: 89 mg/dL (ref <150)
VLDL: 18 mg/dL (ref 0–40)

## 2014-09-23 LAB — GLUCOSE, CAPILLARY: GLUCOSE-CAPILLARY: 145 mg/dL — AB (ref 70–99)

## 2014-09-23 LAB — POCT GLYCOSYLATED HEMOGLOBIN (HGB A1C): Hemoglobin A1C: 8.6

## 2014-09-23 NOTE — Assessment & Plan Note (Signed)
Filed Vitals:   09/23/14 1417  BP: 152/75  Pulse: 77  Temp:    BP remains elevated 173/71 then recheck 152/75. Pt states home BP runs <140 sbp.  On hctz 25mg  daily.  Asked to continue hctz for now, will re-check in 1 month. Patient doesn't follow up regularly so I don't want to make too many changes at once. Will see how she does next time.

## 2014-09-23 NOTE — Progress Notes (Signed)
   Subjective:    Patient ID: Wendy [name], female    DOB: 1951/03/06, 64 y.o.   MRN: [id number]  HPI  64 yo female with uncontrolled DM II with retinopathy, HTN, left knee pain here for follow up of her chronic problems.  She continues to have left knee pain. Not using anything for it. Wants sports medicine referral.  Didn't bring her glucometer.   Saw optho reportedly at [facility] for her retinopathy, s/p injections. She was told that they can't do anything else.   See problem based a&p for other issues.   Review of Systems  Constitutional: Negative for fever, chills and fatigue.  HENT: Negative for sneezing and sore throat.   Eyes: Negative for photophobia and visual disturbance.  Respiratory: Negative for choking, chest tightness and shortness of breath.   Cardiovascular: Negative for chest pain and palpitations.  Gastrointestinal: Negative for nausea, vomiting, diarrhea and abdominal distention.  Endocrine: Negative for cold intolerance and polyuria.  Genitourinary: Negative for hematuria and dyspareunia.  Musculoskeletal: Positive for arthralgias.  Skin: Negative.   Neurological: Negative for dizziness and weakness.  Hematological: Negative.   Psychiatric/Behavioral: Negative for agitation.       Objective:   Physical Exam  Constitutional: She is oriented to person, place, and time. She appears well-developed and well-nourished. No distress.  HENT:  Head: Normocephalic and atraumatic.  Mouth/Throat: No oropharyngeal exudate.  Eyes: Conjunctivae and EOM are normal. Pupils are equal, round, and reactive to light. Right eye exhibits no discharge. Left eye exhibits no discharge.  Neck: Normal range of motion. Neck supple. No JVD present.  Cardiovascular: Normal rate and regular rhythm.  Exam reveals no gallop and no friction rub.   No murmur heard. Pulmonary/Chest: Effort normal and breath sounds normal. No stridor. No respiratory distress. She has no wheezes. She has  no rales.  Abdominal: Soft. Bowel sounds are normal. She exhibits no distension. There is no tenderness. There is no guarding.  Musculoskeletal: Normal range of motion. She exhibits no edema or tenderness.  Neurological: She is alert and oriented to person, place, and time. No cranial nerve deficit. Coordination normal.  Skin: Skin is warm and dry. She is not diaphoretic.   Performed diabetic foot exam. Normal.     Assessment & Plan:  See problem based a&p.

## 2014-09-23 NOTE — Assessment & Plan Note (Addendum)
Lab Results  Component Value Date   HGBA1C 8.6 09/23/2014   Today hgba1c 8.6, last time it was 9.7. Patient is trying to eat healthy (has cut down lot of carbs in her diet), tries to be active but left knee pain is limiting her. On glipizide at home 10mg  daily.  Will continue this. Asked her to bring her glucometer. Not sure what are readings has been. Will re-address this next visit. May benefit from metformin.   Checked urine microalbumin, lipid, BMET.  Performed diabetic foot exam. Normal.  Referred to opthalmology.

## 2014-09-23 NOTE — Assessment & Plan Note (Addendum)
Continues to have chronic pain on left knee. No radiation. Hurts to move around. Used steroid injection in the past which helped somewhat. Saw a sports medicine doctor before but their practice has closed. Wants referral to sports medicine.  Was on meloxicam last visit but not taking it. Not taking any meds for it now. Not using baclofen either.    Had knee xray 2014 showing Tricompartmental osteoarthritis, most marked medially, with effusion.   Exam is negative for TTP or effusion.    Will refer to sports medicine for possible knee injection.

## 2014-09-23 NOTE — Patient Instructions (Addendum)
We will check your hgba1c and address this next visit. Please bring your glucose meter with you next time.  Will check lipid panel, kidney function. Will refer to sports medicine and eye doctor.  Will recheck BP next time. Keep taking hctz.   General Instructions:   Please bring your medicines with you each time you come to clinic.  Medicines may include prescription medications, over-the-counter medications, herbal remedies, eye drops, vitamins, or other pills.   Progress Toward Treatment Goals:  Treatment Goal 04/09/2013  Hemoglobin A1C deteriorated  Blood pressure deteriorated    Self Care Goals & Plans:  Self Care Goal 09/23/2014  Manage my medications take my medicines as prescribed; bring my medications to every visit; refill my medications on time  Monitor my health keep track of my blood glucose; bring my glucose meter and log to each visit  Eat healthy foods drink diet soda or water instead of juice or soda; eat more vegetables; eat baked foods instead of fried foods  Be physically active find an activity I enjoy    Home Blood Glucose Monitoring 01/22/2013  Check my blood sugar (No Data)  When to check my blood sugar N/A     Care Management & Community Referrals:  Referral 09/23/2014  Referrals made for care management support -  Referrals made to community resources transportation

## 2014-09-24 LAB — MICROALBUMIN / CREATININE URINE RATIO
CREATININE, URINE: 158.5 mg/dL
MICROALB UR: 8.9 mg/dL — AB (ref <2.0)
MICROALB/CREAT RATIO: 56.2 mg/g — AB (ref 0.0–30.0)

## 2014-09-30 NOTE — Progress Notes (Signed)
Internal Medicine Clinic Attending  Case discussed with Dr. Ahmed soon after the resident saw the patient.  We reviewed the resident's history and exam and pertinent patient test results.  I agree with the assessment, diagnosis, and plan of care documented in the resident's note. 

## 2014-10-14 ENCOUNTER — Ambulatory Visit (INDEPENDENT_AMBULATORY_CARE_PROVIDER_SITE_OTHER): Payer: 59 | Admitting: Internal Medicine

## 2014-10-14 ENCOUNTER — Encounter: Payer: Self-pay | Admitting: Internal Medicine

## 2014-10-14 VITALS — BP 129/61 | HR 77 | Temp 98.4°F | Ht 64.0 in | Wt 237.8 lb

## 2014-10-14 DIAGNOSIS — E113299 Type 2 diabetes mellitus with mild nonproliferative diabetic retinopathy without macular edema, unspecified eye: Secondary | ICD-10-CM

## 2014-10-14 DIAGNOSIS — E1122 Type 2 diabetes mellitus with diabetic chronic kidney disease: Secondary | ICD-10-CM

## 2014-10-14 DIAGNOSIS — N189 Chronic kidney disease, unspecified: Secondary | ICD-10-CM | POA: Diagnosis not present

## 2014-10-14 DIAGNOSIS — E11319 Type 2 diabetes mellitus with unspecified diabetic retinopathy without macular edema: Secondary | ICD-10-CM | POA: Diagnosis not present

## 2014-10-14 DIAGNOSIS — N183 Chronic kidney disease, stage 3 unspecified: Secondary | ICD-10-CM

## 2014-10-14 DIAGNOSIS — I129 Hypertensive chronic kidney disease with stage 1 through stage 4 chronic kidney disease, or unspecified chronic kidney disease: Secondary | ICD-10-CM

## 2014-10-14 DIAGNOSIS — N1831 Chronic kidney disease, stage 3a: Secondary | ICD-10-CM | POA: Insufficient documentation

## 2014-10-14 DIAGNOSIS — I10 Essential (primary) hypertension: Secondary | ICD-10-CM

## 2014-10-14 DIAGNOSIS — M79602 Pain in left arm: Secondary | ICD-10-CM | POA: Insufficient documentation

## 2014-10-14 HISTORY — DX: Chronic kidney disease, stage 3a: N18.31

## 2014-10-14 LAB — BASIC METABOLIC PANEL WITH GFR
BUN: 39 mg/dL — ABNORMAL HIGH (ref 6–23)
CALCIUM: 9.4 mg/dL (ref 8.4–10.5)
CO2: 25 mEq/L (ref 19–32)
CREATININE: 1.67 mg/dL — AB (ref 0.50–1.10)
Chloride: 103 mEq/L (ref 96–112)
GFR, EST AFRICAN AMERICAN: 37 mL/min — AB
GFR, Est Non African American: 32 mL/min — ABNORMAL LOW
GLUCOSE: 143 mg/dL — AB (ref 70–99)
Potassium: 4.5 mEq/L (ref 3.5–5.3)
Sodium: 139 mEq/L (ref 135–145)

## 2014-10-14 NOTE — Progress Notes (Signed)
   Subjective:    Patient ID: Wendy [name], female    DOB: February 17, 1951, 64 y.o.   MRN: [id number]  HPI  64 yo female with HTN, DM II, osteoarthritis, here for follow up of her DM II/HTN, also describes having left knee pain that we discussed last visit (has appt with sports medicine). Also having some arm pain, started 3 weeks ago, no injuries. No weakness, numbness. Pain is mild, feels like muscle cramps.   Last hgba1c 8.6 3 weeks ago. Glucometer at home readings mainly 100-160 range. Lowest is 72.   On glipizide 10mg  daily now.  BP good today 129/61 on HCTZ.   Review of Systems  Constitutional: Negative for fever and chills.  HENT: Negative for sinus pressure and sore throat.   Eyes: Negative for visual disturbance.  Respiratory: Negative for chest tightness and shortness of breath.   Cardiovascular: Negative for chest pain, palpitations and leg swelling.  Gastrointestinal: Negative for abdominal pain and abdominal distention.  Endocrine: Negative.   Genitourinary: Negative for dysuria and difficulty urinating.  Musculoskeletal: Positive for myalgias and arthralgias. Negative for joint swelling, neck pain and neck stiffness.  Allergic/Immunologic: Negative.   Neurological: Negative for dizziness and weakness.       Objective:   Physical Exam  Constitutional: She is oriented to person, place, and time. She appears well-developed and well-nourished. No distress.  HENT:  Head: Normocephalic and atraumatic.  Right Ear: External ear normal.  Left Ear: External ear normal.  Eyes: Conjunctivae are normal. Pupils are equal, round, and reactive to light.  Neck: Normal range of motion.  Cardiovascular: Normal rate, regular rhythm, normal heart sounds and intact distal pulses.  Exam reveals no gallop and no friction rub.   No murmur heard. Pulmonary/Chest: Effort normal and breath sounds normal. No respiratory distress. She has no wheezes. She has no rales. She exhibits no tenderness.    Abdominal: Soft. She exhibits no distension. There is no tenderness.  Musculoskeletal: Normal range of motion.  Full ROM on left shoulder. Negative empty can test and negative drop arm test. No tenderness, warmth, erythema on the shoulder joints. 5/5 strength bilaterally.  No neck stiffness or tenderness.  Neurological: She is alert and oriented to person, place, and time. No cranial nerve deficit. Coordination normal.  Skin: She is not diaphoretic.   Filed Vitals:   10/14/14 1558  BP: 129/61  Pulse: 77  Temp: 98.4 F (36.9 C)         Assessment & Plan:  See problem based a&p.

## 2014-10-14 NOTE — Assessment & Plan Note (Signed)
CBG in 100-160 ranges mostly, some low in 70's. No hypoglycemic symptoms. Feels tired occasionally.  Will recheck BMET today, may lower her dose of glipizide 10mg  daily if kidney function is not improving. May add some other meds such as prandin meal time to her glipizide.

## 2014-10-14 NOTE — Assessment & Plan Note (Signed)
Started 3 weeks ago, no injuries. Has full ROM on exam, no neuro deficits, negative drop arm test and empty can test.  Likely muscle spasm from over use as patient often puts much pressure on the arms to climb stairs as she has knee pain.  Asked to try tylenol and heat packs. Also asked to discuss this with sports medicine during her upcoming appt.

## 2014-10-14 NOTE — Assessment & Plan Note (Signed)
Filed Vitals:   10/14/14 1558  BP: 129/61  Pulse: 77  Temp: 98.4 F (36.9 C)    BP under control with hctz 25mg  daily. Continue this.

## 2014-10-14 NOTE — Assessment & Plan Note (Signed)
Baseline crt ~1.4, last was 1.7 on 09/23/2014. Will recheck BMET today.

## 2014-10-14 NOTE — Patient Instructions (Signed)
Will check your kidney function. If it's not improving, may need to change your diabetes medication.  Keep taking same meds for now.

## 2014-10-15 NOTE — Progress Notes (Signed)
INTERNAL MEDICINE TEACHING ATTENDING ADDENDUM - Williamson Cavanah, MD: I reviewed and discussed at the time of visit with the resident Dr. Ahmed, the patient's medical history, physical examination, diagnosis and results of pertinent tests and treatment and I agree with the patient's care as documented.  

## 2014-10-21 ENCOUNTER — Ambulatory Visit (HOSPITAL_COMMUNITY)
Admission: RE | Admit: 2014-10-21 | Discharge: 2014-10-21 | Disposition: A | Discharge status: Home / Self Care | Payer: 59 | Source: Ambulatory Visit | Attending: Sports Medicine | Admitting: Sports Medicine

## 2014-10-21 ENCOUNTER — Encounter: Payer: No Typology Code available for payment source | Admitting: Internal Medicine

## 2014-10-21 ENCOUNTER — Encounter: Payer: Self-pay | Admitting: Sports Medicine

## 2014-10-21 ENCOUNTER — Ambulatory Visit (INDEPENDENT_AMBULATORY_CARE_PROVIDER_SITE_OTHER): Payer: 59 | Admitting: Sports Medicine

## 2014-10-21 VITALS — BP 136/81 | Ht 64.0 in | Wt 235.0 lb

## 2014-10-21 DIAGNOSIS — M25562 Pain in left knee: Secondary | ICD-10-CM | POA: Diagnosis present

## 2014-10-21 DIAGNOSIS — G8929 Other chronic pain: Secondary | ICD-10-CM | POA: Insufficient documentation

## 2014-10-21 DIAGNOSIS — D869 Sarcoidosis, unspecified: Secondary | ICD-10-CM | POA: Insufficient documentation

## 2014-10-21 DIAGNOSIS — M179 Osteoarthritis of knee, unspecified: Secondary | ICD-10-CM | POA: Insufficient documentation

## 2014-10-21 DIAGNOSIS — I10 Essential (primary) hypertension: Secondary | ICD-10-CM | POA: Insufficient documentation

## 2014-10-21 DIAGNOSIS — M1712 Unilateral primary osteoarthritis, left knee: Secondary | ICD-10-CM

## 2014-10-21 MED ORDER — METHYLPREDNISOLONE ACETATE 40 MG/ML IJ SUSP
40.0000 mg | Freq: Once | INTRAMUSCULAR | Status: AC
  Administered 2014-10-21: 40 mg via INTRA_ARTICULAR

## 2014-10-21 NOTE — Progress Notes (Signed)
   Subjective:    Patient ID: Wendy [name], female    DOB: 11-10-1950, 64 y.o.   MRN: [id number]  HPI chief complaint: Left knee pain  Pleasant 64 year old female comes in today complaining of long-standing left knee pain. She is status post left knee arthroscopy done 15 years ago. Since then she has had pain in the knee but it was tolerable up until 3-4 years ago. Since that time it has begun to progress. She describes a diffuse pain along with swelling that is worse with activity. In fact it is so severe that she has limited her time away from her home. She has had multiple medications, cortisone injections, and physical therapy. She was being treated by an orthopedist in [facility] but he has since left. She was told that she would at some point need a total knee replacement. She is now starting to strongly consider that. Her last cortisone injection was about a year ago and she developed some post injection vaginal bleeding for the following 7-8 days. She denies any recent trauma. X-rays of her left knee were done in 2014. They were non-standing films which showed a moderate amount of medial compartmental narrowing with osteophyte formation. She has not had any recent x-rays.  Past medical history reviewed. Significant for past sarcoidosis, current diabetes, chronic kidney disease, and hypertension. Medications reviewed Allergies reviewed    Review of Systems As above    Objective:   Physical Exam Well-developed, obese. No acute distress. Awake alert and oriented 3. Vital signs reviewed  Left knee: Range of motion is 0-130. 1+ boggy synovitis and bony hypertrophy consistent with osteoarthritis. She is tender to palpation along the medial joint line with pain but no popping with McMurray's. No significant tenderness laterally. 2+ patellofemoral crepitus. Mild varus deformity. Neurovascularly intact distally. Walking with a slight limp.  X-rays from 2014 of the left knee are as  above       Assessment & Plan:  Left knee pain secondary to DJD  I had a long talk with the patient today regarding her left knee predicament. She is contemplating knee replacement so I have given her the names of Dr.Aluisio and Dr Mayer Camel. She would also like to try a repeat cortisone injection. She does understand that there is a risk of returning post injection vaginal bleeding since she has experienced this previously. She is willing to take that risk. I would like to get some updated x-rays of her knees and see her back in the office in 3 weeks. Depending on her response to today's cortisone injection we may discuss further referral for total knee arthroplasty. She will call with questions or concerns in the interim. Total time spent with the patient was 30 minutes with greater than 50% of the time spent in face-to-face consultation.  Consent obtained and verified. Time-out conducted. Noted no overlying erythema, induration, or other signs of local infection. Skin prepped in a sterile fashion. Topical analgesic spray: Ethyl chloride. Joint: left knee, anterolateral approach Needle: 22g 1.5 inch Completed without difficulty. Meds: 3cc 1%xyloacaine, 1cc (40mg ) depomedrol  Advised to call if fevers/chills, erythema, induration, drainage, or persistent bleeding.

## 2014-10-28 ENCOUNTER — Telehealth: Payer: Self-pay | Admitting: *Deleted

## 2014-10-28 NOTE — Telephone Encounter (Signed)
Pt called - past 3 days about 1AM - shaking. CBG this AM 63. Had a shot of cortisone by Sports Medicine. Takes glipizide once daily about supper time. Pt is trying to lose wt. Appt made 10/30/14 2:45PM Dr Algis Liming to bring meter. Pt insist on Wed appt.  Suggest to keep log book CBG, food and occuring low CBG. To call clinic if any change.  Hilda Blades Kalilah Barua RN 10/28/14 1:45PM

## 2014-10-30 ENCOUNTER — Ambulatory Visit (INDEPENDENT_AMBULATORY_CARE_PROVIDER_SITE_OTHER): Payer: 59 | Admitting: Internal Medicine

## 2014-10-30 ENCOUNTER — Ambulatory Visit: Payer: 59 | Admitting: Internal Medicine

## 2014-10-30 ENCOUNTER — Encounter: Payer: Self-pay | Admitting: Internal Medicine

## 2014-10-30 VITALS — BP 119/62 | HR 80 | Temp 98.3°F | Ht 64.0 in | Wt 235.9 lb

## 2014-10-30 DIAGNOSIS — E11319 Type 2 diabetes mellitus with unspecified diabetic retinopathy without macular edema: Secondary | ICD-10-CM

## 2014-10-30 DIAGNOSIS — E113299 Type 2 diabetes mellitus with mild nonproliferative diabetic retinopathy without macular edema, unspecified eye: Secondary | ICD-10-CM

## 2014-10-30 MED ORDER — GLIPIZIDE 5 MG PO TABS: 5.0000 mg | ORAL_TABLET | Freq: Two times a day (BID) | ORAL | Status: DC

## 2014-10-30 NOTE — Progress Notes (Signed)
   Subjective:   Patient ID: Wendy [name] female   DOB: 04/09/1951 64 y.o.   MRN: [id number]  HPI: [name] is a 64 y.o. woman pmh as listed below presents for hypoglycemia.   Pt stated that over the weekend she was baby sitting her grandchildren and extremely stressed and busy. During this time she did not eat well but continue to take the full dose of her medications. On Monday she felt externally fatigued, "foggy brained" and slightly dizzy. During this time she did check her blood sugars which revealed a 66. She then ate a peppermint and had full resolution of her symptoms. She has not had anymore hyperglycemic events or sensation such as this since that time. She did not have any CP, SOB, HA, blurry vision, or syncope.    Past Medical History  Diagnosis Date  . Sarcoidosis   . Hypertension   . Diabetes mellitus    Current Outpatient Prescriptions  Medication Sig Dispense Refill  . Disposable Gloves (LATEX EXAM GLOVES) MISC Use gloves when applying capsaicin cream 100 each 0  . glipiZIDE (GLUCOTROL) 10 MG tablet Take 1 tablet (10 mg total) by mouth 2 (two) times daily before a meal. 180 tablet 3  . glucose blood test strip Check blood sugar one time a day 50 each 12  . hydrochlorothiazide (HYDRODIURIL) 25 MG tablet Take 1 tablet (25 mg total) by mouth daily. 90 tablet 1  . Lancet Device MISC For True Track meter - use to check blood sugars once a week. Dx code: 250.00. 100 each 11  . Lancets (ACCU-CHEK MULTICLIX) lancets Use as instructed 100 each 12  . Lancets (ACCU-CHEK MULTICLIX) lancets Use to Check Blood Sugars Once a Week.Dx Code: 250.00. 100 each 12   No current facility-administered medications for this visit.   No family history on file. History   Social History  . Marital Status: Married    Spouse Name: N/A  . Number of Children: N/A  . Years of Education: N/A   Social History Main Topics  . Smoking status: Former Smoker    Quit date: 08/09/1976  .  Smokeless tobacco: Not on file  . Alcohol Use: No  . Drug Use: No  . Sexual Activity: Not on file   Other Topics Concern  . None   Social History Narrative   Review of Systems: A comprehensive review of systems was negative. Objective:  Physical Exam: Filed Vitals:   10/30/14 1504  BP: 119/62  Pulse: 80  Temp: 98.3 F (36.8 C)  TempSrc: Oral  Height: 5\' 4"  (1.626 m)  Weight: 235 lb 14.4 oz (107.004 kg)  SpO2: 100%   General: sitting in chair, NAD Cardiac: RRR, no rubs, murmurs or gallops Pulm: clear to auscultation bilaterally, no crackles or wheezes or rhonchi, moving normal volumes of air Abd: soft, nontender, nondistended, BS present Ext: warm and well perfused, no pedal edema  Assessment & Plan:  Please see problem oriented charting  Pt discussed with Dr. Daryll Drown

## 2014-10-30 NOTE — Assessment & Plan Note (Signed)
Lab Results  Component Value Date   HGBA1C 8.6 09/23/2014   HGBA1C 9.7 02/11/2014   HGBA1C 9.7 04/09/2013     Assessment: Diabetes control:   Progress toward A1C goal:    Comments: meter was reviewed with the patient that did show 1 event of 66 on the day she didn't eat dinner but took her meds when watching her grandchildren  Plan: Medications:  Decreased glipizide 5mg  BID Home glucose monitoring: Frequency:   Timing:   Instruction/counseling given: reminded to bring blood glucose meter & log to each visit, reminded to bring medications to each visit and discussed diet Educational resources provided:   Self management tools provided: copy of home glucose meter download Other plans: f/u in 2-4 wks for titration, education provided for hypoglycemia management

## 2014-10-30 NOTE — Patient Instructions (Signed)
General Instructions:   Thank you for bringing your medicines today. This helps [place] keep you safe from mistakes.   For your diabetes:  1. We will change your medicine to the glipizide 5mg  twice a day 2. Follow up in 2-4 weeks unless you have another low blood sugar (less than 70) then call and come in for [place] to check how you are doing   Progress Toward Treatment Goals:  Treatment Goal 04/09/2013  Hemoglobin A1C deteriorated  Blood pressure deteriorated    Self Care Goals & Plans:  Self Care Goal 10/30/2014  Manage my medications take my medicines as prescribed; bring my medications to every visit; refill my medications on time  Monitor my health keep track of my blood glucose; bring my glucose meter and log to each visit; keep track of my blood pressure  Eat healthy foods eat more vegetables; eat foods that are low in salt; eat baked foods instead of fried foods  Be physically active find an activity I enjoy    Home Blood Glucose Monitoring 01/22/2013  Check my blood sugar (No Data)  When to check my blood sugar N/A     Care Management & Community Referrals:  Referral 09/23/2014  Referrals made for care management support -  Referrals made to community resources transportation

## 2014-10-31 NOTE — Progress Notes (Signed)
Internal Medicine Clinic Attending  Case discussed with Dr. Sadek soon after the resident saw the patient.  We reviewed the resident's history and exam and pertinent patient test results.  I agree with the assessment, diagnosis, and plan of care documented in the resident's note. 

## 2014-11-04 LAB — GLUCOSE, CAPILLARY: GLUCOSE-CAPILLARY: 166 mg/dL — AB (ref 65–99)

## 2014-11-13 ENCOUNTER — Ambulatory Visit (INDEPENDENT_AMBULATORY_CARE_PROVIDER_SITE_OTHER): Payer: 59 | Admitting: Sports Medicine

## 2014-11-13 VITALS — BP 121/70 | Ht 64.0 in | Wt 230.0 lb

## 2014-11-13 DIAGNOSIS — M1712 Unilateral primary osteoarthritis, left knee: Secondary | ICD-10-CM

## 2014-11-13 NOTE — Patient Instructions (Signed)
[employer]   Medial Unloader Brace

## 2014-11-14 NOTE — Progress Notes (Signed)
Patient ID: Wendy [name], female   DOB: 06-Oct-1950, 64 y.o.   MRN: [id number]  Patient comes in today to discuss x-rays of her left knee. X-rays demonstrate bone-on-bone end-stage medial compartmental DJD. Unfortunately she did not experience much symptom relief from a recent cortisone injection. Physical exam was not repeated. We simply talked about her ongoing left knee dilemma. I again explained that definitive treatment is in the form of either a total knee arthroplasty or perhaps a unicompartmental replacement. She is still not quite ready to pursue surgery yet. I also gave her the option of a medial unloader brace and she would like to do some research on this on her own. She will let me know if she would like for me to set her up an appointment with our [employer] representative to discuss this further. Otherwise, follow-up as needed.  Total time spent with the patient was 10 minutes with 100% the time spent in face-to-face consultation discussing her diagnosis and treatment options.

## 2015-03-18 ENCOUNTER — Ambulatory Visit (INDEPENDENT_AMBULATORY_CARE_PROVIDER_SITE_OTHER): Payer: 59 | Admitting: Internal Medicine

## 2015-03-18 ENCOUNTER — Encounter: Payer: Self-pay | Admitting: Internal Medicine

## 2015-03-18 VITALS — BP 93/73 | HR 78 | Temp 97.7°F | Ht 64.0 in | Wt 235.0 lb

## 2015-03-18 DIAGNOSIS — Z7984 Long term (current) use of oral hypoglycemic drugs: Secondary | ICD-10-CM | POA: Diagnosis not present

## 2015-03-18 DIAGNOSIS — E113299 Type 2 diabetes mellitus with mild nonproliferative diabetic retinopathy without macular edema, unspecified eye: Secondary | ICD-10-CM

## 2015-03-18 DIAGNOSIS — E1165 Type 2 diabetes mellitus with hyperglycemia: Secondary | ICD-10-CM | POA: Diagnosis not present

## 2015-03-18 DIAGNOSIS — M170 Bilateral primary osteoarthritis of knee: Secondary | ICD-10-CM

## 2015-03-18 DIAGNOSIS — IMO0002 Reserved for concepts with insufficient information to code with codable children: Secondary | ICD-10-CM

## 2015-03-18 DIAGNOSIS — I10 Essential (primary) hypertension: Secondary | ICD-10-CM | POA: Diagnosis not present

## 2015-03-18 DIAGNOSIS — E11319 Type 2 diabetes mellitus with unspecified diabetic retinopathy without macular edema: Secondary | ICD-10-CM | POA: Diagnosis not present

## 2015-03-18 DIAGNOSIS — M1710 Unilateral primary osteoarthritis, unspecified knee: Secondary | ICD-10-CM

## 2015-03-18 LAB — GLUCOSE, CAPILLARY: Glucose-Capillary: 133 mg/dL — ABNORMAL HIGH (ref 65–99)

## 2015-03-18 LAB — POCT GLYCOSYLATED HEMOGLOBIN (HGB A1C): Hemoglobin A1C: 9.4

## 2015-03-18 MED ORDER — GLIPIZIDE 5 MG PO TABS: 5.0000 mg | ORAL_TABLET | Freq: Two times a day (BID) | ORAL | Status: DC

## 2015-03-18 MED ORDER — HYDROCHLOROTHIAZIDE 12.5 MG PO CAPS: 12.5000 mg | ORAL_CAPSULE | Freq: Every day | ORAL | Status: DC

## 2015-03-18 NOTE — Patient Instructions (Signed)
Ms. Germer it was nice meeting you today.  -Take Hydrochlorothiazide 12.5 mg: 1 tablet by mouth daily for blood pressure  -Take Glipizide 5 mg: 1 tablet by mouth two times daily for diabetes  -Please return for a follow up visit in 1 month.

## 2015-03-19 NOTE — Assessment & Plan Note (Signed)
BP Readings from Last 3 Encounters:  03/18/15 93/73  11/13/14 121/70  10/30/14 119/62    Lab Results  Component Value Date   NA 139 10/14/2014   K 4.5 10/14/2014   CREATININE 1.67* 10/14/2014    Assessment: Blood pressure control:  BP soft today Comments: Patient is currently on HCTZ 25 mg qd. Denies any lightheadedness or CP.   Plan: Medications: Dose changed to HCTZ 12.5 mg qd.

## 2015-03-19 NOTE — Assessment & Plan Note (Signed)
Patient states she went to sports medicine and they recommended total knee replacement. States she is not interested in surgery at this time. Note from sports med (11/14/14) also mentions patient being given the option of a medial unloader brace. Patient states she is not interested in wearing a brace. She was offered a referral to PT today but declined it. Physical exam revealed normal ROM of knees.  -Patient has been advised to follow-up with sports med.

## 2015-03-19 NOTE — Assessment & Plan Note (Addendum)
Lab Results  Component Value Date   HGBA1C 9.4 03/18/2015   HGBA1C 8.6 09/23/2014   HGBA1C 9.7 02/11/2014     Assessment: Diabetes control:  poor Progress toward A1C goal:   above goal (A1c <7) Comments: Patient states she has been "out of control and eating everything" and denies symptoms of hypoglycemia. States she does not check her blood glucose anymore and did not bring her meter with her today. She is supposed to be on Glipizide 5 mg bid but states she is taking the medication only once daily. Patient reports having numbness and tingling in her toes.    Plan: Medications: Glipizide 5 mg bid Instruction/counseling given: Patient was offered a consultation with diabetes educator Butch Penny today but she declined. She has been advised to cut down on carbohydrates in her diet. In addition, she has been advised to start checking her blood glucose regularly and bring her log and meter with her to the next visit.  Other plans:  -Re-check A1c in 3 months -Opthalmology referral today

## 2015-03-19 NOTE — Progress Notes (Signed)
Patient ID: Wendy [name], female   DOB: 07/02/1950, 64 y.o.   MRN: [id number]   Subjective:   Patient ID: Wendy [name] female   DOB: [birth year] 64 y.o.   MRN: [id number]  HPI: [name] is a 64 y.o. F with a PMHx of sarcoidosis, HTN, DM, and chronic knee pain presenting for a follow-up of her HTN, diabetes, and chronic knee pain. Please refer to the assessment and plan section for the status of the patient's chronic medical problems.     Past Medical History  Diagnosis Date  . Sarcoidosis ([place])   . Hypertension   . Diabetes mellitus    Current Outpatient Prescriptions  Medication Sig Dispense Refill  . Disposable Gloves (LATEX EXAM GLOVES) MISC Use gloves when applying capsaicin cream 100 each 0  . glipiZIDE (GLUCOTROL) 5 MG tablet Take 1 tablet (5 mg total) by mouth 2 (two) times daily before a meal. 180 tablet 1  . glucose blood test strip Check blood sugar one time a day 50 each 12  . hydrochlorothiazide (MICROZIDE) 12.5 MG capsule Take 1 capsule (12.5 mg total) by mouth daily. 90 capsule 1  . Lancet Device MISC For True Track meter - use to check blood sugars once a week. Dx code: 250.00. 100 each 11  . Lancets (ACCU-CHEK MULTICLIX) lancets Use as instructed 100 each 12   No current facility-administered medications for this visit.   No family history on file. Social History   Social History  . Marital Status: Married    Spouse Name: N/A  . Number of Children: N/A  . Years of Education: N/A   Social History Main Topics  . Smoking status: Former Smoker    Quit date: 08/09/1976  . Smokeless tobacco: None  . Alcohol Use: No  . Drug Use: No  . Sexual Activity: Not Asked   Other Topics Concern  . None   Social History Narrative   Review of Systems: Review of Systems  Constitutional: Negative for fever and chills.  HENT: Negative for ear pain.   Eyes: Negative for blurred vision and pain.  Respiratory: Negative for cough, shortness of breath and  wheezing.   Cardiovascular: Negative for chest pain and leg swelling.  Gastrointestinal: Negative for nausea, vomiting, abdominal pain, diarrhea and constipation.  Genitourinary: Negative for dysuria, urgency and frequency.  Musculoskeletal: Negative for myalgias.  Skin: Negative for itching and rash.  Neurological: Positive for tingling and sensory change. Negative for dizziness, focal weakness and headaches.    Objective:  Physical Exam: Filed Vitals:   03/18/15 0914  BP: 93/73  Pulse: 78  Temp: 97.7 F (36.5 C)  TempSrc: Oral  Height: 5\' 4"  (1.626 m)  Weight: 235 lb (106.595 kg)  SpO2: 100%   Physical Exam  Constitutional: She is oriented to person, place, and time. She appears well-developed and well-nourished. No distress.  HENT:  Head: Normocephalic and atraumatic.  Eyes: EOM are normal. Pupils are equal, round, and reactive to light.  Neck: Neck supple. No tracheal deviation present.  Cardiovascular: Normal rate, regular rhythm and intact distal pulses.   Pulmonary/Chest: Effort normal. No respiratory distress. She has no wheezes. She has no rales.  Abdominal: Soft. Bowel sounds are normal. She exhibits no distension. There is no tenderness.  Musculoskeletal: She exhibits no edema.  Normal ROM of knees b/l  Neurological: She is alert and oriented to person, place, and time.  Skin: Skin is warm and dry.   Assessment & Plan:

## 2015-04-29 ENCOUNTER — Encounter: Payer: Self-pay | Admitting: Student

## 2015-05-06 LAB — HM DIABETES EYE EXAM

## 2015-05-12 ENCOUNTER — Ambulatory Visit (INDEPENDENT_AMBULATORY_CARE_PROVIDER_SITE_OTHER): Payer: 59 | Admitting: Internal Medicine

## 2015-05-12 ENCOUNTER — Encounter: Payer: Self-pay | Admitting: Internal Medicine

## 2015-05-12 VITALS — BP 166/69 | HR 72 | Temp 97.7°F | Ht 64.0 in | Wt 242.0 lb

## 2015-05-12 DIAGNOSIS — M1712 Unilateral primary osteoarthritis, left knee: Secondary | ICD-10-CM | POA: Diagnosis not present

## 2015-05-12 DIAGNOSIS — N183 Chronic kidney disease, stage 3 unspecified: Secondary | ICD-10-CM

## 2015-05-12 DIAGNOSIS — I10 Essential (primary) hypertension: Secondary | ICD-10-CM

## 2015-05-12 DIAGNOSIS — E113299 Type 2 diabetes mellitus with mild nonproliferative diabetic retinopathy without macular edema, unspecified eye: Secondary | ICD-10-CM

## 2015-05-12 LAB — GLUCOSE, CAPILLARY: GLUCOSE-CAPILLARY: 160 mg/dL — AB (ref 65–99)

## 2015-05-12 NOTE — Patient Instructions (Signed)
Please make sure you take your glipizide twice a day every day.  Check your blood sugar three times a day. Please bring your meter with you next time.  Think about the knee surgery. Let us know if we can help.  Work on [employer] and reducing stress.  Follow up in 2 months.

## 2015-05-12 NOTE — Progress Notes (Signed)
   Subjective:    Patient ID: Wendy [name], female    DOB: [birth year], 64 y.o.   MRN: [id number]  HPI  63 yo female with hx of HTN, uncontrolled DM II, sarcoidosis, HLD, CKD III here to follow up on her CKD II, HTN, and DM II.  She states she recently has been going through lot of stress at home, especially with her son. She has not taken care of her health or paid attention to her diet at all. Not measuring her blood sugar, not taking her glipizide regularly either.   She went to the sports medicine clinic for her left knee pain. They have tried steroid injection in the past without any benefit. They offered her knee brace but she does not feel that would be helpful. She thinks PT will also cause too much pain. They also offered her surgical option but she is not ready for that yet.   She wants to start fresh again and will start watching her diet, take her meds and take control of her health again.   Review of Systems  Constitutional: Negative for fever and chills.  HENT: Negative for congestion and sore throat.   Eyes: Negative for pain, discharge and visual disturbance.  Respiratory: Negative for cough, chest tightness, shortness of breath and wheezing.   Cardiovascular: Negative for chest pain, palpitations and leg swelling.  Gastrointestinal: Negative for nausea, diarrhea, abdominal distention and anal bleeding.  Genitourinary: Negative for dysuria and difficulty urinating.  Musculoskeletal: Positive for arthralgias. Negative for back pain, joint swelling and neck pain.  Skin: Negative.  Negative for rash.  Allergic/Immunologic: Negative.   Neurological: Negative for dizziness, weakness, numbness and headaches.  Hematological: Negative.   Psychiatric/Behavioral: Negative.        Objective:   Physical Exam  Constitutional: She is oriented to person, place, and time. She appears well-developed and well-nourished. No distress.  Pleasant lady.  HENT:  Head: Normocephalic and  atraumatic.  Mouth/Throat: Oropharynx is clear and moist. No oropharyngeal exudate.  Eyes: Conjunctivae and EOM are normal. Pupils are equal, round, and reactive to light. Right eye exhibits no discharge. Left eye exhibits no discharge. No scleral icterus.  Neck: Neck supple. No thyromegaly present.  Cardiovascular: Normal rate, regular rhythm, S1 normal, S2 normal and normal heart sounds.  Exam reveals no gallop and no friction rub.   No murmur heard. Pulmonary/Chest: Effort normal and breath sounds normal. No respiratory distress. She has no wheezes. She has no rales. She exhibits no tenderness.  Abdominal: Soft. Bowel sounds are normal. She exhibits no distension and no mass. There is no tenderness. There is no rebound and no guarding.  Musculoskeletal: Normal range of motion.  Bony prominence noted on both knees. Some tenderness with movement. Has good ROM of both knees.   Lymphadenopathy:    She has no cervical adenopathy.  Neurological: She is alert and oriented to person, place, and time. She has normal strength and normal reflexes. No cranial nerve deficit or sensory deficit.  Skin: No rash noted. She is not diaphoretic. No erythema. No pallor.  Psychiatric: She has a normal mood and affect.     Filed Vitals:   05/12/15 1442  BP: 166/69  Pulse: 72  Temp: 97.7 F (36.5 C)        Assessment & Plan:  See problem based a&p.

## 2015-05-13 LAB — BASIC METABOLIC PANEL
BUN / CREAT RATIO: 18 (ref 11–26)
BUN: 23 mg/dL (ref 8–27)
CO2: 26 mmol/L (ref 18–29)
CREATININE: 1.27 mg/dL — AB (ref 0.57–1.00)
Calcium: 9.3 mg/dL (ref 8.7–10.3)
Chloride: 102 mmol/L (ref 97–106)
GFR, EST AFRICAN AMERICAN: 52 mL/min/{1.73_m2} — AB (ref >59)
GFR, EST NON AFRICAN AMERICAN: 45 mL/min/{1.73_m2} — AB (ref >59)
Glucose: 173 mg/dL — ABNORMAL HIGH (ref 65–99)
Potassium: 4.7 mmol/L (ref 3.5–5.2)
Sodium: 143 mmol/L (ref 136–144)

## 2015-05-14 NOTE — Assessment & Plan Note (Signed)
Remains uncontrolled. Last hgba1c was 9.4 (increased).  She is not measuring her blood sugar or taking her glipizide regularly. We had long discussion about prioritizing her health. Offered her help to reduce her stress. She stated she will start eating healthy diet, check her sugar, and also take her medications.  - Asked to continue Glipizide 5 mg BID.  - check sugar at least 3 times a day and to bring her meter with her.

## 2015-05-14 NOTE — Assessment & Plan Note (Signed)
Lab Results  Component Value Date   CREATININE 1.27* 05/12/2015   BUN 23 05/12/2015   NA 143 05/12/2015   K 4.7 05/12/2015   CL 102 05/12/2015   CO2 26 05/12/2015   crt is stable at baseline,

## 2015-05-14 NOTE — Assessment & Plan Note (Signed)
Last visit her BP med was changed from HCTZ 25mg  to 12.5mg  because of low BP in 93/73.  Today her BP was 166/69. She mentioned she has not been taking her meds regularly and has been going through lot of stress which she thinks is impacting her BP.  we will continue the 12.5mg  HCTZ daily for now and recheck her BP next visit.

## 2015-05-14 NOTE — Assessment & Plan Note (Signed)
She does not want PT, brace, steroid injection, or surgery at this point. I explained that osteoarthritis is not reversible and things that can help her pain includes exercise. Given the severity of there osteoarthritis, I recommended getting surgery while she is still relatively healthy rather than waiting until more health complications potentially arise when she is older.  Patient will think about surgery but not ready for it as of now. Asked her to take tylenol for pain.

## 2015-05-20 ENCOUNTER — Encounter: Payer: Self-pay | Admitting: *Deleted

## 2015-05-20 NOTE — Progress Notes (Signed)
Internal Medicine Clinic Attending  Case discussed with Dr. Ahmed at the time of the visit.  We reviewed the resident's history and exam and pertinent patient test results.  I agree with the assessment, diagnosis, and plan of care documented in the resident's note. 

## 2015-06-20 ENCOUNTER — Encounter: Payer: Self-pay | Admitting: *Deleted

## 2015-07-14 ENCOUNTER — Encounter: Payer: 59 | Admitting: Internal Medicine

## 2015-07-21 ENCOUNTER — Encounter: Payer: 59 | Admitting: Internal Medicine

## 2015-07-28 ENCOUNTER — Encounter: Payer: Self-pay | Admitting: Internal Medicine

## 2015-07-28 ENCOUNTER — Ambulatory Visit (HOSPITAL_COMMUNITY)
Admission: RE | Admit: 2015-07-28 | Discharge: 2015-07-28 | Disposition: A | Discharge status: Home / Self Care | Payer: BLUE CROSS/BLUE SHIELD | Source: Ambulatory Visit | Attending: Student in an Organized Health Care Education/Training Program | Admitting: Student in an Organized Health Care Education/Training Program

## 2015-07-28 ENCOUNTER — Ambulatory Visit (INDEPENDENT_AMBULATORY_CARE_PROVIDER_SITE_OTHER): Payer: BLUE CROSS/BLUE SHIELD | Admitting: Internal Medicine

## 2015-07-28 VITALS — BP 160/83 | HR 70 | Temp 98.3°F | Ht 64.0 in | Wt 240.0 lb

## 2015-07-28 DIAGNOSIS — E11319 Type 2 diabetes mellitus with unspecified diabetic retinopathy without macular edema: Secondary | ICD-10-CM

## 2015-07-28 DIAGNOSIS — E1165 Type 2 diabetes mellitus with hyperglycemia: Secondary | ICD-10-CM | POA: Diagnosis not present

## 2015-07-28 DIAGNOSIS — Z9114 Patient's other noncompliance with medication regimen: Secondary | ICD-10-CM | POA: Diagnosis not present

## 2015-07-28 DIAGNOSIS — M25562 Pain in left knee: Secondary | ICD-10-CM | POA: Diagnosis not present

## 2015-07-28 DIAGNOSIS — E113299 Type 2 diabetes mellitus with mild nonproliferative diabetic retinopathy without macular edema, unspecified eye: Secondary | ICD-10-CM

## 2015-07-28 DIAGNOSIS — M1712 Unilateral primary osteoarthritis, left knee: Secondary | ICD-10-CM | POA: Insufficient documentation

## 2015-07-28 DIAGNOSIS — I10 Essential (primary) hypertension: Secondary | ICD-10-CM

## 2015-07-28 LAB — POCT GLYCOSYLATED HEMOGLOBIN (HGB A1C): Hemoglobin A1C: 8

## 2015-07-28 LAB — GLUCOSE, CAPILLARY: GLUCOSE-CAPILLARY: 193 mg/dL — AB (ref 65–99)

## 2015-07-28 MED ORDER — HYDROCHLOROTHIAZIDE 25 MG PO TABS: 25.0000 mg | ORAL_TABLET | Freq: Every day | ORAL | Status: DC

## 2015-07-28 NOTE — Patient Instructions (Signed)
Make sure you take your glipizide 5mg  twice a day.  Take 25mg  HCTZ for your blood pressure.  Follow up in 3 months.  We will check xray of left knee. Take tylenol as needed for pain.

## 2015-07-28 NOTE — Progress Notes (Signed)
   Subjective:    Patient ID: Wendy [name], female    DOB: 22-Oct-1950, 65 y.o.   MRN: [id number]  HPI  65 yo female with hx of HTN, DM II, chornic knee pain, sarcoidosis of lung, here for follow up of HTN and DM II. Also wants to discuss her chronic left knee pain.  HTN: currenlty on HCTZ 12.5mg  (was decreased from 25mg  in the past due ot low BP).   DM II: she still has not been taking her 5mg  BID glipizide regularly. Sometimes she does not take it every day, sometimes she only takes it once a day. Has not brought her glucometer.   Left knee pain - chronic. Has seen sports medicine. Injections did not help in the past. She feels sometimes her knee just "gives up" when walking and she has to catch herself from falling. Has mild pain. Takes tylenol as needed for pain. No leg weakness.   Review of Systems  Constitutional: Negative for fever and chills.  HENT: Negative for congestion and sore throat.   Eyes: Negative for photophobia and visual disturbance.  Respiratory: Negative for chest tightness, shortness of breath and wheezing.   Cardiovascular: Negative for chest pain, palpitations and leg swelling.  Gastrointestinal: Negative for abdominal pain and abdominal distention.  Endocrine: Negative.   Genitourinary: Negative.   Musculoskeletal: Positive for arthralgias.  Neurological: Negative for dizziness and headaches.       Objective:   Physical Exam  Constitutional:  Overweight, pleasant female  HENT:  Head: Normocephalic and atraumatic.  Eyes: EOM are normal. Pupils are equal, round, and reactive to light.  Neck: Normal range of motion.  Cardiovascular: Normal rate and regular rhythm.  Exam reveals no gallop and no friction rub.   No murmur heard. Pulmonary/Chest: Effort normal and breath sounds normal. No respiratory distress. She has no wheezes. She has no rales. She exhibits no tenderness.  Abdominal: Soft. Bowel sounds are normal.  Musculoskeletal: Normal range of  motion.  Left knee has mild tenderness on medial aspect. No laxity noted on exam. No effusion or erythema. Has full ROM. Negative posterior and anterior drawer test. Unable to displace medially and laterally as well.    Filed Vitals:   07/28/15 1524 07/28/15 1550  BP: 163/80 160/83  Pulse: 74 70  Temp: 98.3 F (36.8 C)         Assessment & Plan:  See problem based a&p.

## 2015-07-29 NOTE — Addendum Note (Signed)
Addended by: Lalla Brothers T on: 07/29/2015 01:44 PM   Modules accepted: Level of Service

## 2015-07-29 NOTE — Assessment & Plan Note (Signed)
Filed Vitals:   07/28/15 1524 07/28/15 1550  BP: 163/80 160/83  Pulse: 74 70  Temp: 98.3 F (36.8 C)    BP remains elevated. Was on HCTZ 25mg  daily in the past and it was cut down b/c her BP was in 90's one visit. She admits to taking her medication, last dose last night.   Will change HCTZ back to 25mg  daily and recheck next visit.

## 2015-07-29 NOTE — Progress Notes (Signed)
Internal Medicine Clinic Attending  Case discussed with Dr. Ahmed at the time of the visit.  We reviewed the resident's history and exam and pertinent patient test results.  I agree with the assessment, diagnosis, and plan of care documented in the resident's note. 

## 2015-07-29 NOTE — Assessment & Plan Note (Signed)
hgba1c remains uncontrolled. However, it did decrease from 9.4 to 8.0 today. She has started taking her glipizide but does not take it every day and definitely note twice a day as we planned on doing. She tells me she will be more vigilant in taking her medications.  - cont glipizide 5mg  BID. Asked to check her BS 3 times a day and to bring her meter. Discussed lifestyle modification.

## 2015-07-29 NOTE — Assessment & Plan Note (Addendum)
Has known OA of left knee. Has seen sports medicine in the past. Did not want PT, brace, or steroid injection. She was told that this will not be reversible and she could consider total knee replacement for definitive treatment. However, she does not want any surgery at this time.  She did show concern for any acute changes given her knee has occasionally felt like "giving out" and she had to catch herself from falling. On physical exam I did not notice any abnormality. Did order repeat knee xray which did not show any changes other than "Degenerative change without acute abnormality."   Asked her to consider surgery and to let me know if she wants a referral to ortho surgery. Continue tylenol PRN for pain.

## 2015-07-30 ENCOUNTER — Telehealth: Payer: Self-pay | Admitting: Licensed Clinical Social Worker

## 2015-07-30 NOTE — Telephone Encounter (Signed)
CSW received voice mail from Ms. Nonie Hoyer requesting assistance with choosing a Medicare plan.  Pt states she has received multiple information and is unsure which plan would be best.  CSW placed called to pt.  CSW left message referring Ms. Pleitez to [employer] of [place], [place] program.  Message indicated this worker would be unable to recommend program; however, [facility] at [phone] provide counselors that offer free and unbiased counseling on Medicare health care products. Information mailed to patient.

## 2015-09-04 ENCOUNTER — Other Ambulatory Visit: Payer: Self-pay | Admitting: Pharmacist

## 2015-09-04 DIAGNOSIS — I10 Essential (primary) hypertension: Secondary | ICD-10-CM

## 2015-09-04 DIAGNOSIS — E113299 Type 2 diabetes mellitus with mild nonproliferative diabetic retinopathy without macular edema, unspecified eye: Secondary | ICD-10-CM

## 2015-09-04 MED ORDER — HYDROCHLOROTHIAZIDE 25 MG PO TABS: 25.0000 mg | ORAL_TABLET | Freq: Every day | ORAL | Status: DC

## 2015-09-04 MED ORDER — GLIPIZIDE 5 MG PO TABS
5.0000 mg | ORAL_TABLET | Freq: Two times a day (BID) | ORAL | Status: DC
Start: 2015-09-04 — End: 2016-07-26

## 2015-09-04 NOTE — Progress Notes (Signed)
5-month refill history showed patient had not picked up glipizide or HCTZ. Refilled both for patient for now and advised patient to contact me if any medication assistance needed. Patient verbalized understanding.

## 2015-09-24 ENCOUNTER — Telehealth: Payer: Self-pay | Admitting: *Deleted

## 2015-09-24 NOTE — Telephone Encounter (Signed)
Call to patient x 2 this am line busy.  Sander Nephew, RN 09/24/2015 9:24 AM.

## 2015-09-26 ENCOUNTER — Inpatient Hospital Stay (HOSPITAL_COMMUNITY)
Admission: EM | Admit: 2015-09-26 | Discharge: 2015-09-30 | DRG: 041 | Disposition: A | Discharge status: Home Health | Payer: Medicare Other | Attending: Internal Medicine | Admitting: Internal Medicine

## 2015-09-26 ENCOUNTER — Encounter (HOSPITAL_COMMUNITY): Payer: Self-pay | Admitting: Emergency Medicine

## 2015-09-26 ENCOUNTER — Emergency Department (HOSPITAL_COMMUNITY): Payer: Medicare Other

## 2015-09-26 DIAGNOSIS — E119 Type 2 diabetes mellitus without complications: Secondary | ICD-10-CM

## 2015-09-26 DIAGNOSIS — Z8673 Personal history of transient ischemic attack (TIA), and cerebral infarction without residual deficits: Secondary | ICD-10-CM | POA: Diagnosis present

## 2015-09-26 DIAGNOSIS — R4781 Slurred speech: Secondary | ICD-10-CM | POA: Diagnosis not present

## 2015-09-26 DIAGNOSIS — Z87891 Personal history of nicotine dependence: Secondary | ICD-10-CM

## 2015-09-26 DIAGNOSIS — R29703 NIHSS score 3: Secondary | ICD-10-CM | POA: Diagnosis present

## 2015-09-26 DIAGNOSIS — Z79899 Other long term (current) drug therapy: Secondary | ICD-10-CM | POA: Diagnosis not present

## 2015-09-26 DIAGNOSIS — I639 Cerebral infarction, unspecified: Secondary | ICD-10-CM | POA: Diagnosis not present

## 2015-09-26 DIAGNOSIS — Z7984 Long term (current) use of oral hypoglycemic drugs: Secondary | ICD-10-CM

## 2015-09-26 DIAGNOSIS — I130 Hypertensive heart and chronic kidney disease with heart failure and stage 1 through stage 4 chronic kidney disease, or unspecified chronic kidney disease: Secondary | ICD-10-CM | POA: Diagnosis present

## 2015-09-26 DIAGNOSIS — R4701 Aphasia: Secondary | ICD-10-CM | POA: Diagnosis present

## 2015-09-26 DIAGNOSIS — R471 Dysarthria and anarthria: Secondary | ICD-10-CM | POA: Diagnosis present

## 2015-09-26 DIAGNOSIS — I340 Nonrheumatic mitral (valve) insufficiency: Secondary | ICD-10-CM | POA: Diagnosis not present

## 2015-09-26 DIAGNOSIS — E1122 Type 2 diabetes mellitus with diabetic chronic kidney disease: Secondary | ICD-10-CM | POA: Diagnosis present

## 2015-09-26 DIAGNOSIS — E113299 Type 2 diabetes mellitus with mild nonproliferative diabetic retinopathy without macular edema, unspecified eye: Secondary | ICD-10-CM | POA: Diagnosis not present

## 2015-09-26 DIAGNOSIS — E785 Hyperlipidemia, unspecified: Secondary | ICD-10-CM | POA: Diagnosis present

## 2015-09-26 DIAGNOSIS — E1159 Type 2 diabetes mellitus with other circulatory complications: Secondary | ICD-10-CM | POA: Diagnosis present

## 2015-09-26 DIAGNOSIS — Q211 Atrial septal defect: Secondary | ICD-10-CM

## 2015-09-26 DIAGNOSIS — I5030 Unspecified diastolic (congestive) heart failure: Secondary | ICD-10-CM | POA: Diagnosis present

## 2015-09-26 DIAGNOSIS — Z8249 Family history of ischemic heart disease and other diseases of the circulatory system: Secondary | ICD-10-CM

## 2015-09-26 DIAGNOSIS — D869 Sarcoidosis, unspecified: Secondary | ICD-10-CM | POA: Diagnosis present

## 2015-09-26 DIAGNOSIS — I10 Essential (primary) hypertension: Secondary | ICD-10-CM | POA: Diagnosis not present

## 2015-09-26 DIAGNOSIS — E1165 Type 2 diabetes mellitus with hyperglycemia: Secondary | ICD-10-CM | POA: Diagnosis present

## 2015-09-26 DIAGNOSIS — E11319 Type 2 diabetes mellitus with unspecified diabetic retinopathy without macular edema: Secondary | ICD-10-CM | POA: Diagnosis present

## 2015-09-26 DIAGNOSIS — Z6839 Body mass index (BMI) 39.0-39.9, adult: Secondary | ICD-10-CM | POA: Diagnosis not present

## 2015-09-26 DIAGNOSIS — N1831 Chronic kidney disease, stage 3a: Secondary | ICD-10-CM | POA: Diagnosis present

## 2015-09-26 DIAGNOSIS — I6339 Cerebral infarction due to thrombosis of other cerebral artery: Secondary | ICD-10-CM | POA: Diagnosis not present

## 2015-09-26 DIAGNOSIS — N183 Chronic kidney disease, stage 3 unspecified: Secondary | ICD-10-CM | POA: Diagnosis present

## 2015-09-26 DIAGNOSIS — N189 Chronic kidney disease, unspecified: Secondary | ICD-10-CM

## 2015-09-26 DIAGNOSIS — I63032 Cerebral infarction due to thrombosis of left carotid artery: Secondary | ICD-10-CM | POA: Diagnosis not present

## 2015-09-26 DIAGNOSIS — I6789 Other cerebrovascular disease: Secondary | ICD-10-CM | POA: Diagnosis not present

## 2015-09-26 HISTORY — DX: Personal history of transient ischemic attack (TIA), and cerebral infarction without residual deficits: Z86.73

## 2015-09-26 LAB — COMPREHENSIVE METABOLIC PANEL
ALT: 14 U/L (ref 14–54)
AST: 19 U/L (ref 15–41)
Albumin: 3.8 g/dL (ref 3.5–5.0)
Alkaline Phosphatase: 79 U/L (ref 38–126)
Anion gap: 11 (ref 5–15)
BILIRUBIN TOTAL: 0.4 mg/dL (ref 0.3–1.2)
BUN: 34 mg/dL — AB (ref 6–20)
CALCIUM: 9.5 mg/dL (ref 8.9–10.3)
CO2: 27 mmol/L (ref 22–32)
CREATININE: 1.46 mg/dL — AB (ref 0.44–1.00)
Chloride: 103 mmol/L (ref 101–111)
GFR calc Af Amer: 42 mL/min — ABNORMAL LOW (ref >60)
GFR, EST NON AFRICAN AMERICAN: 37 mL/min — AB (ref >60)
Glucose, Bld: 154 mg/dL — ABNORMAL HIGH (ref 65–99)
POTASSIUM: 4.3 mmol/L (ref 3.5–5.1)
Sodium: 141 mmol/L (ref 135–145)
TOTAL PROTEIN: 8.1 g/dL (ref 6.5–8.1)

## 2015-09-26 LAB — DIFFERENTIAL
BASOS PCT: 1 %
Basophils Absolute: 0.1 10*3/uL (ref 0.0–0.1)
EOS PCT: 3 %
Eosinophils Absolute: 0.2 10*3/uL (ref 0.0–0.7)
LYMPHS ABS: 2.2 10*3/uL (ref 0.7–4.0)
Lymphocytes Relative: 35 %
MONO ABS: 0.4 10*3/uL (ref 0.1–1.0)
Monocytes Relative: 6 %
Neutro Abs: 3.5 10*3/uL (ref 1.7–7.7)
Neutrophils Relative %: 55 %

## 2015-09-26 LAB — ETHANOL

## 2015-09-26 LAB — CBC
HEMATOCRIT: 37.2 % (ref 36.0–46.0)
Hemoglobin: 12.4 g/dL (ref 12.0–15.0)
MCH: 26.8 pg (ref 26.0–34.0)
MCHC: 33.3 g/dL (ref 30.0–36.0)
MCV: 80.3 fL (ref 78.0–100.0)
Platelets: 233 10*3/uL (ref 150–400)
RBC: 4.63 MIL/uL (ref 3.87–5.11)
RDW: 14.5 % (ref 11.5–15.5)
WBC: 6.4 10*3/uL (ref 4.0–10.5)

## 2015-09-26 LAB — I-STAT TROPONIN, ED: TROPONIN I, POC: 0.02 ng/mL (ref 0.00–0.08)

## 2015-09-26 LAB — PROTIME-INR
INR: 0.98 (ref 0.00–1.49)
Prothrombin Time: 13.2 seconds (ref 11.6–15.2)

## 2015-09-26 LAB — CBG MONITORING, ED: GLUCOSE-CAPILLARY: 145 mg/dL — AB (ref 65–99)

## 2015-09-26 LAB — APTT: aPTT: 27 seconds (ref 24–37)

## 2015-09-26 LAB — I-STAT CHEM 8, ED
BUN: 33 mg/dL — ABNORMAL HIGH (ref 6–20)
CALCIUM ION: 1.15 mmol/L (ref 1.13–1.30)
Chloride: 103 mmol/L (ref 101–111)
Creatinine, Ser: 1.5 mg/dL — ABNORMAL HIGH (ref 0.44–1.00)
GLUCOSE: 148 mg/dL — AB (ref 65–99)
HCT: 40 % (ref 36.0–46.0)
HEMOGLOBIN: 13.6 g/dL (ref 12.0–15.0)
Potassium: 4.2 mmol/L (ref 3.5–5.1)
Sodium: 140 mmol/L (ref 135–145)
TCO2: 26 mmol/L (ref 0–100)

## 2015-09-26 MED ORDER — SENNOSIDES-DOCUSATE SODIUM 8.6-50 MG PO TABS: 1.0000 | ORAL_TABLET | Freq: Every evening | ORAL | Status: DC | PRN

## 2015-09-26 MED ORDER — ATORVASTATIN CALCIUM 40 MG PO TABS
40.0000 mg | ORAL_TABLET | Freq: Every day | ORAL | Status: DC
  Administered 2015-09-27 – 2015-09-29 (×4): 40 mg via ORAL
  Filled 2015-09-26 (×4): qty 1

## 2015-09-26 MED ORDER — STROKE: EARLY STAGES OF RECOVERY BOOK
Freq: Once | Status: AC
  Administered 2015-09-27: 07:00:00
  Filled 2015-09-26: qty 1

## 2015-09-26 MED ORDER — ASPIRIN 325 MG PO TABS
325.0000 mg | ORAL_TABLET | Freq: Every day | ORAL | Status: DC
  Administered 2015-09-26 – 2015-09-30 (×5): 325 mg via ORAL
  Filled 2015-09-26 (×5): qty 1

## 2015-09-26 MED ORDER — ENOXAPARIN SODIUM 40 MG/0.4ML ~~LOC~~ SOLN: 40.0000 mg | SUBCUTANEOUS | Status: DC

## 2015-09-26 MED ORDER — INSULIN ASPART 100 UNIT/ML ~~LOC~~ SOLN
0.0000 [IU] | Freq: Three times a day (TID) | SUBCUTANEOUS | Status: DC
Start: 2015-09-27 — End: 2015-09-30
  Administered 2015-09-27: 5 [IU] via SUBCUTANEOUS
  Administered 2015-09-27: 3 [IU] via SUBCUTANEOUS
  Administered 2015-09-27: 2 [IU] via SUBCUTANEOUS
  Administered 2015-09-28: 3 [IU] via SUBCUTANEOUS
  Administered 2015-09-28: 5 [IU] via SUBCUTANEOUS
  Administered 2015-09-28: 2 [IU] via SUBCUTANEOUS
  Administered 2015-09-29: 5 [IU] via SUBCUTANEOUS
  Administered 2015-09-29 – 2015-09-30 (×2): 2 [IU] via SUBCUTANEOUS

## 2015-09-26 NOTE — H&P (Signed)
[facility] History and Physical  GERALYNN DEMCHAK [id number] DOB: Jan 26, 1951 DOA: 09/26/2015  Referring physician: ED physician PCP: Dellia Nims, MD  Specialists: None listed  Chief Complaint:  Speech difficulty, right-sided weakness  HPI: JELESIA GODDU is a 65 y.o. female with PMH of type 2 diabetes mellitus, hypertension, chronic kidney disease stage III, and hyperlipidemia who presents to the ED with speech difficulty and right-sided weakness that began approximately 30 hours prior. Patient is accompanied by her son and husband who assist with the history. Patient had apparently been in her usual state until approximately one month ago when she experienced acute onset of speech difficulty. She describes difficulty with word finding ability at that time. These symptoms gradually improved but had not fully resolved when she noted acute worsening yesterday afternoon. She noted that her speech was slurred and she was having difficulty finding the right words to use. Her family also describes some word exchange. In addition to this, she reports weakness involving the right upper and lower extremities. Symptoms persisted this morning and may have improved slightly over the course of the day. There is been no recent fall or head injury. There is been no change in her hearing. Her vision has been complicated by retinopathy for which she is undergoing treatment currently. There is been no loss of consciousness or severe headaches. She denies chest pains or palpitations. She also denies family history of CVA, but has 2 brothers who suffered MIs. She is a former smoker who quit almost 40 years ago. She follows with her PCP for management of hypertension and diabetes. She has known hyperlipidemia but has not been treated for this. She had never been evaluated for the symptoms that began 1 month ago.  In ED, patient was found to be afebrile, saturating well on room air, and with vital signs stable. CBC  is unremarkable, INR is normal, and troponin is also within the normal limits. BMP features a serum creatinine 1.46 which appears to be slightly up from her baseline of 1.2. There is mild hyperglycemia with serum glucose of 154. EKG was obtained and features a sinus rhythm with no significant change from prior. Head CT is notable for a lacunar infarct in the left putamen which appears to be subacute. Neurology was consulted by the EDP and advised admission to [facility] for ongoing evaluation and management.  Where does patient live?   At home     Can patient participate in ADLs?  Yes        Review of Systems:   General: no fevers, chills, sweats, weight change, poor appetite, or fatigue HEENT: no hearing changes, rhinorrhea, or sore throat Pulm: no dyspnea, cough, or wheeze CV: no chest pain or palpitations Abd: no nausea, vomiting, abdominal pain, diarrhea, or constipation GU: no dysuria, hematuria, increased urinary frequency, or urgency  Ext: no leg edema Neuro: no focal numbness or tingling, no vision change or hearing loss. Right-sided weakness Skin: no rash, no wounds MSK: No muscle spasm, no deformity, no red, hot, or swollen joint Heme: No easy bruising or bleeding Travel history: No recent long distant travel    Allergy:  Allergies  Allergen Reactions  . Metformin And Related Nausea Only    "severe itching"    Past Medical History  Diagnosis Date  . Sarcoidosis ([place])   . Hypertension   . Diabetes mellitus     Past Surgical History  Procedure Laterality Date  . Intraocular lens insertion  Social History:  reports that she quit smoking about 39 years ago. She does not have any smokeless tobacco history on file. She reports that she does not drink alcohol or use illicit drugs.  Family History: History reviewed. No pertinent family history.   Prior to Admission medications   Medication Sig Start Date End Date Taking? Authorizing Provider  glipiZIDE (GLUCOTROL) 5  MG tablet Take 1 tablet (5 mg total) by mouth 2 (two) times daily before a meal. 09/04/15 09/03/16 Yes Tasrif Ahmed, MD  hydrochlorothiazide (HYDRODIURIL) 25 MG tablet Take 1 tablet (25 mg total) by mouth daily. 09/04/15  Yes Tasrif Ahmed, MD  Nutritional Supplements (NUTRITIONAL SUPPLEMENT PO) Take 1 tablet by mouth daily. High Blood Sugar.   Yes Historical Provider, MD  Disposable Gloves (LATEX EXAM GLOVES) MISC Use gloves when applying capsaicin cream 02/11/14   Norman Herrlich, MD  glucose blood test strip Check blood sugar one time a day 08/28/14   Dellia Nims, MD  Lancet Device MISC For True Track meter - use to check blood sugars once a week. Dx code: 65.00. 02/20/13   Pollie Friar, MD  Lancets (ACCU-CHEK MULTICLIX) lancets Use as instructed 01/22/13   Dixon Boos, MD    Physical Exam: Filed Vitals:   09/26/15 2154 09/26/15 2249  BP: 192/100 154/71  Pulse: 94 84  Temp: 98.7 F (37.1 C)   TempSrc: Oral   Resp: 18 16  Height: 5\' 4"  (1.626 m)   Weight: 104.327 kg (230 lb)   SpO2: 100% 99%   General: Not in acute distress HEENT:       Eyes: PERRL, EOMI, no scleral icterus or conjunctival pallor.       ENT: No discharge from the ears or nose, no pharyngeal ulcers, oral mucosa moist.        Neck: No JVD, no bruit, no appreciable mass Heme: No cervical adenopathy, no pallor Cardiac: S1/S2, RRR, No murmurs, No gallops or rubs. Pulm: Good air movement bilaterally. No rales, wheezing, rhonchi or rubs. Abd: Soft, nondistended, nontender, no rebound pain or gaurding, BS present. Ext: Trace LLE edema. 2+DP/PT pulse bilaterally. Musculoskeletal: No gross deformity, no red, hot, swollen joints   Skin: No rashes or wounds on exposed surfaces  Neuro: Alert, oriented X3, cranial nerves II-XII grossly intact, muscle strength 5/5 in all extremities with subtle decrease in right grip strength relative to left; sensation to light touch intact. Brachial reflex 2+ bilaterally. Knee reflex 2+  bilaterally. Negative Babinski's sign. Mild dysarthria and mild expressive aphasia.  Psych: Patient is not overtly psychotic, appropriate mood and affect.  Labs on Admission:  Basic Metabolic Panel:  Recent Labs Lab 09/26/15 2159 09/26/15 2216  NA 141 140  K 4.3 4.2  CL 103 103  CO2 27  --   GLUCOSE 154* 148*  BUN 34* 33*  CREATININE 1.46* 1.50*  CALCIUM 9.5  --    Liver Function Tests:  Recent Labs Lab 09/26/15 2159  AST 19  ALT 14  ALKPHOS 79  BILITOT 0.4  PROT 8.1  ALBUMIN 3.8   No results for input(s): LIPASE, AMYLASE in the last 168 hours. No results for input(s): AMMONIA in the last 168 hours. CBC:  Recent Labs Lab 09/26/15 2159 09/26/15 2216  WBC 6.4  --   NEUTROABS 3.5  --   HGB 12.4 13.6  HCT 37.2 40.0  MCV 80.3  --   PLT 233  --    Cardiac Enzymes: No results for input(s): CKTOTAL, CKMB,  CKMBINDEX, TROPONINI in the last 168 hours.  BNP (last 3 results) No results for input(s): BNP in the last 8760 hours.  ProBNP (last 3 results) No results for input(s): PROBNP in the last 8760 hours.  CBG:  Recent Labs Lab 09/26/15 2153  GLUCAP 145*    Radiological Exams on Admission: Ct Head Wo Contrast  09/26/2015  CLINICAL DATA:  Slurred speech and confusion. Onset yesterday afternoon. EXAM: CT HEAD WITHOUT CONTRAST TECHNIQUE: Contiguous axial images were obtained from the base of the skull through the vertex without intravenous contrast. COMPARISON:  None. FINDINGS: There is no intracranial hemorrhage. There is no extra-axial fluid collection. There is lacunar infarction in the left putamen, probably subacute. Slight generalized atrophy. Ventricles and basal cisterns are unremarkable. No significant bony abnormality. Visible paranasal sinuses are clear. Orbits are unremarkable. IMPRESSION: Lacunar infarction in the left putamen, probably subacute. No hemorrhage. Electronically Signed   By: Andreas Newport M.D.   On: 09/26/2015 22:49    EKG:  Independently reviewed.  Abnormal findings: Sinus rhythm, not significantly changed from prior   Assessment/Plan  1. Subacute CVA  - Presents to [facility] ED with mild dysarthria and expressive aphasia, subtle right grip weakness  - Non-contrast head CT with apparent lacunar infarct of left putamen, appears subacute  - Neurology is consulting and much appreciated, advising admission to [facility]  - Outside tPA window  - Admit to telemetry at [facility] until swallow eval; RN will then order appropriate diet  - Glycemic-control as below  - ASA 325 for secondary ppx; high-intensity statin started  - Further eval with carotid dopplers, TTE, MRI/MRA  - Check A1c and fasting lipids in am  - PT, OT, SLP evals  2. Type II DM with retinopathy  - Managed with glipizide at home  - A1c 8.0% in February 2017, reflecting suboptimal control, but improved from priors  - Hold glipizide while admitted  - Check CBG with meals and qHS  - Moderate-intensity SSI correctional initiated, adjust prn  - Carb-modified diet when appropriate    3. Hypertension - Slightly above goal currently  - Managed with HCTZ at home  - Hold HCTZ and monitor for now    4. Hyperlipidemia  - LDL 163 and HDL 59 in April 2016  - High-intensity statin started  - Fasting lipid panel in am   5. CKD stage III  - SCr is 1.46 on admission, slightly up from apparent baseline of ~1.2  - Avoid nephrotoxins as possible  - Gentle IVF hydration overnight with NS at 75 cc/hr   - Monitor    DVT ppx:  SQ Lovenox      Code Status: Full code Family Communication:  Yes, patient's husband and son at bed side Disposition Plan: Admit to inpatient   Date of Service 09/26/2015    Vianne Bulls, MD [facility] Pager [phone]  If 7PM-7AM, please contact night-coverage [email or web address] Password [facility] 09/26/2015, 11:49 PM

## 2015-09-26 NOTE — ED Notes (Signed)
Pt presents with slurred speech, confusion and R sided weakness onset yesterday afternoon. Pt states she had a similar episode about 1 month ago

## 2015-09-26 NOTE — ED Provider Notes (Signed)
CSN: [id number]     Arrival date & time 09/26/15  2146 History   First MD Initiated Contact with Patient 09/26/15 2152     Chief Complaint  Patient presents with  . Cerebrovascular Accident     (Consider location/radiation/quality/duration/timing/severity/associated sxs/prior Treatment) Patient is a 65 y.o. female presenting with Acute Neurological Problem. The history is provided by the patient.  Cerebrovascular Accident This is a new problem. The current episode started yesterday. The problem occurs constantly. The problem has not changed since onset.Associated symptoms include headaches. Associated symptoms comments: Slurred speech and some difficulty getting the words out.  Also feeling slight right sided weakness. Nothing aggravates the symptoms. Nothing relieves the symptoms. She has tried nothing for the symptoms. The treatment provided no relief.    Past Medical History  Diagnosis Date  . Sarcoidosis ([place])   . Hypertension   . Diabetes mellitus    Past Surgical History  Procedure Laterality Date  . Intraocular lens insertion     No family history on file. Social History  Substance Use Topics  . Smoking status: Former Smoker    Quit date: 08/09/1976  . Smokeless tobacco: None  . Alcohol Use: No   OB History    Gravida Para Term Preterm AB TAB SAB Ectopic Multiple Living   4 4        4      Review of Systems  Neurological: Positive for headaches.  All other systems reviewed and are negative.     Allergies  Metformin and related  Home Medications   Prior to Admission medications   Medication Sig Start Date End Date Taking? Authorizing Provider  Disposable Gloves (LATEX EXAM GLOVES) MISC Use gloves when applying capsaicin cream 02/11/14   Norman Herrlich, MD  glipiZIDE (GLUCOTROL) 5 MG tablet Take 1 tablet (5 mg total) by mouth 2 (two) times daily before a meal. 09/04/15 09/03/16  Tasrif Ahmed, MD  glucose blood test strip Check blood sugar one time a day 08/28/14    Dellia Nims, MD  hydrochlorothiazide (HYDRODIURIL) 25 MG tablet Take 1 tablet (25 mg total) by mouth daily. 09/04/15   Dellia Nims, MD  Lancet Device MISC For True Track meter - use to check blood sugars once a week. Dx code: 250.00. 02/20/13   Pollie Friar, MD  Lancets (ACCU-CHEK MULTICLIX) lancets Use as instructed 01/22/13   Dixon Boos, MD   BP 192/100 mmHg  Pulse 94  Temp(Src) 98.7 F (37.1 C) (Oral)  Resp 18  Ht 5\' 4"  (1.626 m)  Wt 230 lb (104.327 kg)  BMI 39.46 kg/m2  SpO2 100% Physical Exam  Constitutional: She is oriented to person, place, and time. She appears well-developed and well-nourished. No distress.  HENT:  Head: Normocephalic and atraumatic.  Mouth/Throat: Oropharynx is clear and moist.  Eyes: Conjunctivae and EOM are normal. Pupils are equal, round, and reactive to light.  Neck: Normal range of motion. Neck supple.  Cardiovascular: Normal rate, regular rhythm and intact distal pulses.   No murmur heard. Pulmonary/Chest: Effort normal and breath sounds normal. No respiratory distress. She has no wheezes. She has no rales.  Abdominal: Soft. She exhibits no distension. There is no tenderness. There is no rebound and no guarding.  Musculoskeletal: Normal range of motion. She exhibits no edema or tenderness.  Neurological: She is alert and oriented to person, place, and time. She has normal strength. No sensory deficit.  No pronator drift.  Mild aphasia and slurred speech  Skin: Skin is  warm and dry. No rash noted. No erythema.  Psychiatric: She has a normal mood and affect. Her behavior is normal.  Nursing note and vitals reviewed.   ED Course  Procedures (including critical care time) Labs Review Labs Reviewed  COMPREHENSIVE METABOLIC PANEL - Abnormal; Notable for the following:    Glucose, Bld 154 (*)    BUN 34 (*)    Creatinine, Ser 1.46 (*)    GFR calc non Af Amer 37 (*)    GFR calc Af Amer 42 (*)    All other components within normal limits  CBG  MONITORING, ED - Abnormal; Notable for the following:    Glucose-Capillary 145 (*)    All other components within normal limits  I-STAT CHEM 8, ED - Abnormal; Notable for the following:    BUN 33 (*)    Creatinine, Ser 1.50 (*)    Glucose, Bld 148 (*)    All other components within normal limits  ETHANOL  PROTIME-INR  APTT  CBC  DIFFERENTIAL  URINE RAPID DRUG SCREEN, HOSP PERFORMED  URINALYSIS, ROUTINE W REFLEX MICROSCOPIC (NOT AT [facility])  I-STAT TROPOININ, ED    Imaging Review Ct Head Wo Contrast  09/26/2015  CLINICAL DATA:  Slurred speech and confusion. Onset yesterday afternoon. EXAM: CT HEAD WITHOUT CONTRAST TECHNIQUE: Contiguous axial images were obtained from the base of the skull through the vertex without intravenous contrast. COMPARISON:  None. FINDINGS: There is no intracranial hemorrhage. There is no extra-axial fluid collection. There is lacunar infarction in the left putamen, probably subacute. Slight generalized atrophy. Ventricles and basal cisterns are unremarkable. No significant bony abnormality. Visible paranasal sinuses are clear. Orbits are unremarkable. IMPRESSION: Lacunar infarction in the left putamen, probably subacute. No hemorrhage. Electronically Signed   By: Andreas Newport M.D.   On: 09/26/2015 22:49   I have personally reviewed and evaluated these images and lab results as part of my medical decision-making.   EKG Interpretation   Date/Time:  Friday September 26 2015 22:17:00 EDT Ventricular Rate:  82 PR Interval:  162 QRS Duration: 88 QT Interval:  384 QTC Calculation: 448 R Axis:   26 Text Interpretation:  Sinus rhythm Borderline T abnormalities, inferior  leads Baseline wander in lead(s) V1 No significant change since last  tracing Confirmed by Maryan Rued  MD, Loree Fee ([id number]) on 09/26/2015 10:22:51  PM      MDM   Final diagnoses:  None    Patient is a 65 year old female with a history of hypertension and diabetes presenting today with slurred  speech, aphasia and a complaint of subjective right-sided weakness. Patient states she had similar symptoms approximately one month ago that resolved spontaneously but they started last night and have not resolved. Concern for stroke. Patient has mild aphasia and slurred speech on exam but no appreciable weakness. She has no pronator drift or cerebellar symptoms.  Stroke order set initiated. Patient is hypertensive here 192/100. She did take her medication today.  11:08 PM Patient's labs without acute findings. CT showing a subacute stroke. Spoke with Dr. Nicole Kindred who recommended the patient be sent to [facility]. Will admit for further care.   Blanchie Dessert, MD 09/26/15 2308

## 2015-09-27 ENCOUNTER — Inpatient Hospital Stay (HOSPITAL_COMMUNITY): Payer: Medicare Other

## 2015-09-27 ENCOUNTER — Inpatient Hospital Stay (HOSPITAL_COMMUNITY): Payer: BLUE CROSS/BLUE SHIELD

## 2015-09-27 DIAGNOSIS — E785 Hyperlipidemia, unspecified: Secondary | ICD-10-CM

## 2015-09-27 DIAGNOSIS — I6789 Other cerebrovascular disease: Secondary | ICD-10-CM

## 2015-09-27 DIAGNOSIS — I6339 Cerebral infarction due to thrombosis of other cerebral artery: Secondary | ICD-10-CM

## 2015-09-27 DIAGNOSIS — I639 Cerebral infarction, unspecified: Principal | ICD-10-CM

## 2015-09-27 DIAGNOSIS — N183 Chronic kidney disease, stage 3 (moderate): Secondary | ICD-10-CM

## 2015-09-27 LAB — GLUCOSE, CAPILLARY
GLUCOSE-CAPILLARY: 121 mg/dL — AB (ref 65–99)
Glucose-Capillary: 134 mg/dL — ABNORMAL HIGH (ref 65–99)
Glucose-Capillary: 203 mg/dL — ABNORMAL HIGH (ref 65–99)
Glucose-Capillary: 158 mg/dL — ABNORMAL HIGH (ref 65–99)

## 2015-09-27 LAB — RAPID URINE DRUG SCREEN, HOSP PERFORMED
Amphetamines: NOT DETECTED
Barbiturates: NOT DETECTED
Benzodiazepines: NOT DETECTED
Cocaine: NOT DETECTED
OPIATES: NOT DETECTED
Tetrahydrocannabinol: NOT DETECTED

## 2015-09-27 LAB — URINALYSIS, ROUTINE W REFLEX MICROSCOPIC
Bilirubin Urine: NEGATIVE
Glucose, UA: NEGATIVE mg/dL
Hgb urine dipstick: NEGATIVE
Ketones, ur: NEGATIVE mg/dL
LEUKOCYTES UA: NEGATIVE
NITRITE: NEGATIVE
PH: 7 (ref 5.0–8.0)
Protein, ur: 30 mg/dL — AB
SPECIFIC GRAVITY, URINE: 1.015 (ref 1.005–1.030)

## 2015-09-27 LAB — URINE MICROSCOPIC-ADD ON: RBC / HPF: NONE SEEN RBC/hpf (ref 0–5)

## 2015-09-27 LAB — ECHOCARDIOGRAM COMPLETE
HEIGHTINCHES: 64 in
Weight: 3680 oz

## 2015-09-27 MED ORDER — SODIUM CHLORIDE 0.9 % IV SOLN
INTRAVENOUS | Status: DC
  Administered 2015-09-27: 01:00:00 via INTRAVENOUS

## 2015-09-27 MED ORDER — ENOXAPARIN SODIUM 40 MG/0.4ML ~~LOC~~ SOLN
40.0000 mg | SUBCUTANEOUS | Status: DC
  Administered 2015-09-27 – 2015-09-29 (×3): 40 mg via SUBCUTANEOUS
  Filled 2015-09-27 (×3): qty 0.4

## 2015-09-27 NOTE — Consult Note (Signed)
Admission H&P    Chief Complaint: New onset speech abnormality and right-sided weakness.  HPI: Wendy [name] is an 65 y.o. female history diabetes mellitus, hypertension, lipidemia who presented following onset of speech difficulty and right-sided weakness on the previous afternoon. She was last known well at 4 PM on 09/25/2015. She had a similar change in speech about one month earlier which apparently resolved, and for which she did not seek medical attention. CT scan of her head showed lacunar infarction in the left putamen thought to be subacute. MRI is pending. NIH stroke score was 3. She has not been on antiplatelet therapy and has been started on aspirin.  LSN: 4:00 PM on 09/25/2015 tPA Given: No: Beyond time window for treatment consideration. mRankin:  Past Medical History  Diagnosis Date  . Sarcoidosis ([place])   . Hypertension   . Diabetes mellitus     Past Surgical History  Procedure Laterality Date  . Intraocular lens insertion      History reviewed. No pertinent family history. Social History:  reports that she quit smoking about 39 years ago. She does not have any smokeless tobacco history on file. She reports that she does not drink alcohol or use illicit drugs.  Allergies:  Allergies  Allergen Reactions  . Metformin And Related Nausea Only    "severe itching"    Medications Prior to Admission  Medication Sig Dispense Refill  . glipiZIDE (GLUCOTROL) 5 MG tablet Take 1 tablet (5 mg total) by mouth 2 (two) times daily before a meal. 180 tablet 1  . hydrochlorothiazide (HYDRODIURIL) 25 MG tablet Take 1 tablet (25 mg total) by mouth daily. 30 tablet 3  . Nutritional Supplements (NUTRITIONAL SUPPLEMENT PO) Take 1 tablet by mouth daily. High Blood Sugar.    . Disposable Gloves (LATEX EXAM GLOVES) MISC Use gloves when applying capsaicin cream 100 each 0  . glucose blood test strip Check blood sugar one time a day 50 each 12  . Lancet Device MISC For True Track meter -  use to check blood sugars once a week. Dx code: 250.00. 100 each 11  . Lancets (ACCU-CHEK MULTICLIX) lancets Use as instructed 100 each 12    ROS: History obtained from the patient  General ROS: negative for - chills, fatigue, fever, night sweats, weight gain or weight loss Psychological ROS: negative for - behavioral disorder, hallucinations, memory difficulties, mood swings or suicidal ideation Ophthalmic ROS: negative for - blurry vision, double vision, eye pain or loss of vision ENT ROS: negative for - epistaxis, nasal discharge, oral lesions, sore throat, tinnitus or vertigo Allergy and Immunology ROS: negative for - hives or itchy/watery eyes Hematological and Lymphatic ROS: negative for - bleeding problems, bruising or swollen lymph nodes Endocrine ROS: negative for - galactorrhea, hair pattern changes, polydipsia/polyuria or temperature intolerance Respiratory ROS: negative for - cough, hemoptysis, shortness of breath or wheezing Cardiovascular ROS: negative for - chest pain, dyspnea on exertion, edema or irregular heartbeat Gastrointestinal ROS: negative for - abdominal pain, diarrhea, hematemesis, nausea/vomiting or stool incontinence Genito-Urinary ROS: negative for - dysuria, hematuria, incontinence or urinary frequency/urgency Musculoskeletal ROS: negative for - joint swelling or muscular weakness Neurological ROS: as noted in HPI Dermatological ROS: negative for rash and skin lesion changes  Physical Examination: Blood pressure 181/77, pulse 78, temperature 98.7 F (37.1 C), temperature source Oral, resp. rate 20, height 5' 4"  (1.626 m), weight 104.327 kg (230 lb), SpO2 100 %.  HEENT-  Normocephalic, no lesions, without obvious abnormality.  Normal external  eye and conjunctiva.  Normal TM's bilaterally.  Normal auditory canals and external ears. Normal external nose, mucus membranes and septum.  Normal pharynx. Neck supple with no masses, nodes, nodules or  enlargement. Cardiovascular - regular rate and rhythm, S1, S2 normal, no murmur, click, rub or gallop Lungs - chest clear, no wheezing, rales, normal symmetric air entry Abdomen - soft, non-tender; bowel sounds normal; no masses,  no organomegaly Extremities - no joint deformities, effusion, or inflammation and no edema  Neurologic Examination: Mental Status: Alert, oriented, thought content appropriate.  Speech fluent without evidence of aphasia. Able to follow commands without difficulty. Cranial Nerves: II-Visual fields were normal. III/IV/VI-Pupils were equal and reacted normally to light. Extraocular movements were full and conjugate.    V/VII-no facial numbness; slight right lower facial weakness. VIII-normal. X-normal speech and symmetrical palatal movement. XI: trapezius strength/neck flexion strength normal bilaterally XII-midline tongue extension with normal strength. Motor: Mild drift of right upper and lower extremities; motor exam otherwise unremarkable Sensory: Normal throughout. Deep Tendon Reflexes: 1+ and symmetric in upper extremities and absent in lower extremities. Plantars: Mute bilaterally Cerebellar: Normal finger-to-nose testing. Carotid auscultation: Normal  Results for orders placed or performed during the hospital encounter of 09/26/15 (from the past 48 hour(s))  CBG monitoring, ED     Status: Abnormal   Collection Time: 09/26/15  9:53 PM  Result Value Ref Range   Glucose-Capillary 145 (H) 65 - 99 mg/dL  Ethanol     Status: None   Collection Time: 09/26/15  9:59 PM  Result Value Ref Range   Alcohol, Ethyl (B) <5 <5 mg/dL    Comment:        LOWEST DETECTABLE LIMIT FOR SERUM ALCOHOL IS 5 mg/dL FOR MEDICAL PURPOSES ONLY   Protime-INR     Status: None   Collection Time: 09/26/15  9:59 PM  Result Value Ref Range   Prothrombin Time 13.2 11.6 - 15.2 seconds   INR 0.98 0.00 - 1.49  APTT     Status: None   Collection Time: 09/26/15  9:59 PM  Result Value  Ref Range   aPTT 27 24 - 37 seconds  CBC     Status: None   Collection Time: 09/26/15  9:59 PM  Result Value Ref Range   WBC 6.4 4.0 - 10.5 K/uL   RBC 4.63 3.87 - 5.11 MIL/uL   Hemoglobin 12.4 12.0 - 15.0 g/dL   HCT 37.2 36.0 - 46.0 %   MCV 80.3 78.0 - 100.0 fL   MCH 26.8 26.0 - 34.0 pg   MCHC 33.3 30.0 - 36.0 g/dL   RDW 14.5 11.5 - 15.5 %   Platelets 233 150 - 400 K/uL  Differential     Status: None   Collection Time: 09/26/15  9:59 PM  Result Value Ref Range   Neutrophils Relative % 55 %   Lymphocytes Relative 35 %   Monocytes Relative 6 %   Eosinophils Relative 3 %   Basophils Relative 1 %   Neutro Abs 3.5 1.7 - 7.7 K/uL   Lymphs Abs 2.2 0.7 - 4.0 K/uL   Monocytes Absolute 0.4 0.1 - 1.0 K/uL   Eosinophils Absolute 0.2 0.0 - 0.7 K/uL   Basophils Absolute 0.1 0.0 - 0.1 K/uL   WBC Morphology WHITE COUNT CONFIRMED ON SMEAR   Comprehensive metabolic panel     Status: Abnormal   Collection Time: 09/26/15  9:59 PM  Result Value Ref Range   Sodium 141 135 - 145 mmol/L  Potassium 4.3 3.5 - 5.1 mmol/L   Chloride 103 101 - 111 mmol/L   CO2 27 22 - 32 mmol/L   Glucose, Bld 154 (H) 65 - 99 mg/dL   BUN 34 (H) 6 - 20 mg/dL   Creatinine, Ser 1.46 (H) 0.44 - 1.00 mg/dL   Calcium 9.5 8.9 - 10.3 mg/dL   Total Protein 8.1 6.5 - 8.1 g/dL   Albumin 3.8 3.5 - 5.0 g/dL   AST 19 15 - 41 U/L   ALT 14 14 - 54 U/L   Alkaline Phosphatase 79 38 - 126 U/L   Total Bilirubin 0.4 0.3 - 1.2 mg/dL   GFR calc non Af Amer 37 (L) >60 mL/min   GFR calc Af Amer 42 (L) >60 mL/min    Comment: (NOTE) The eGFR has been calculated using the CKD EPI equation. This calculation has not been validated in all clinical situations. eGFR's persistently <60 mL/min signify possible Chronic Kidney Disease.    Anion gap 11 5 - 15  I-stat troponin, ED (not at [facility], [facility])     Status: None   Collection Time: 09/26/15 10:14 PM  Result Value Ref Range   Troponin i, poc 0.02 0.00 - 0.08 ng/mL   Comment 3             Comment: Due to the release kinetics of cTnI, a negative result within the first hours of the onset of symptoms does not rule out myocardial infarction with certainty. If myocardial infarction is still suspected, repeat the test at appropriate intervals.   I-Stat Chem 8, ED  (not at [facility], [facility])     Status: Abnormal   Collection Time: 09/26/15 10:16 PM  Result Value Ref Range   Sodium 140 135 - 145 mmol/L   Potassium 4.2 3.5 - 5.1 mmol/L   Chloride 103 101 - 111 mmol/L   BUN 33 (H) 6 - 20 mg/dL   Creatinine, Ser 1.50 (H) 0.44 - 1.00 mg/dL   Glucose, Bld 148 (H) 65 - 99 mg/dL   Calcium, Ion 1.15 1.13 - 1.30 mmol/L   TCO2 26 0 - 100 mmol/L   Hemoglobin 13.6 12.0 - 15.0 g/dL   HCT 40.0 36.0 - 46.0 %  Urine rapid drug screen (hosp performed)not at [facility]     Status: None   Collection Time: 09/26/15 11:53 PM  Result Value Ref Range   Opiates NONE DETECTED NONE DETECTED   Cocaine NONE DETECTED NONE DETECTED   Benzodiazepines NONE DETECTED NONE DETECTED   Amphetamines NONE DETECTED NONE DETECTED   Tetrahydrocannabinol NONE DETECTED NONE DETECTED   Barbiturates NONE DETECTED NONE DETECTED    Comment:        DRUG SCREEN FOR MEDICAL PURPOSES ONLY.  IF CONFIRMATION IS NEEDED FOR ANY PURPOSE, NOTIFY LAB WITHIN 5 DAYS.        LOWEST DETECTABLE LIMITS FOR URINE DRUG SCREEN Drug Class       Cutoff (ng/mL) Amphetamine      1000 Barbiturate      200 Benzodiazepine   263 Tricyclics       335 Opiates          300 Cocaine          300 THC              50   Urinalysis, Routine w reflex microscopic (not at [facility])     Status: Abnormal   Collection Time: 09/26/15 11:53 PM  Result Value Ref Range   Color, Urine YELLOW  YELLOW   APPearance CLEAR CLEAR   Specific Gravity, Urine 1.015 1.005 - 1.030   pH 7.0 5.0 - 8.0   Glucose, UA NEGATIVE NEGATIVE mg/dL   Hgb urine dipstick NEGATIVE NEGATIVE   Bilirubin Urine NEGATIVE NEGATIVE   Ketones, ur NEGATIVE NEGATIVE mg/dL   Protein, ur 30 (A)  NEGATIVE mg/dL   Nitrite NEGATIVE NEGATIVE   Leukocytes, UA NEGATIVE NEGATIVE  Urine microscopic-add on     Status: Abnormal   Collection Time: 09/26/15 11:53 PM  Result Value Ref Range   Squamous Epithelial / LPF 0-5 (A) NONE SEEN   WBC, UA 0-5 0 - 5 WBC/hpf   RBC / HPF NONE SEEN 0 - 5 RBC/hpf   Bacteria, UA RARE (A) NONE SEEN   Casts HYALINE CASTS (A) NEGATIVE   Dg Chest 2 View  09/27/2015  CLINICAL DATA:  Slurred speech, confusion and right-sided weakness. Onset yesterday afternoon. EXAM: CHEST  2 VIEW COMPARISON:  04/09/2013 FINDINGS: There is mild chronic appearing interstitial coarsening it. This may be related to the described history of sarcoidosis. No alveolar opacities. No effusions. Normal hilar, mediastinal and cardiac contours. IMPRESSION: Mild chronic appearing interstitial coarsening. No acute cardiopulmonary findings. Electronically Signed   By: Andreas Newport M.D.   On: 09/27/2015 00:35   Ct Head Wo Contrast  09/26/2015  CLINICAL DATA:  Slurred speech and confusion. Onset yesterday afternoon. EXAM: CT HEAD WITHOUT CONTRAST TECHNIQUE: Contiguous axial images were obtained from the base of the skull through the vertex without intravenous contrast. COMPARISON:  None. FINDINGS: There is no intracranial hemorrhage. There is no extra-axial fluid collection. There is lacunar infarction in the left putamen, probably subacute. Slight generalized atrophy. Ventricles and basal cisterns are unremarkable. No significant bony abnormality. Visible paranasal sinuses are clear. Orbits are unremarkable. IMPRESSION: Lacunar infarction in the left putamen, probably subacute. No hemorrhage. Electronically Signed   By: Andreas Newport M.D.   On: 09/26/2015 22:49    Assessment: 65 y.o. female with multiple risk factors for stroke presenting with acute left subcortical ischemic stroke.  Stroke Risk Factors - diabetes mellitus, family history, hyperlipidemia and hypertension  Plan: 1. HgbA1c,  fasting lipid panel 2. MRI, MRA  of the brain without contrast 3. PT consult, OT consult, Speech consult 4. Echocardiogram 5. Carotid dopplers 6. Prophylactic therapy-Antiplatelet med: Aspirin  7. Risk factor modification 8. Telemetry monitoring  C.R. Nicole Kindred, MD [facility] [phone]  09/27/2015, 2:19 AM

## 2015-09-27 NOTE — Evaluation (Signed)
Physical Therapy Evaluation Patient Details Name: Wendy [name] MRN: [id number] DOB: 1951/02/27 Today's Date: 09/27/2015   History of Present Illness  Pt is a 65 y/o female with a PMH significant for type 2 diabetes mellitus, hypertension, chronic kidney disease stage III, and hyperlipidemia. Pt presents to the ED with speech difficulty and R sided weakness that began approximately 30 hours prior. Per pt, she had similar symptoms about a month ago which improved but did not resolve. MRI revealed Multiple predominately subcentimeter infarcts LEFT temporal, frontal and parietal lobes and MCA territory; Minimal petechial hemorrhage LEFT parietal lobe.  Clinical Impression  Pt admitted with above diagnosis. Pt currently with functional limitations due to the deficits listed below (see PT Problem List). At the time of PT eval pt was able to perform transfers and ambulation with min assist for balance support. Pt's son was present and reports that she was doing well earlier today walking around the room without assistance, however now is needing min assist for OOB activity. Discussed the benefits of RW and BSC for assistance and energy conservation at home. Pt agreeable. Pt will benefit from skilled PT to increase their independence and safety with mobility to allow discharge to the venue listed below.       Follow Up Recommendations Home health PT;Supervision for mobility/OOB    Equipment Recommendations  Rolling walker with 5" wheels;3in1 (PT)    Recommendations for Other Services       Precautions / Restrictions Precautions Precautions: Fall Precaution Comments: Pt reports L knee, L hip, and back "trouble"/pain Restrictions Weight Bearing Restrictions: No      Mobility  Bed Mobility Overal bed mobility: Modified Independent             General bed mobility comments: Pt was able to transition into long sitting in bed and scoot around to EOB without assist. HOB flat and no rails  utilized.   Transfers Overall transfer level: Needs assistance Equipment used: Rolling walker (2 wheeled) Transfers: Sit to/from Stand Sit to Stand: Supervision         General transfer comment: Supervision for safety as pt powered-up to full standing position. Increased time for pt to gain balance prior to initiating gait training, however no assist was required.   Ambulation/Gait Ambulation/Gait assistance: Min assist Ambulation Distance (Feet): 200 Feet Assistive device: Straight cane;None Gait Pattern/deviations: Step-through pattern;Decreased stride length;Trunk flexed Gait velocity: Decreased Gait velocity interpretation: Below normal speed for age/gender General Gait Details: Frequent min assist required initially due to unsteadiness and anterior lean/sudden acceleration. With SPC, occasional min assist was required for balance, however pt also reaching for rails in the hall.   Stairs            Wheelchair Mobility    Modified Rankin (Stroke Patients Only) Modified Rankin (Stroke Patients Only) Pre-Morbid Rankin Score: No significant disability Modified Rankin: Moderately severe disability     Balance Overall balance assessment: Needs assistance Sitting-balance support: Feet supported;No upper extremity supported Sitting balance-Leahy Scale: Fair     Standing balance support: No upper extremity supported;During functional activity Standing balance-Leahy Scale: Poor Standing balance comment: Requiring min assist at this time.                              Pertinent Vitals/Pain Pain Assessment: No/denies pain    Home Living Family/patient expects to be discharged to:: Private residence Living Arrangements: Spouse/significant other Available Help at Discharge: Family;Available 24 hours/day Type  of Home: Apartment Home Access: Level entry     Home Layout: One level Home Equipment: Cane - single point      Prior Function Level of  Independence: Independent         Comments: Pt reports she rarely drives. States she needs a knee replacement so does not do a lot of walking/shopping     Hand Dominance   Dominant Hand: Right    Extremity/Trunk Assessment   Upper Extremity Assessment: Generalized weakness           Lower Extremity Assessment: Generalized weakness;LLE deficits/detail   LLE Deficits / Details: Grossly BLE's are weak, however noted increased weakness in LLE. Difficult to tell whether weakness is new onset from stroke or if it is due to chronic knee and hip pain.   Cervical / Trunk Assessment: Normal  Communication   Communication: No difficulties  Cognition Arousal/Alertness: Awake/alert Behavior During Therapy: WFL for tasks assessed/performed Overall Cognitive Status: Within Functional Limits for tasks assessed                      General Comments      Exercises        Assessment/Plan    PT Assessment Patient needs continued PT services  PT Diagnosis Difficulty walking;Generalized weakness   PT Problem List Decreased strength;Decreased range of motion;Decreased activity tolerance;Decreased balance;Decreased mobility;Decreased knowledge of use of DME;Decreased safety awareness;Decreased knowledge of precautions;Pain  PT Treatment Interventions Gait training;Stair training;DME instruction;Functional mobility training;Therapeutic activities;Therapeutic exercise;Neuromuscular re-education;Patient/family education   PT Goals (Current goals can be found in the Care Plan section) Acute Rehab PT Goals Patient Stated Goal: Get back to normal.  PT Goal Formulation: With patient/family Time For Goal Achievement: 10/04/15 Potential to Achieve Goals: Good    Frequency Min 4X/week   Barriers to discharge        Co-evaluation               End of Session Equipment Utilized During Treatment: Gait belt Activity Tolerance: Patient limited by fatigue Patient left: in  chair;with call bell/phone within reach;with family/visitor present Nurse Communication: Mobility status         Time: 1349-1415 PT Time Calculation (min) (ACUTE ONLY): 26 min   Charges:   PT Evaluation $PT Eval Moderate Complexity: 1 Procedure PT Treatments $Gait Training: 8-22 mins   PT G Codes:        Rolinda Roan 10/10/15, 2:35 PM   Rolinda Roan, PT, DPT Acute Rehabilitation Services Pager: [phone]

## 2015-09-27 NOTE — Progress Notes (Addendum)
STROKE TEAM PROGRESS NOTE   HISTORY OF PRESENT ILLNESS Wendy [name] is an 65 y.o. female history diabetes mellitus, hypertension, lipidemia who presented following onset of speech difficulty and right-sided weakness on the previous afternoon. She was last known well at 4 PM on 09/25/2015. She had a similar change in speech about one month earlier which apparently resolved, and for which she did not seek medical attention. CT scan of her head showed lacunar infarction in the left putamen thought to be subacute. MRI is pending. NIH stroke score was 3. She has not been on antiplatelet therapy and has been started on aspirin.  LSN: 4:00 PM on 09/25/2015 tPA Given: No: Beyond time window for treatment consideration. mRankin:    SUBJECTIVE (INTERVAL HISTORY)  Son at the bedside.  Discussed diagnosis and prognosis.  Discussed risk factors   OBJECTIVE Temp:  [98.4 F (36.9 C)-98.7 F (37.1 C)] 98.4 F (36.9 C) (04/15 0530) Pulse Rate:  [70-94] 74 (04/15 0530) Cardiac Rhythm:  [-] Normal sinus rhythm (04/15 0213) Resp:  [14-22] 18 (04/15 0530) BP: (154-192)/(65-100) 164/73 mmHg (04/15 0530) SpO2:  [99 %-100 %] 100 % (04/15 0530) Weight:  [104.327 kg (230 lb)] 104.327 kg (230 lb) (04/14 2154)  CBC:  Recent Labs Lab 09/26/15 2159 09/26/15 2216  WBC 6.4  --   NEUTROABS 3.5  --   HGB 12.4 13.6  HCT 37.2 40.0  MCV 80.3  --   PLT 233  --     Basic Metabolic Panel:  Recent Labs Lab 09/26/15 2159 09/26/15 2216  NA 141 140  K 4.3 4.2  CL 103 103  CO2 27  --   GLUCOSE 154* 148*  BUN 34* 33*  CREATININE 1.46* 1.50*  CALCIUM 9.5  --     Lipid Panel:    Component Value Date/Time   CHOL 240* 09/23/2014 1423   TRIG 89 09/23/2014 1423   HDL 59 09/23/2014 1423   CHOLHDL 4.1 09/23/2014 1423   VLDL 18 09/23/2014 1423   LDLCALC 163* 09/23/2014 1423   HgbA1c:  Lab Results  Component Value Date   HGBA1C 8.0 07/28/2015   Urine Drug Screen:    Component Value Date/Time    LABOPIA NONE DETECTED 09/26/2015 2353   COCAINSCRNUR NONE DETECTED 09/26/2015 2353   LABBENZ NONE DETECTED 09/26/2015 2353   AMPHETMU NONE DETECTED 09/26/2015 2353   THCU NONE DETECTED 09/26/2015 2353   LABBARB NONE DETECTED 09/26/2015 2353      IMAGING  Dg Chest 2 View 09/27/2015   Mild chronic appearing interstitial coarsening. No acute cardiopulmonary findings.     Ct Head Wo Contrast 09/26/2015   Lacunar infarction in the left putamen, probably subacute. No hemorrhage.    Mr Jodene Nam Head/brain Wo Cm 09/27/2015    MRI HEAD:  Multiple predominately subcentimeter infarcts LEFT temporal, frontal and parietal lobes and MCA territory. Minimal petechial hemorrhage LEFT parietal lobe. Moderate white matter changes compatible with chronic small vessel ischemic disease.   MRA HEAD:  LEFT distal M1 emergent large vessel occlusion. Thready flow related enhancement of LEFT M2 compatible with early collateral formation. Mild stenosis RIGHT para ophthalmic internal carotid artery.    PHYSICAL EXAM General - Well nourished, well developed, in NAD   Cardiovascular - Regular rate and rhythm Pulmonary: CTA Abdomen: NT, ND, normal bowel sounds Extremities: No C/C/E  Neurological Exam Mental Status: Normal Orientation:  Oriented to person, place and time Speech:  Fluent; no dysarthria   Cranial Nerves:  PERRL; EOMI; visual fields full,  face grossly symmetric, hearing grossly intact; shrug symmetric and tongue midline  Motor Exam:  Tone:  Within normal limits; Strength: 5/5 throughout  Sensory: Intact to light touch throughout  Coordination:  Intact finger to nose  Gait: Deferred   ASSESSMENT/PLAN Wendy [name] is a 65 y.o. female with history of diabetes mellitus, hypertension, chronic kidney disease, sarcoidosis, and hyperlipidemia presenting with speech difficulties and right hemiparesis.  She did not receive IV t-PA due to late presentation.  Strokes:  Dominant  embolic from an unknown source  Resultant    MRI  Multiple predominately subcentimeter infarcts LEFT temporal, frontal and parietal lobes and MCA territory  MRA - LEFT distal M1 emergent large vessel occlusion.  Carotid Doppler - Pending  2D Echo - EF 60-65%. No cardiac source of emboli identified.  LDL - 163  HgbA1c pending  VTE prophylaxis - Lovenox  Diet Carb Modified Fluid consistency:: Thin; Room service appropriate?: Yes  No antithrombotic prior to admission, now on aspirin 325 mg daily  Patient counseled to be compliant with her antithrombotic medications  Ongoing aggressive stroke risk factor management  Therapy recommendations: Pending  Disposition: Pending  Hypertension  Stable  Permissive hypertension (OK if < 220/120) but gradually normalize in 5-7 days  Hyperlipidemia  Home meds:  No lipid lowering medications prior to admission.  LDL 163, goal < 70  Now on Lipitor 40 mg daily   Continue statin at discharge  Diabetes  HgbA1c pending, goal < 7.0  Uncontrolled  Other Stroke Risk Factors  Advanced age  Cigarette smoker, quit smoking 40 years ago.  Obesity, Body mass index is 39.46 kg/(m^2).   Hx of TIA   Other Active Problems  Chronic kidney disease - BUN 33 creatinine 1.5  History sarcoidosis  Consider TEE and possible loop for Monday     Hospital day # 1  Mikey Bussing PA-C [facility] Pager [phone] 09/27/2015, 9:35 AM   Likely embolic strokes TEE, cardioembolic workup ZEYLIKMAN, YURIY    To contact Stroke Continuity provider, please refer to [email or web address] After hours, contact General Neurology

## 2015-09-27 NOTE — Progress Notes (Signed)
Echocardiogram 2D Echocardiogram has been performed.  Wendy [name] 09/27/2015, 9:17 AM

## 2015-09-27 NOTE — Evaluation (Signed)
Occupational Therapy Evaluation Patient Details Name: TYYANA THUESEN MRN: [id number] DOB: Sep 10, 1950 Today's Date: 09/27/2015    History of Present Illness Pt is a 65 y/o female with a PMH significant for type 2 diabetes mellitus, hypertension, chronic kidney disease stage III, and hyperlipidemia. Pt presents to the ED with speech difficulty and R sided weakness that began approximately 30 hours prior. Per pt, she had similar symptoms about a month ago which improved but did not resolve. MRI revealed Multiple predominately subcentimeter infarcts LEFT temporal, frontal and parietal lobes and MCA territory; Minimal petechial hemorrhage LEFT parietal lobe.   Clinical Impression   Pt reports she was independent with ADLs and mobility PTA. Currently pt overall min guard-min assist for ADLs and functional mobility. Initially pt required min guard but progressed to requiring min assist during functional mobility tasks during session. Pt presenting with bil UE weakness and L UE numbness/tingling impacting her independence and safety with ADLs and functional mobility. Pt planning to d/c home with 24/7 supervision from her husband. Pt would benefit from continued skilled OT to address established goals.    Follow Up Recommendations  No OT follow up;Supervision - Intermittent    Equipment Recommendations  3 in 1 bedside comode    Recommendations for Other Services       Precautions / Restrictions Precautions Precautions: Fall Precaution Comments: Pt reports L knee, L hip, and back "trouble"/pain Restrictions Weight Bearing Restrictions: No      Mobility Bed Mobility Overal bed mobility: Modified Independent             General bed mobility comments: Pt was able to transition into long sitting in bed and scoot around to EOB without assist. HOB flat and no rails utilized.   Transfers Overall transfer level: Needs assistance Equipment used: None Transfers: Sit to/from Stand Sit to  Stand: Min guard         General transfer comment: Min guard for safety.    Balance Overall balance assessment: Needs assistance Sitting-balance support: Feet supported;No upper extremity supported Sitting balance-Leahy Scale: Good     Standing balance support: No upper extremity supported;During functional activity Standing balance-Leahy Scale: Fair Standing balance comment: Requiring min assist at this time.                             ADL Overall ADL's : Needs assistance/impaired Eating/Feeding: Set up;Sitting   Grooming: Min guard;Standing   Upper Body Bathing: Supervision/ safety;Sitting   Lower Body Bathing: Min guard;Sit to/from stand   Upper Body Dressing : Supervision/safety;Sitting   Lower Body Dressing: Min guard;Sit to/from stand Lower Body Dressing Details (indicate cue type and reason): Pt able to don socks sitting EOB Toilet Transfer: Min guard;Ambulation;Regular Toilet;Grab bars   Toileting- Clothing Manipulation and Hygiene: Min guard;Sit to/from stand   Tub/ Shower Transfer: Minimal assistance;Ambulation;3 in 1;Tub transfer   Functional mobility during ADLs: Min guard;Minimal assistance General ADL Comments: Pt initially required min guard assist for safety with functional mobility in room; with increased mobility and fatigue pt required min assist.     Vision Vision Assessment?: Vision impaired- to be further tested in functional context   Perception     Praxis      Pertinent Vitals/Pain Pain Assessment: No/denies pain     Hand Dominance Right   Extremity/Trunk Assessment Upper Extremity Assessment Upper Extremity Assessment: Generalized weakness (numbness/tingling in LUE; sensation intact)   Lower Extremity Assessment Lower Extremity Assessment: Defer to  PT evaluation LLE Deficits / Details: Grossly BLE's are weak, however noted increased weakness in LLE. Difficult to tell whether weakness is new onset from stroke or if it is  due to chronic knee and hip pain.    Cervical / Trunk Assessment Cervical / Trunk Assessment: Normal   Communication Communication Communication: No difficulties   Cognition Arousal/Alertness: Awake/alert Behavior During Therapy: WFL for tasks assessed/performed Overall Cognitive Status: Within Functional Limits for tasks assessed                     General Comments       Exercises       Shoulder Instructions      Home Living Family/patient expects to be discharged to:: Private residence Living Arrangements: Spouse/significant other Available Help at Discharge: Family;Available 24 hours/day Type of Home: Apartment Home Access: Level entry     Home Layout: One level     Bathroom Shower/Tub: Tub/shower unit Shower/tub characteristics: Architectural technologist: Standard Bathroom Accessibility: Yes How Accessible: Accessible via walker Home Equipment: Cane - single point          Prior Functioning/Environment Level of Independence: Independent        Comments: Pt reports she rarely drives. States she needs a knee replacement so does not do a lot of walking/shopping. Pt reports 1 fall recently when she was in the shower about 2-3 months ago. Husband has hx of multiple CVAs and she is his primary caregiver.    OT Diagnosis: Generalized weakness;Acute pain   OT Problem List: Decreased strength;Impaired balance (sitting and/or standing);Impaired vision/perception;Decreased safety awareness;Decreased knowledge of use of DME or AE;Decreased knowledge of precautions;Obesity;Pain   OT Treatment/Interventions: Self-care/ADL training;Therapeutic exercise;Energy conservation;DME and/or AE instruction;Therapeutic activities;Patient/family education;Balance training    OT Goals(Current goals can be found in the care plan section) Acute Rehab OT Goals Patient Stated Goal: Get back to normal.  OT Goal Formulation: With patient Time For Goal Achievement:  10/11/15 Potential to Achieve Goals: Good ADL Goals Pt Will Perform Upper Body Bathing: with modified independence;sitting Pt Will Perform Lower Body Bathing: with modified independence;sit to/from stand Pt Will Transfer to Toilet: with modified independence;ambulating;bedside commode (over toilet) Pt Will Perform Toileting - Clothing Manipulation and hygiene: with modified independence;sit to/from stand Pt Will Perform Tub/Shower Transfer: with modified independence;ambulating;3 in 1 Pt/caregiver will Perform Home Exercise Program: Increased strength;Both right and left upper extremity;With theraband;Independently;With written HEP provided Additional ADL Goal #1: Pt will independently verbalize 2 fall prevention strategies for increased safety with ADLs and functional mobility.  OT Frequency: Min 2X/week   Barriers to D/C:            Co-evaluation              End of Session Equipment Utilized During Treatment: Gait belt  Activity Tolerance: Patient tolerated treatment well Patient left: in bed;with call bell/phone within reach;with family/visitor present   Time: [id number] OT Time Calculation (min): 23 min Charges:  OT General Charges $OT Visit: 1 Procedure OT Evaluation $OT Eval Moderate Complexity: 1 Procedure OT Treatments $Therapeutic Activity: 8-22 mins G-Codes:     Binnie Kand M.S., OTR/L Pager: [phone]  09/27/2015, 3:49 PM

## 2015-09-27 NOTE — Progress Notes (Signed)
Patient ID: Wendy [name], female   DOB: 08-12-1950, 65 y.o.   MRN: [id number]    PROGRESS NOTE    Wendy [name]  [id number] DOB: [birth year] DOA: 09/26/2015  PCP: Dellia Nims, MD   Outpatient Specialists:   Brief Narrative:  65 y.o. female with diabetes mellitus, hypertension, lipidemia who presented for evaluation of speech difficulty and right-sided weakness on the previous afternoon. She was last known well at 4 PM on 09/25/2015. She had a similar change in speech about one month earlier which apparently resolved, and for which she did not seek medical attention.  Assessment & Plan:  1. Subacute CVA  - Multiple predominately subcentimeter infarcts LEFT temporal, frontal and parietal lobes and MCA territory.  - Minimal petechial hemorrhage LEFT parietal lobe - Neurology is consulting and much appreciated - ECHO and carotid doppler still pending  - PT evaluation done and HH PT done  - started on aspirin and will continue upon discharge   2. Type II DM with retinopathy  - Managed with glipizide at home  - A1c 8.0% in February 2017, reflecting suboptimal control, but improved from priors  - SSI ordered   3. Hypertension, essential  - reasonable inpatient control   4. Hyperlipidemia  - continue statin   5. CKD stage III  - SCr is 1.46 on admission - BMP In AM  6. Morbid obesity due to access calories  - Body mass index is 39.46 kg/(m^2).  DVT prophylaxis: Lovenox SQ Code Status: Full Family Communication: Patient at bedside  Disposition Plan: Home 4/16  Consultants:   Neurology   Procedures:   None  Antimicrobials:   None  Subjective: Reports feeling better this AM, speech better.   Objective: Filed Vitals:   09/27/15 0530 09/27/15 0940 09/27/15 1430 09/27/15 1734  BP: 164/73 155/69 168/74 135/79  Pulse: 74 71 76 72  Temp: 98.4 F (36.9 C) 98.1 F (36.7 C) 98.6 F (37 C) 98.1 F (36.7 C)  TempSrc: Oral Oral Oral Oral  Resp: 18 18 18  18   Height:      Weight:      SpO2: 100% 100% 98% 100%   No intake or output data in the 24 hours ending 09/27/15 1840 Filed Weights   09/26/15 2154  Weight: 104.327 kg (230 lb)    Examination:  General exam: Appears calm and comfortable  Respiratory system: Clear to auscultation. Respiratory effort normal. Cardiovascular system: S1 & S2 heard, RRR. No JVD, murmurs, rubs, gallops or clicks. No pedal edema. Gastrointestinal system: Abdomen is nondistended, soft and nontender.  Central nervous system: Alert and oriented. No focal neurological deficits. Extremities: Symmetric 5 x 5 power. Right hand grip 4/5, speech slow but clear  Skin: No rashes, lesions or ulcers Psychiatry: Judgement and insight appear normal. Mood & affect appropriate.   Data Reviewed: I have personally reviewed following labs and imaging studies  CBC:  Recent Labs Lab 09/26/15 2159 09/26/15 2216  WBC 6.4  --   NEUTROABS 3.5  --   HGB 12.4 13.6  HCT 37.2 40.0  MCV 80.3  --   PLT 233  --    Basic Metabolic Panel:  Recent Labs Lab 09/26/15 2159 09/26/15 2216  NA 141 140  K 4.3 4.2  CL 103 103  CO2 27  --   GLUCOSE 154* 148*  BUN 34* 33*  CREATININE 1.46* 1.50*  CALCIUM 9.5  --    Liver Function Tests:  Recent Labs Lab 09/26/15 2159  AST 19  ALT 14  ALKPHOS 79  BILITOT 0.4  PROT 8.1  ALBUMIN 3.8   Coagulation Profile:  Recent Labs Lab 09/26/15 2159  INR 0.98   CBG:  Recent Labs Lab 09/26/15 2153 09/27/15 0627 09/27/15 1110 09/27/15 1558  GLUCAP 145* 134* 203* 158*      Component Value Date/Time   COLORURINE YELLOW 09/26/2015 2353   APPEARANCEUR CLEAR 09/26/2015 2353   LABSPEC 1.015 09/26/2015 2353   PHURINE 7.0 09/26/2015 2353   GLUCOSEU NEGATIVE 09/26/2015 2353   HGBUR NEGATIVE 09/26/2015 2353   BILIRUBINUR NEGATIVE 09/26/2015 2353   BILIRUBINUR NEGATIVE 09/30/2011 [place] 09/26/2015 2353   PROTEINUR 30* 09/26/2015 2353   PROTEINUR TRACE  09/30/2011 1606   UROBILINOGEN 0.2 01/22/2013 2115   UROBILINOGEN 0.2 09/30/2011 1606   NITRITE NEGATIVE 09/26/2015 2353   NITRITE NEGATIVE 09/30/2011 1606   LEUKOCYTESUR NEGATIVE 09/26/2015 2353   Radiology Studies: Dg Chest 2 View 09/27/2015 Mild chronic appearing interstitial coarsening. No acute cardiopulmonary findings.  Ct Head Wo Contrast 09/26/2015  Lacunar infarction in the left putamen, probably subacute. No hemorrhage.   Mr Jodene Nam Head/brain Wo Cm 09/27/2015  Multiple predominately subcentimeter infarcts LEFT temporal, frontal and parietal lobes and MCA territory. Minimal petechial hemorrhage LEFT parietal lobe. Moderate white matter changes compatible with chronic small vessel ischemic disease. MRA HEAD: LEFT distal M1 emergent large vessel occlusion. Thready flow related enhancement of LEFT M2 compatible with early collateral formation. Mild stenosis RIGHT para ophthalmic internal carotid artery.   Scheduled Meds: . aspirin  325 mg Oral Daily  . atorvastatin  40 mg Oral q1800  . enoxaparin (LOVENOX) injection  40 mg Subcutaneous Q24H  . insulin aspart  0-15 Units Subcutaneous TID WC   Continuous Infusions:    LOS: 1 day   Time spent: 20 minutes   Faye Ramsay, MD [facility] Pager [phone]  If 7PM-7AM, please contact night-coverage [email or web address] Password [facility] 09/27/2015, 6:40 PM

## 2015-09-28 ENCOUNTER — Encounter (HOSPITAL_COMMUNITY): Payer: BLUE CROSS/BLUE SHIELD

## 2015-09-28 LAB — GLUCOSE, CAPILLARY
GLUCOSE-CAPILLARY: 201 mg/dL — AB (ref 65–99)
GLUCOSE-CAPILLARY: 164 mg/dL — AB (ref 65–99)
GLUCOSE-CAPILLARY: 172 mg/dL — AB (ref 65–99)
Glucose-Capillary: 136 mg/dL — ABNORMAL HIGH (ref 65–99)

## 2015-09-28 LAB — BASIC METABOLIC PANEL
ANION GAP: 10 (ref 5–15)
BUN: 33 mg/dL — ABNORMAL HIGH (ref 6–20)
CHLORIDE: 104 mmol/L (ref 101–111)
CO2: 24 mmol/L (ref 22–32)
Calcium: 9.2 mg/dL (ref 8.9–10.3)
Creatinine, Ser: 1.56 mg/dL — ABNORMAL HIGH (ref 0.44–1.00)
GFR calc non Af Amer: 34 mL/min — ABNORMAL LOW (ref >60)
GFR, EST AFRICAN AMERICAN: 39 mL/min — AB (ref >60)
Glucose, Bld: 148 mg/dL — ABNORMAL HIGH (ref 65–99)
Potassium: 4.3 mmol/L (ref 3.5–5.1)
Sodium: 138 mmol/L (ref 135–145)

## 2015-09-28 LAB — CBC
HEMATOCRIT: 34.6 % — AB (ref 36.0–46.0)
HEMOGLOBIN: 10.9 g/dL — AB (ref 12.0–15.0)
MCH: 25.6 pg — ABNORMAL LOW (ref 26.0–34.0)
MCHC: 31.5 g/dL (ref 30.0–36.0)
MCV: 81.4 fL (ref 78.0–100.0)
Platelets: 211 10*3/uL (ref 150–400)
RBC: 4.25 MIL/uL (ref 3.87–5.11)
RDW: 14.5 % (ref 11.5–15.5)
WBC: 6.3 10*3/uL (ref 4.0–10.5)

## 2015-09-28 MED ORDER — SODIUM CHLORIDE 0.9 % IV SOLN
INTRAVENOUS | Status: DC
  Administered 2015-09-28: 15:00:00 via INTRAVENOUS

## 2015-09-28 NOTE — Progress Notes (Signed)
Patient ID: Wendy [name], female   DOB: February 14, 1951, 65 y.o.   MRN: [id number]    PROGRESS NOTE    Wendy [name]  [id number] DOB: [birth year] DOA: 09/26/2015  PCP: Dellia Nims, MD   Outpatient Specialists: None  Brief Narrative:  65 y.o. female with diabetes mellitus, hypertension, lipidemia who presented for evaluation of speech difficulty and right-sided weakness on the previous afternoon. She was last known well at 4 PM on 09/25/2015. She had a similar change in speech about one month earlier which apparently resolved, and for which she did not seek medical attention.  Assessment & Plan:  1. Subacute CVA  - Multiple predominately subcentimeter infarcts LEFT temporal, frontal and parietal lobes and MCA territory.  - Minimal petechial hemorrhage LEFT parietal lobe - Neurology is consulting and much appreciated - ECHO with EF 60% and grade II diastolic CHF  - PT evaluation done and HH PT requested, orders placed  - started on aspirin and will continue upon discharge  - plan for TEE in AM  2. Type II DM with retinopathy  - Managed with glipizide at home  - A1c 8.0% in February 2017, reflecting suboptimal control, but improved from priors  - SSI ordered   3. Hypertension, essential  - reasonably stable this AM  4. Hyperlipidemia  - continue statin   5. CKD stage III  - SCr is 1.46 on admission and remains stable  - BMP In AM  6. Morbid obesity due to access calories  - Body mass index is 39.46 kg/(m^2).  DVT prophylaxis: Lovenox SQ Code Status: Full Family Communication: Patient at bedside  Disposition Plan: Home 4/17  Consultants:   Neurology   Procedures:   None  Antimicrobials:   None  Subjective: Reports feeling better this AM, speech better.   Objective: Filed Vitals:   09/28/15 0118 09/28/15 0503 09/28/15 0927 09/28/15 1353  BP: 173/74 143/65 174/100 134/68  Pulse: 73 81 83 87  Temp: 98.7 F (37.1 C) 98.4 F (36.9 C) 98.9 F (37.2  C) 98.5 F (36.9 C)  TempSrc: Oral Oral Oral Oral  Resp: 18 16 18 18   Height:      Weight:      SpO2: 100% 100% 99% 100%    Intake/Output Summary (Last 24 hours) at 09/28/15 1518 Last data filed at 09/28/15 0800  Gross per 24 hour  Intake    240 ml  Output      0 ml  Net    240 ml   Filed Weights   09/26/15 2154  Weight: 104.327 kg (230 lb)    Examination:  General exam: Appears calm and comfortable  Respiratory system: Clear to auscultation. Respiratory effort normal. Cardiovascular system: S1 & S2 heard, RRR. No JVD, murmurs, rubs, gallops or clicks. No pedal edema. Gastrointestinal system: Abdomen is nondistended, soft and nontender.  Central nervous system: Alert and oriented. No focal neurological deficits. Extremities: Symmetric 5 x 5 power. Right hand grip 4/5, speech slow but clear   Data Reviewed: I have personally reviewed following labs and imaging studies  CBC:  Recent Labs Lab 09/26/15 2159 09/26/15 2216 09/28/15 0227  WBC 6.4  --  6.3  NEUTROABS 3.5  --   --   HGB 12.4 13.6 10.9*  HCT 37.2 40.0 34.6*  MCV 80.3  --  81.4  PLT 233  --  123456   Basic Metabolic Panel:  Recent Labs Lab 09/26/15 2159 09/26/15 2216 09/28/15 0227  NA 141 140 138  K  4.3 4.2 4.3  CL 103 103 104  CO2 27  --  24  GLUCOSE 154* 148* 148*  BUN 34* 33* 33*  CREATININE 1.46* 1.50* 1.56*  CALCIUM 9.5  --  9.2   Liver Function Tests:  Recent Labs Lab 09/26/15 2159  AST 19  ALT 14  ALKPHOS 79  BILITOT 0.4  PROT 8.1  ALBUMIN 3.8   Coagulation Profile:  Recent Labs Lab 09/26/15 2159  INR 0.98   CBG:  Recent Labs Lab 09/27/15 1110 09/27/15 1558 09/27/15 2219 09/28/15 0657 09/28/15 1105  GLUCAP 203* 158* 121* 136* 201*      Component Value Date/Time   COLORURINE YELLOW 09/26/2015 [place] 09/26/2015 2353   LABSPEC 1.015 09/26/2015 2353   PHURINE 7.0 09/26/2015 2353   GLUCOSEU NEGATIVE 09/26/2015 2353   HGBUR NEGATIVE 09/26/2015  2353   BILIRUBINUR NEGATIVE 09/26/2015 2353   BILIRUBINUR NEGATIVE 09/30/2011 [place] 09/26/2015 2353   PROTEINUR 30* 09/26/2015 2353   PROTEINUR TRACE 09/30/2011 1606   UROBILINOGEN 0.2 01/22/2013 2115   UROBILINOGEN 0.2 09/30/2011 1606   NITRITE NEGATIVE 09/26/2015 2353   NITRITE NEGATIVE 09/30/2011 1606   LEUKOCYTESUR NEGATIVE 09/26/2015 2353   Radiology Studies: Dg Chest 2 View 09/27/2015 Mild chronic appearing interstitial coarsening. No acute cardiopulmonary findings.  Ct Head Wo Contrast 09/26/2015  Lacunar infarction in the left putamen, probably subacute. No hemorrhage.   Mr Jodene Nam Head/brain Wo Cm 09/27/2015  Multiple predominately subcentimeter infarcts LEFT temporal, frontal and parietal lobes and MCA territory. Minimal petechial hemorrhage LEFT parietal lobe. Moderate white matter changes compatible with chronic small vessel ischemic disease. MRA HEAD: LEFT distal M1 emergent large vessel occlusion. Thready flow related enhancement of LEFT M2 compatible with early collateral formation. Mild stenosis RIGHT para ophthalmic internal carotid artery.   Scheduled Meds: . aspirin  325 mg Oral Daily  . atorvastatin  40 mg Oral q1800  . enoxaparin (LOVENOX) injection  40 mg Subcutaneous Q24H  . insulin aspart  0-15 Units Subcutaneous TID WC   Continuous Infusions: . sodium chloride       LOS: 2 days   Time spent: 20 minutes   Faye Ramsay, MD [facility] Pager [phone]  If 7PM-7AM, please contact night-coverage [email or web address] Password [facility] 09/28/2015, 3:18 PM

## 2015-09-29 ENCOUNTER — Encounter (HOSPITAL_COMMUNITY)
Admission: EM | Disposition: A | Discharge status: Home Health | Payer: Self-pay | Source: Home / Self Care | Attending: Internal Medicine

## 2015-09-29 ENCOUNTER — Inpatient Hospital Stay (HOSPITAL_COMMUNITY): Payer: Medicare Other

## 2015-09-29 ENCOUNTER — Encounter (HOSPITAL_COMMUNITY): Payer: Self-pay | Admitting: *Deleted

## 2015-09-29 DIAGNOSIS — I63032 Cerebral infarction due to thrombosis of left carotid artery: Secondary | ICD-10-CM

## 2015-09-29 DIAGNOSIS — I639 Cerebral infarction, unspecified: Secondary | ICD-10-CM

## 2015-09-29 DIAGNOSIS — I340 Nonrheumatic mitral (valve) insufficiency: Secondary | ICD-10-CM

## 2015-09-29 DIAGNOSIS — I10 Essential (primary) hypertension: Secondary | ICD-10-CM

## 2015-09-29 HISTORY — PX: TEE WITHOUT CARDIOVERSION: SHX5443

## 2015-09-29 HISTORY — PX: EP IMPLANTABLE DEVICE: SHX172B

## 2015-09-29 LAB — CBC
HCT: 34.9 % — ABNORMAL LOW (ref 36.0–46.0)
Hemoglobin: 11.4 g/dL — ABNORMAL LOW (ref 12.0–15.0)
MCH: 26.8 pg (ref 26.0–34.0)
MCHC: 32.7 g/dL (ref 30.0–36.0)
MCV: 81.9 fL (ref 78.0–100.0)
PLATELETS: 217 10*3/uL (ref 150–400)
RBC: 4.26 MIL/uL (ref 3.87–5.11)
RDW: 14.6 % (ref 11.5–15.5)
WBC: 6.2 10*3/uL (ref 4.0–10.5)

## 2015-09-29 LAB — BASIC METABOLIC PANEL
ANION GAP: 10 (ref 5–15)
BUN: 28 mg/dL — ABNORMAL HIGH (ref 6–20)
CALCIUM: 9.2 mg/dL (ref 8.9–10.3)
CO2: 23 mmol/L (ref 22–32)
Chloride: 108 mmol/L (ref 101–111)
Creatinine, Ser: 1.41 mg/dL — ABNORMAL HIGH (ref 0.44–1.00)
GFR calc Af Amer: 44 mL/min — ABNORMAL LOW (ref >60)
GFR calc non Af Amer: 38 mL/min — ABNORMAL LOW (ref >60)
GLUCOSE: 154 mg/dL — AB (ref 65–99)
Potassium: 4.4 mmol/L (ref 3.5–5.1)
SODIUM: 141 mmol/L (ref 135–145)

## 2015-09-29 LAB — GLUCOSE, CAPILLARY
Glucose-Capillary: 146 mg/dL — ABNORMAL HIGH (ref 65–99)
Glucose-Capillary: 110 mg/dL — ABNORMAL HIGH (ref 65–99)
Glucose-Capillary: 218 mg/dL — ABNORMAL HIGH (ref 65–99)
Glucose-Capillary: 200 mg/dL — ABNORMAL HIGH (ref 65–99)

## 2015-09-29 SURGERY — ECHOCARDIOGRAM, TRANSESOPHAGEAL
Anesthesia: Moderate Sedation

## 2015-09-29 SURGERY — LOOP RECORDER INSERTION

## 2015-09-29 MED ORDER — ASPIRIN 325 MG PO TABS: 325.0000 mg | ORAL_TABLET | Freq: Every day | ORAL | Status: DC

## 2015-09-29 MED ORDER — LIDOCAINE VISCOUS 2 % MT SOLN
OROMUCOSAL | Status: AC
  Filled 2015-09-29: qty 15

## 2015-09-29 MED ORDER — ONDANSETRON HCL 4 MG/2ML IJ SOLN: 4.0000 mg | Freq: Four times a day (QID) | INTRAMUSCULAR | Status: DC | PRN

## 2015-09-29 MED ORDER — FENTANYL CITRATE (PF) 100 MCG/2ML IJ SOLN
INTRAMUSCULAR | Status: DC | PRN
  Administered 2015-09-29 (×2): 25 ug via INTRAVENOUS

## 2015-09-29 MED ORDER — ATORVASTATIN CALCIUM 40 MG PO TABS: 40.0000 mg | ORAL_TABLET | Freq: Every day | ORAL | Status: DC

## 2015-09-29 MED ORDER — MIDAZOLAM HCL 10 MG/2ML IJ SOLN
INTRAMUSCULAR | Status: DC | PRN
  Administered 2015-09-29: 2 mg via INTRAVENOUS
  Administered 2015-09-29: 1 mg via INTRAVENOUS
  Administered 2015-09-29: 2 mg via INTRAVENOUS

## 2015-09-29 MED ORDER — LIDOCAINE-EPINEPHRINE 1 %-1:100000 IJ SOLN
INTRAMUSCULAR | Status: AC
  Filled 2015-09-29: qty 1

## 2015-09-29 MED ORDER — FENTANYL CITRATE (PF) 100 MCG/2ML IJ SOLN
INTRAMUSCULAR | Status: AC
  Filled 2015-09-29: qty 2

## 2015-09-29 MED ORDER — MIDAZOLAM HCL 5 MG/ML IJ SOLN
INTRAMUSCULAR | Status: AC
  Filled 2015-09-29: qty 2

## 2015-09-29 MED ORDER — BUTAMBEN-TETRACAINE-BENZOCAINE 2-2-14 % EX AERO
INHALATION_SPRAY | CUTANEOUS | Status: DC | PRN
  Administered 2015-09-29: 2 via TOPICAL

## 2015-09-29 MED ORDER — HEPARIN SODIUM (PORCINE) 5000 UNIT/ML IJ SOLN
5000.0000 [IU] | Freq: Three times a day (TID) | INTRAMUSCULAR | Status: DC
  Administered 2015-09-29 – 2015-09-30 (×3): 5000 [IU] via SUBCUTANEOUS
  Filled 2015-09-29 (×3): qty 1

## 2015-09-29 MED ORDER — SODIUM CHLORIDE 0.9 % IV SOLN: INTRAVENOUS | Status: DC

## 2015-09-29 MED ORDER — LIDOCAINE-EPINEPHRINE 1 %-1:100000 IJ SOLN
INTRAMUSCULAR | Status: DC | PRN
  Administered 2015-09-29: 20 mL

## 2015-09-29 MED ORDER — DIPHENHYDRAMINE HCL 50 MG/ML IJ SOLN
INTRAMUSCULAR | Status: AC
  Filled 2015-09-29: qty 1

## 2015-09-29 MED ORDER — ACETAMINOPHEN 325 MG PO TABS: 325.0000 mg | ORAL_TABLET | ORAL | Status: DC | PRN

## 2015-09-29 SURGICAL SUPPLY — 2 items
LOOP REVEAL LINQSYS (Prosthesis & Implant Heart) ×1 IMPLANT
PACK LOOP INSERTION (CUSTOM PROCEDURE TRAY) ×2 IMPLANT

## 2015-09-29 NOTE — H&P (Signed)
ELECTROPHYSIOLOGY CONSULT NOTE  Patient ID: Wendy [name] MRN: [id number], DOB/AGE: 12-01-1950   Admit date: 09/26/2015 Date of Consult: 09/29/2015  Primary Physician: Dellia Nims, MD Primary Cardiologist: None Reason for Consultation: Cryptogenic stroke ; recommendations regarding Implantable Loop Recorder  History of Present Illness Wendy [name] was admitted on 09/26/2015 with speech difficulty and right-sided weakness. They first developed symptoms about 1 month prior but did not seek medical attention. Imaging demonstrated: CT scan of her head showed lacunar infarction in the left putamen thought to be subacute. MRI showed multiple predominately subcentimeter infarcts LEFT temporal, frontal and parietal lobes and MCA territory. she has undergone workup for stroke including echocardiogram and carotid dopplers. The patient has been monitored on telemetry which has demonstrated sinus rhythm with no arrhythmias. Inpatient stroke work-up is to be completed with a TEE.   Echocardiogram this admission demonstrated EF 60-65% with no RWMA and G2DD. Lab work is reviewed.  Prior to admission, the patient denies chest pain, shortness of breath, dizziness, palpitations, or syncope. They are recovering from their stroke with plans to go home with home health at discharge.  EP has been asked to evaluate for placement of an implantable loop recorder to monitor for atrial fibrillation.     Past Medical History  Diagnosis Date  . Sarcoidosis ([place])   . Hypertension   . Diabetes mellitus      Surgical History:  Past Surgical History  Procedure Laterality Date  . Intraocular lens insertion       Prescriptions prior to admission  Medication Sig Dispense Refill Last Dose  . glipiZIDE (GLUCOTROL) 5 MG tablet Take 1 tablet (5 mg total) by mouth 2 (two) times daily before a meal. 180 tablet 1 09/25/2015 at Unknown time  . hydrochlorothiazide  (HYDRODIURIL) 25 MG tablet Take 1 tablet (25 mg total) by mouth daily. 30 tablet 3 09/25/2015 at Unknown time  . Nutritional Supplements (NUTRITIONAL SUPPLEMENT PO) Take 1 tablet by mouth daily. High Blood Sugar.   09/25/2015 at Unknown time  . Disposable Gloves (LATEX EXAM GLOVES) MISC Use gloves when applying capsaicin cream 100 each 0   . glucose blood test strip Check blood sugar one time a day 50 each 12   . Lancet Device MISC For True Track meter - use to check blood sugars once a week. Dx code: 250.00. 100 each 11 Taking  . Lancets (ACCU-CHEK MULTICLIX) lancets Use as instructed 100 each 12 Taking    Inpatient Medications:  . aspirin 325 mg Oral Daily  . atorvastatin 40 mg Oral q1800  . enoxaparin (LOVENOX) injection 40 mg Subcutaneous Q24H  . insulin aspart 0-15 Units Subcutaneous TID WC    Allergies:  Allergies  Allergen Reactions  . Metformin And Related Nausea Only    "severe itching"    Social History   Social History  . Marital Status: Married    Spouse Name: N/A  . Number of Children: N/A  . Years of Education: N/A   Occupational History  . Not on file.   Social History Main Topics  . Smoking status: Former Smoker    Quit date: 08/09/1976  . Smokeless tobacco: Not on file  . Alcohol Use: No  . Drug Use: No  . Sexual Activity: Not on file   Other Topics Concern  . Not on file   Social History Narrative     History reviewed. No pertinent family history.    Review of Systems: General: No chills, fever, night sweats or weight changes  Cardiovascular: No chest pain, dyspnea on exertion, edema, orthopnea, palpitations, paroxysmal nocturnal dyspnea Dermatological: No rash, lesions or masses Respiratory: No cough, dyspnea Urologic: No hematuria, dysuria Abdominal: No nausea, vomiting, diarrhea, bright red blood per rectum, melena, or  hematemesis Neurologic: No visual changes, weakness, changes in mental status All other systems reviewed and are otherwise negative except as noted above.  Physical Exam: Filed Vitals:   09/28/15 2217 09/29/15 0244 09/29/15 0436 09/29/15 0900  BP: 168/69 153/71 165/66   Pulse: 76 83 77   Temp: 98.8 F (37.1 C) 97.9 F (36.6 C) 97.5 F (36.4 C)   TempSrc: Oral Oral Oral   Resp: 18 18 18    Height:      Weight:      SpO2: 100% 99% 95% 97%    GEN- The patient is well appearing, alert and oriented x 3 today. Obese.  Head- normocephalic, atraumatic Eyes- Sclera clear, conjunctiva pink Ears- hearing intact Oropharynx- clear Neck- supple Lungs- Clear to ausculation bilaterally, normal work of breathing Heart- Regular rate and rhythm, no murmurs, rubs or gallops  GI- soft, NT, ND, + BS Extremities- no clubbing, cyanosis, or edema MS- no significant deformity or atrophy Skin- no rash or lesion Psych- euthymic mood, full affect   Labs:  Lipid Panel   Labs (Brief)       Component Value Date/Time   CHOL 240* 09/23/2014 1423   TRIG 89 09/23/2014 1423   HDL 59 09/23/2014 1423   CHOLHDL 4.1 09/23/2014 1423   VLDL 18 09/23/2014 1423   LDLCALC 163* 09/23/2014 1423        Lab Results  Component Value Date   WBC 6.2 09/29/2015   HGB 11.4* 09/29/2015   HCT 34.9* 09/29/2015   MCV 81.9 09/29/2015   PLT 217 09/29/2015    Recent Labs Lab 09/26/15 2159  09/29/15 0706  NA 141 < > 141  K 4.3 < > 4.4  CL 103 < > 108  CO2 27 < > 23  BUN 34* < > 28*  CREATININE 1.46* < > 1.41*  CALCIUM 9.5 < > 9.2  PROT 8.1 --  --   BILITOT 0.4 --  --   ALKPHOS 79 --  --   ALT 14 --  --   AST 19 --  --   GLUCOSE 154* < > 154*  < > = values in this interval not displayed.  Recent Labs    No results found for:  CKTOTAL, CKMB, CKMBINDEX, TROPONINI      Imaging Results    Radiology/Studies: Dg Chest 2 View  09/27/2015 CLINICAL DATA: Slurred speech, confusion and right-sided weakness. Onset yesterday afternoon. EXAM: CHEST 2 VIEW COMPARISON: 04/09/2013 FINDINGS: There is mild chronic appearing interstitial coarsening it. This may be related to the described history of sarcoidosis. No alveolar opacities. No effusions. Normal hilar, mediastinal and cardiac contours. IMPRESSION: Mild chronic appearing interstitial coarsening. No acute cardiopulmonary findings. Electronically Signed By: Andreas Newport M.D. On: 09/27/2015 00:35   Ct Head Wo Contrast  09/26/2015 CLINICAL DATA: Slurred speech and confusion. Onset yesterday afternoon. EXAM: CT HEAD WITHOUT CONTRAST TECHNIQUE: Contiguous axial images were obtained from the base of the skull through the vertex without intravenous contrast. COMPARISON: None. FINDINGS: There is no intracranial hemorrhage. There is no extra-axial fluid collection. There is lacunar infarction in the left putamen, probably subacute. Slight generalized atrophy. Ventricles and basal cisterns are unremarkable. No significant bony abnormality. Visible paranasal sinuses are clear. Orbits are unremarkable. IMPRESSION: Lacunar infarction in the  left putamen, probably subacute. No hemorrhage. Electronically Signed By: Andreas Newport M.D. On: 09/26/2015 22:49   Mr Brain Wo Contrast  09/27/2015 CLINICAL DATA: Mild dysarthria and expressive aphasia, RIGHT grip weakness. Symptoms for 30 hours. History of hypertension, diabetes and sarcoidosis. EXAM: MRI HEAD WITHOUT CONTRAST MRA HEAD WITHOUT CONTRAST TECHNIQUE: Multiplanar, multiecho pulse sequences of the brain and surrounding structures were obtained without intravenous contrast. Angiographic images of the head were obtained using MRA technique without contrast. COMPARISON: CT head August 26, 2015 FINDINGS: MRI HEAD FINDINGS  INTRACRANIAL CONTENTS: Patchy reduced diffusion LEFT parietal lobe cortex and subcortical white matter. Diffuse subcentimeter foci of reduced diffusion LEFT temporal lobe and punctate focus of reduced diffusion LEFT posterior frontal lobe. Lesions show low ADC values and FLAIR T2 hyperintense signal. Subcentimeter focus of susceptibility artifact LEFT parietal gyrus. The ventricles and sulci are normal for patient's age. No suspicious parenchymal signal, mass lesions, mass effect. Patchy supratentorial white matter FLAIR T2 hyperintensities. LEFT inferior basal ganglia old perivascular space , minimal surrounding FLAIR T2 hyperintense suggests adjacent lacunar infarct. Ventricles and sulci are normal for patient's age. No abnormal extra-axial fluid collections. No extra-axial masses though, contrast enhanced sequences would be more sensitive. ORBITS: The included ocular globes and orbital contents are non-suspicious. SINUSES: Trace paranasal sinus mucosal thickening without air-fluid levels. Trace mastoid effusions. SKULL/SOFT TISSUES: No abnormal sellar expansion. No suspicious calvarial bone marrow signal. Craniocervical junction maintained. MRA HEAD FINDINGS Anterior circulation: Normal flow related enhancement of the included cervical, petrous, cavernous and supraclinoid internal carotid arteries. Mild stenosis RIGHT para ophthalmic internal carotid artery, corresponding to calcific atherosclerosis on yesterday's head CT. Patent anterior communicating artery. Complete loss of LEFT distal M1 flow related enhancement at trifurcation. Thready flow related enhancement of the proximal M2 segments. No high-grade stenosis, abnormal luminal irregularity, aneurysm. Posterior circulation: Codominant vertebral artery's. Basilar artery is patent, with normal flow related enhancement of the main branch vessels. Normal flow related enhancement of the posterior cerebral arteries. No large vessel occlusion, high-grade stenosis,  abnormal luminal irregularity, aneurysm. IMPRESSION: MRI HEAD: Multiple predominately subcentimeter infarcts LEFT temporal, frontal and parietal lobes and MCA territory. Minimal petechial hemorrhage LEFT parietal lobe. Moderate white matter changes compatible with chronic small vessel ischemic disease. MRA HEAD: LEFT distal M1 emergent large vessel occlusion. Thready flow related enhancement of LEFT M2 compatible with early collateral formation. Mild stenosis RIGHT para ophthalmic internal carotid artery. These results will be called to the ordering clinician or representative by the Radiologist Assistant, and communication documented in the PACS or zVision Dashboard. Electronically Signed By: Elon Alas M.D. On: 09/27/2015 05:32   Mr Jodene Nam Head/brain Wo Cm  09/27/2015 CLINICAL DATA: Mild dysarthria and expressive aphasia, RIGHT grip weakness. Symptoms for 30 hours. History of hypertension, diabetes and sarcoidosis. EXAM: MRI HEAD WITHOUT CONTRAST MRA HEAD WITHOUT CONTRAST TECHNIQUE: Multiplanar, multiecho pulse sequences of the brain and surrounding structures were obtained without intravenous contrast. Angiographic images of the head were obtained using MRA technique without contrast. COMPARISON: CT head August 26, 2015 FINDINGS: MRI HEAD FINDINGS INTRACRANIAL CONTENTS: Patchy reduced diffusion LEFT parietal lobe cortex and subcortical white matter. Diffuse subcentimeter foci of reduced diffusion LEFT temporal lobe and punctate focus of reduced diffusion LEFT posterior frontal lobe. Lesions show low ADC values and FLAIR T2 hyperintense signal. Subcentimeter focus of susceptibility artifact LEFT parietal gyrus. The ventricles and sulci are normal for patient's age. No suspicious parenchymal signal, mass lesions, mass effect. Patchy supratentorial white matter FLAIR T2 hyperintensities. LEFT inferior basal ganglia old  perivascular space , minimal surrounding FLAIR T2 hyperintense suggests adjacent  lacunar infarct. Ventricles and sulci are normal for patient's age. No abnormal extra-axial fluid collections. No extra-axial masses though, contrast enhanced sequences would be more sensitive. ORBITS: The included ocular globes and orbital contents are non-suspicious. SINUSES: Trace paranasal sinus mucosal thickening without air-fluid levels. Trace mastoid effusions. SKULL/SOFT TISSUES: No abnormal sellar expansion. No suspicious calvarial bone marrow signal. Craniocervical junction maintained. MRA HEAD FINDINGS Anterior circulation: Normal flow related enhancement of the included cervical, petrous, cavernous and supraclinoid internal carotid arteries. Mild stenosis RIGHT para ophthalmic internal carotid artery, corresponding to calcific atherosclerosis on yesterday's head CT. Patent anterior communicating artery. Complete loss of LEFT distal M1 flow related enhancement at trifurcation. Thready flow related enhancement of the proximal M2 segments. No high-grade stenosis, abnormal luminal irregularity, aneurysm. Posterior circulation: Codominant vertebral artery's. Basilar artery is patent, with normal flow related enhancement of the main branch vessels. Normal flow related enhancement of the posterior cerebral arteries. No large vessel occlusion, high-grade stenosis, abnormal luminal irregularity, aneurysm. IMPRESSION: MRI HEAD: Multiple predominately subcentimeter infarcts LEFT temporal, frontal and parietal lobes and MCA territory. Minimal petechial hemorrhage LEFT parietal lobe. Moderate white matter changes compatible with chronic small vessel ischemic disease. MRA HEAD: LEFT distal M1 emergent large vessel occlusion. Thready flow related enhancement of LEFT M2 compatible with early collateral formation. Mild stenosis RIGHT para ophthalmic internal carotid artery. These results will be called to the ordering clinician or representative by the Radiologist Assistant, and communication documented in the PACS or  zVision Dashboard. Electronically Signed By: Elon Alas M.D. On: 09/27/2015 05:32     12-lead ECG  All prior EKG's in EPIC reviewed with no documented atrial fibrillation  Telemetry with no afib. Sinus tach with PVCs  Assessment and Plan:  1. Cryptogenic stroke The patient presents with cryptogenic stroke. The patient has a TEE planned for this AM. I spoke at length with the patient about monitoring for afib with either a 30 day event monitor or an implantable loop recorder. Risks, benefits, and alteratives to implantable loop recorder were discussed with the patient today. At this time, the patient is very clear in their decision to proceed with implantable loop recorder.   Wound care was reviewed with the patient (keep incision clean and dry for 3 days). Wound check scheduled for 10/09/15 at 11:30am.   Please call with questions.   Crista Luria 09/29/2015  [id number]     EP Attending  Patient seen and examined. Agree with above. Will plan to proceed with ILR insertion for cryptogenic stroke.  Mikle Bosworth.D.

## 2015-09-29 NOTE — H&P (View-Only) (Signed)
Patient ID: Wendy [name], female   DOB: 04/03/1951, 65 y.o.   MRN: [id number]    PROGRESS NOTE    Wendy [name]  [id number] DOB: 07/20/1950 DOA: 09/26/2015  PCP: Dellia Nims, MD   Outpatient Specialists: None  Brief Narrative:  65 y.o. female with diabetes mellitus, hypertension, lipidemia who presented for evaluation of speech difficulty and right-sided weakness on the previous afternoon. She was last known well at 4 PM on 09/25/2015. She had a similar change in speech about one month earlier which apparently resolved, and for which she did not seek medical attention.  Assessment & Plan:  1. Subacute CVA  - Multiple predominately subcentimeter infarcts LEFT temporal, frontal and parietal lobes and MCA territory.  - Minimal petechial hemorrhage LEFT parietal lobe - Neurology is consulting and much appreciated - ECHO with EF 60% and grade II diastolic CHF  - PT evaluation done and HH PT requested, orders placed  - started on aspirin and will continue upon discharge  - plan for TEE in AM  2. Type II DM with retinopathy  - Managed with glipizide at home  - A1c 8.0% in February 2017, reflecting suboptimal control, but improved from priors  - SSI ordered   3. Hypertension, essential  - reasonably stable this AM  4. Hyperlipidemia  - continue statin   5. CKD stage III  - SCr is 1.46 on admission and remains stable  - BMP In AM  6. Morbid obesity due to access calories  - Body mass index is 39.46 kg/(m^2).  DVT prophylaxis: Lovenox SQ Code Status: Full Family Communication: Patient at bedside  Disposition Plan: Home 4/17  Consultants:   Neurology   Procedures:   None  Antimicrobials:   None  Subjective: Reports feeling better this AM, speech better.   Objective: Filed Vitals:   09/28/15 0118 09/28/15 0503 09/28/15 0927 09/28/15 1353  BP: 173/74 143/65 174/100 134/68  Pulse: 73 81 83 87  Temp: 98.7 F (37.1 C) 98.4 F (36.9 C) 98.9 F (37.2  C) 98.5 F (36.9 C)  TempSrc: Oral Oral Oral Oral  Resp: 18 16 18 18   Height:      Weight:      SpO2: 100% 100% 99% 100%    Intake/Output Summary (Last 24 hours) at 09/28/15 1518 Last data filed at 09/28/15 0800  Gross per 24 hour  Intake    240 ml  Output      0 ml  Net    240 ml   Filed Weights   09/26/15 2154  Weight: 104.327 kg (230 lb)    Examination:  General exam: Appears calm and comfortable  Respiratory system: Clear to auscultation. Respiratory effort normal. Cardiovascular system: S1 & S2 heard, RRR. No JVD, murmurs, rubs, gallops or clicks. No pedal edema. Gastrointestinal system: Abdomen is nondistended, soft and nontender.  Central nervous system: Alert and oriented. No focal neurological deficits. Extremities: Symmetric 5 x 5 power. Right hand grip 4/5, speech slow but clear   Data Reviewed: I have personally reviewed following labs and imaging studies  CBC:  Recent Labs Lab 09/26/15 2159 09/26/15 2216 09/28/15 0227  WBC 6.4  --  6.3  NEUTROABS 3.5  --   --   HGB 12.4 13.6 10.9*  HCT 37.2 40.0 34.6*  MCV 80.3  --  81.4  PLT 233  --  123456   Basic Metabolic Panel:  Recent Labs Lab 09/26/15 2159 09/26/15 2216 09/28/15 0227  NA 141 140 138  K  4.3 4.2 4.3  CL 103 103 104  CO2 27  --  24  GLUCOSE 154* 148* 148*  BUN 34* 33* 33*  CREATININE 1.46* 1.50* 1.56*  CALCIUM 9.5  --  9.2   Liver Function Tests:  Recent Labs Lab 09/26/15 2159  AST 19  ALT 14  ALKPHOS 79  BILITOT 0.4  PROT 8.1  ALBUMIN 3.8   Coagulation Profile:  Recent Labs Lab 09/26/15 2159  INR 0.98   CBG:  Recent Labs Lab 09/27/15 1110 09/27/15 1558 09/27/15 2219 09/28/15 0657 09/28/15 1105  GLUCAP 203* 158* 121* 136* 201*      Component Value Date/Time   COLORURINE YELLOW 09/26/2015 [place] 09/26/2015 2353   LABSPEC 1.015 09/26/2015 2353   PHURINE 7.0 09/26/2015 2353   GLUCOSEU NEGATIVE 09/26/2015 2353   HGBUR NEGATIVE 09/26/2015  2353   BILIRUBINUR NEGATIVE 09/26/2015 2353   BILIRUBINUR NEGATIVE 09/30/2011 [place] 09/26/2015 2353   PROTEINUR 30* 09/26/2015 2353   PROTEINUR TRACE 09/30/2011 1606   UROBILINOGEN 0.2 01/22/2013 2115   UROBILINOGEN 0.2 09/30/2011 1606   NITRITE NEGATIVE 09/26/2015 2353   NITRITE NEGATIVE 09/30/2011 1606   LEUKOCYTESUR NEGATIVE 09/26/2015 2353   Radiology Studies: Dg Chest 2 View 09/27/2015 Mild chronic appearing interstitial coarsening. No acute cardiopulmonary findings.  Ct Head Wo Contrast 09/26/2015  Lacunar infarction in the left putamen, probably subacute. No hemorrhage.   Mr Jodene Nam Head/brain Wo Cm 09/27/2015  Multiple predominately subcentimeter infarcts LEFT temporal, frontal and parietal lobes and MCA territory. Minimal petechial hemorrhage LEFT parietal lobe. Moderate white matter changes compatible with chronic small vessel ischemic disease. MRA HEAD: LEFT distal M1 emergent large vessel occlusion. Thready flow related enhancement of LEFT M2 compatible with early collateral formation. Mild stenosis RIGHT para ophthalmic internal carotid artery.   Scheduled Meds: . aspirin  325 mg Oral Daily  . atorvastatin  40 mg Oral q1800  . enoxaparin (LOVENOX) injection  40 mg Subcutaneous Q24H  . insulin aspart  0-15 Units Subcutaneous TID WC   Continuous Infusions: . sodium chloride       LOS: 2 days   Time spent: 20 minutes   Faye Ramsay, MD [facility] Pager [phone]  If 7PM-7AM, please contact night-coverage [email or web address] Password [email or web address] 09/28/2015, 3:18 PM

## 2015-09-29 NOTE — Care Management Note (Signed)
Case Management Note  Patient Details  Name: BRYELLA DIVINEY MRN: [id number] Date of Birth: 01/24/1951  Subjective/Objective:                    Action/Plan: Patient discharging home today with [facility] services. CM met with the patient and her family and provided them a list of [place] agencies in the [place] area. She selected [facility]. Mary with [facility] accepted the referral. Pt also with orders for walker and 3 in 1. Jermaine with [facility] DME notified and delivered the equipment to the room. Will update the bedside RN.   Expected Discharge Date:                  Expected Discharge Plan:  [place]  In-House Referral:     Discharge planning Services  CM Consult  Post Acute Care Choice:  Home Health, Durable Medical Equipment Choice offered to:  Patient  DME Arranged:  3-N-1, Walker rolling DME Agency:  [place]:  RN, PT, OT, Speech Therapy [place] Agency:  [place]  Status of Service:  Completed, signed off  Medicare Important Message Given:  Yes Date Medicare IM Given:    Medicare IM give by:    Date Additional Medicare IM Given:    Additional Medicare Important Message give by:     If discussed at [place] of Stay Meetings, dates discussed:    Additional Comments:  Pollie Friar, RN 09/29/2015, 4:09 PM

## 2015-09-29 NOTE — Discharge Summary (Signed)
Physician Discharge Summary  Wendy [name] [id number] DOB: [birth year] DOA: 09/26/2015  PCP: Dellia Nims, MD  Admit date: 09/26/2015 Discharge date: 09/29/2015  Recommendations for Outpatient Follow-up:  1. Pt will need to follow up with PCP in 2-3 weeks post discharge 2. Please obtain BMP to evaluate electrolytes and kidney function 3. Please also check CBC to evaluate Hg and Hct levels  Discharge Diagnoses:  Principal Problem:   CVA (cerebral infarction) Active Problems:   DM type 2 with diabetic background retinopathy ([place])   Essential hypertension, benign   HLD (hyperlipidemia)   Chronic kidney disease   Stroke (cerebrum) (HCC)  Discharge Condition: Stable  Diet recommendation: Heart healthy diet discussed in details   Brief Narrative:  65 y.o. female with diabetes mellitus, hypertension, lipidemia who presented for evaluation of speech difficulty and right-sided weakness on the previous afternoon. She was last known well at 4 PM on 09/25/2015. She had a similar change in speech about one month earlier which apparently resolved, and for which she did not seek medical attention.  Assessment & Plan:  1. Subacute CVA  - Multiple predominately subcentimeter infarcts LEFT temporal, frontal and parietal lobes and MCA territory.  - Minimal petechial hemorrhage LEFT parietal lobe - Neurology is consulting and much appreciated - ECHO with EF 60% and grade II diastolic CHF  - PT evaluation done and HH PT requested, orders placed  - started on aspirin and will continue upon discharge  -  ? PFO on TEE, needs lower extremity doppler prior to discharge   2. Type II DM with retinopathy  - Managed with glipizide at home  - A1c 8.0% in February 2017, reflecting suboptimal control, but improved from priors   3. Hypertension, essential  - reasonably stable this AM  4. Hyperlipidemia  - continue statin   5. CKD stage III  - SCr is 1.46 on admission and remains stable   - BMP In AM  6. Morbid obesity due to access calories  - Body mass index is 39.46 kg/(m^2).  DVT prophylaxis: Lovenox SQ Code Status: Full Family Communication: Patient at bedside  Disposition Plan: Home 4/18 if LE doppler negative   Consultants:   Neurology  Procedures:   None  Antimicrobials:   None  Discharge Exam: Filed Vitals:   09/29/15 1320 09/29/15 1330  BP: 175/82 177/54  Pulse: 72 77  Temp:    Resp: 19 21   Filed Vitals:   09/29/15 1300 09/29/15 1310 09/29/15 1320 09/29/15 1330  BP: 160/54 168/73 175/82 177/54  Pulse: 71 71 72 77  Temp:      TempSrc:      Resp: 18 18 19 21   Height:      Weight:      SpO2: 99% 98% 99% 100%    General: Pt is alert, follows commands appropriately, not in acute distress Cardiovascular: Regular rate and rhythm, S1/S2 +, no murmurs, no rubs, no gallops Respiratory: Clear to auscultation bilaterally, no wheezing, no crackles, no rhonchi Abdominal: Soft, non tender, non distended, bowel sounds +, no guarding   Discharge Instructions  Discharge Instructions    Diet - low sodium heart healthy    Complete by:  As directed      Increase activity slowly    Complete by:  As directed             Medication List    STOP taking these medications        Latex Exam Gloves Misc  TAKE these medications        accu-chek multiclix lancets  Use as instructed     aspirin 325 MG tablet  Take 1 tablet (325 mg total) by mouth daily.     atorvastatin 40 MG tablet  Commonly known as:  LIPITOR  Take 1 tablet (40 mg total) by mouth daily at 6 PM.     glipiZIDE 5 MG tablet  Commonly known as:  GLUCOTROL  Take 1 tablet (5 mg total) by mouth 2 (two) times daily before a meal.     glucose blood test strip  Check blood sugar one time a day     hydrochlorothiazide 25 MG tablet  Commonly known as:  HYDRODIURIL  Take 1 tablet (25 mg total) by mouth daily.     Lancet Device Misc  For True Track meter - use to check  blood sugars once a week. Dx code: 250.00.     NUTRITIONAL SUPPLEMENT PO  Take 1 tablet by mouth daily. High Blood Sugar.           Follow-up Information    Follow up with [facility] On 10/09/2015.   Why:  For wound re-check @ 11:30am in [address]   Contact information:   [place] [phone]      Follow up with Dellia Nims, MD.   Specialty:  Internal Medicine   Contact information:   [place] [phone]       Call Faye Ramsay, MD.   Specialty:  Internal Medicine   Why:  As needed   Contact information:   [address] [place] [address] [phone]        The results of significant diagnostics from this hospitalization (including imaging, microbiology, ancillary and laboratory) are listed below for reference.     Microbiology: No results found for this or any previous visit (from the past 240 hour(s)).   Labs: Basic Metabolic Panel:  Recent Labs Lab 09/26/15 2159 09/26/15 2216 09/28/15 0227 09/29/15 0706  NA 141 140 138 141  K 4.3 4.2 4.3 4.4  CL 103 103 104 108  CO2 27  --  24 23  GLUCOSE 154* 148* 148* 154*  BUN 34* 33* 33* 28*  CREATININE 1.46* 1.50* 1.56* 1.41*  CALCIUM 9.5  --  9.2 9.2   Liver Function Tests:  Recent Labs Lab 09/26/15 2159  AST 19  ALT 14  ALKPHOS 79  BILITOT 0.4  PROT 8.1  ALBUMIN 3.8   No results for input(s): LIPASE, AMYLASE in the last 168 hours. No results for input(s): AMMONIA in the last 168 hours. CBC:  Recent Labs Lab 09/26/15 2159 09/26/15 2216 09/28/15 0227 09/29/15 0706  WBC 6.4  --  6.3 6.2  NEUTROABS 3.5  --   --   --   HGB 12.4 13.6 10.9* 11.4*  HCT 37.2 40.0 34.6* 34.9*  MCV 80.3  --  81.4 81.9  PLT 233  --  211 217   CBG:  Recent Labs Lab 09/28/15 1105 09/28/15 1606 09/28/15 2141 09/29/15 0644 09/29/15 0858  GLUCAP 201*  164* 172* 146* 110*     SIGNED: Time coordinating discharge: 30 minutes  MAGICK-Maylie Ashton, MD  [facility] 09/29/2015, 3:13 PM Pager [phone]  If 7PM-7AM, please contact night-coverage [email or web address] Password [email or web address]

## 2015-09-29 NOTE — Progress Notes (Signed)
PT Cancellation Note  Patient Details Name: Wendy [name] MRN: [id number] DOB: 23-Oct-1950   Cancelled Treatment:    Reason Eval/Treat Not Completed: Patient at procedure or test/unavailable. Pt at TEE this AM and now awaiting for bilat LE venous doppler for concern of bilat DVTs. Spoke with patient, RN, and OT. Pt walking around at supervision level. PT to await result of venous doppler test and will then do further balance testing as able.   Kingsley Callander 09/29/2015, 4:00 PM   Kittie Plater, PT, DPT Pager #: [phone] Office #: [phone]

## 2015-09-29 NOTE — Progress Notes (Addendum)
  OT treatment note    09/29/15 1302  OT Visit Information  Last OT Received On 09/29/15  Assistance Needed +1  History of Present Illness Pt is a 65 y.o. female with a PMH significant for type 2 diabetes mellitus, hypertension, chronic kidney disease stage III, and hyperlipidemia. Pt presented to the ED with speech difficulty and R sided weakness that began approximately 30 hours prior. Per pt, she had similar symptoms about a month ago which improved but did not resolve. MRI revealed Multiple predominately subcentimeter infarcts LEFT temporal, frontal and parietal lobes and MCA territory; Minimal petechial hemorrhage LEFT parietal lobe.  OT Time Calculation  OT Start Time (ACUTE ONLY) 1032  OT Stop Time (ACUTE ONLY) 1053  OT Time Calculation (min) 21 min  Precautions  Precautions Fall  Pain Assessment  Pain Assessment 0-10  Pain Score (7-8)  Pain Location left knee  Pain Descriptors / Indicators Sharp  Pain Intervention(s) Monitored during session  Cognition  Arousal/Alertness Awake/alert  Behavior During Therapy WFL for tasks assessed/performed  Overall Cognitive Status Unsure of baseline; decreased short term memory  ADL  Overall ADL's  Needs assistance/impaired  Grooming Wash/dry face;Supervision/safety;Standing (retreived wash cloth from cabinet)  Upper Body Bathing Supervision/ safety;Standing  Lower Body Bathing Supervison/ safety (sitting and standing; pt retrieved items from closet)  Upper Body Dressing  Set up;Supervision/safety;Standing  Lower Body Dressing Set up;Sit to/from stand;Supervision/safety (donned/doffed socks sitting and donned panties-sit to stand)  Armed forces technical officer Supervision/safety;Ambulation (sit to stand from bed and chair)  Functional mobility during ADLs Supervision/safety  General ADL Comments Educated on safety tips.   Bed Mobility  Overal bed mobility Modified Independent (supine to sit)  Restrictions  Weight Bearing Restrictions No   Vision- Assessment  Vision Assessment? Yes  Visual Fields No apparent deficits  Additional Comments reports she has diabetic retinopathy  Transfers  Overall transfer level Needs assistance  Transfers Sit to/from Stand  Sit to Stand Supervision  OT - End of Session  Equipment Utilized During Treatment Gait belt  Activity Tolerance Patient tolerated treatment well  Patient left in bed;with call bell/phone within reach;with bed alarm set;Other (comment) (sitting EOB)  OT Assessment/Plan  OT Plan Discharge plan remains appropriate  OT Frequency (ACUTE ONLY) Min 2X/week  Follow Up Recommendations No OT follow up;Supervision - Intermittent  OT Equipment 3 in 1 bedside comode  OT Goal Progression  Progress towards OT goals Progressing toward goals  Acute Rehab OT Goals  Patient Stated Goal go home and get left knee better  OT Goal Formulation With patient  Time For Goal Achievement 10/11/15  Potential to Achieve Goals Good  ADL Goals  Pt Will Perform Upper Body Bathing with modified independence;sitting  Pt Will Perform Lower Body Bathing with modified independence;sit to/from stand  Pt Will Transfer to Toilet with modified independence;ambulating;bedside commode  Pt Will Perform Toileting - Clothing Manipulation and hygiene with modified independence;sit to/from stand  Pt Will Perform Tub/Shower Transfer with modified independence;ambulating;3 in 1  Pt/caregiver will Perform Home Exercise Program Increased strength;Both right and left upper extremity;With theraband;Independently;With written HEP provided  Additional ADL Goal #1 Pt will independently verbalize 2 fall prevention strategies for increased safety with ADLs and functional mobility.  Pt Will Perform Upper Body Dressing with modified independence;standing  OT General Charges  $OT Visit 1 Procedure  OT Treatments  $Self Care/Home Management  8-22 mins

## 2015-09-29 NOTE — Evaluation (Signed)
Speech Language Pathology Evaluation Patient Details Name: ZAVIA ROZARIO MRN: [id number] DOB: 1950/08/21 Today's Date: 09/29/2015 Time: [id number] SLP Time Calculation (min) (ACUTE ONLY): 33 min  Problem List:  Patient Active Problem List   Diagnosis Date Noted  . CVA (cerebral infarction) 09/26/2015  . Stroke (cerebrum) ([place]) 09/26/2015  . Chronic kidney disease 10/14/2014  . Left arm pain 10/14/2014  . Low back pain radiating to right lower extremity 02/11/2014  . Cough 04/09/2013  . Vaginal bleeding 01/22/2013  . Background diabetic retinopathy(362.01) 07/25/2012  . HLD (hyperlipidemia) 06/29/2012  . Healthcare maintenance 06/29/2012  . Sarcoidosis of lung ([place]) 09/30/2011  . Hyperlipidemia 08/10/2011  . DM type 2 with diabetic background retinopathy ([place]) 06/26/2008  . Essential hypertension, benign 06/26/2008  . Osteoarthritis of left knee 06/26/2008   Past Medical History:  Past Medical History  Diagnosis Date  . Sarcoidosis ([place])   . Hypertension   . Diabetes mellitus    Past Surgical History:  Past Surgical History  Procedure Laterality Date  . Intraocular lens insertion    . Ep implantable device N/A 09/29/2015    Procedure: Loop Recorder Insertion;  Surgeon: Evans Lance, MD;  Location: [facility];  Service: Cardiovascular;  Laterality: N/A;   HPI:  Pt is a 65 y.o. female with a PMH significant for type 2 diabetes mellitus, hypertension, chronic kidney disease stage III, and hyperlipidemia. Pt presented to the ED with speech difficulty and R sided weakness that began approximately 30 hours prior. Per pt, she had similar symptoms about a month ago which improved but did not resolve. MRI revealed Multiple predominately subcentimeter infarcts LEFT temporal, frontal and parietal lobes and MCA territory; Minimal petechial hemorrhage LEFT parietal lobe.   Assessment / Plan / Recommendation Clinical Impression  Pt has an expressive > receptive aphasia with  word-finding difficulties and phonemic paraphasias at the conversational level. Basic comprehension appears intact, but she requires Mod cues and repetitions for processing multi-step instructions. She requires Mod-Max cues for storage and retreival of new information during structured tasks, but functionally was able to recall information she heard from her doctor with Min cues from family. Recommend 24/7 supervision and [facility] SLP upon discharge.    SLP Assessment  Patient needs continued Speech Lanaguage Pathology Services    Follow Up Recommendations  Home health SLP;24 hour supervision/assistance    Frequency and Duration min 2x/week  2 weeks      SLP Evaluation Prior Functioning  Cognitive/Linguistic Baseline:  (pr reports new difficulties over the last month)  Lives With: Spouse Available Help at Discharge: Family;Available 24 hours/day   Cognition  Overall Cognitive Status: Impaired/Different from baseline Arousal/Alertness: Awake/alert Orientation Level: Oriented X4 Attention: Sustained Sustained Attention: Impaired Sustained Attention Impairment: Verbal basic Memory: Impaired Memory Impairment: Storage deficit;Retrieval deficit;Decreased recall of new information Awareness: Appears intact Problem Solving: Appears intact Safety/Judgment: Appears intact    Comprehension  Auditory Comprehension Overall Auditory Comprehension: Impaired Commands: Impaired One Step Basic Commands: 75-100% accurate Two Step Basic Commands: 75-100% accurate Multistep Basic Commands: 25-49% accurate Conversation: Simple Interfering Components: Attention;Processing speed;Working Field seismologist: Conservation officer, nature: Within [employer] Reading Comprehension Reading Status: Not tested    Expression Expression Primary Mode of Expression: Verbal Verbal Expression Overall Verbal Expression: Impaired Initiation: No  impairment Automatic Speech: Name;Social Response Level of Generative/Spontaneous Verbalization: Sentence Repetition: No impairment Naming: Impairment Confrontation: Within functional limits Divergent: 75-100% accurate Verbal Errors: Phonemic paraphasias;Other (comment) (word finding errors) Effective Techniques: Other (Comment) (extra time)  Non-Verbal Means of Communication: Not applicable   Oral / Motor  Oral Motor/Sensory Function Overall Oral Motor/Sensory Function: Within functional limits (appears WFL during functional tasks) Motor Speech Overall Motor Speech: Appears within functional limits for tasks assessed   GO                   Germain Osgood, M.A. CCC-SLP [phone]  Germain Osgood 09/29/2015, 3:58 PM

## 2015-09-29 NOTE — Care Management Note (Signed)
Case Management Note  Patient Details  Name: Wendy [name] MRN: [id number] Date of Birth: [birth year]  Subjective/Objective:                    Action/Plan: Patient was admitted with CVA. Lives at home with spouse. Will follow for discharge needs pending PT/OT evals and physician orders.  Expected Discharge Date:                  Expected Discharge Plan:     In-House Referral:     Discharge planning Services     Post Acute Care Choice:    Choice offered to:     DME Arranged:    DME Agency:     HH Arranged:    HH Agency:     Status of Service:  In process, will continue to follow  Medicare Important Message Given:    Date Medicare IM Given:    Medicare IM give by:    Date Additional Medicare IM Given:    Additional Medicare Important Message give by:     If discussed at [place] of Stay Meetings, dates discussed:    Additional Comments:  Rolm Baptise, RN 09/29/2015, 10:48 AM [phone]

## 2015-09-29 NOTE — Progress Notes (Signed)
STROKE TEAM PROGRESS NOTE   HISTORY OF PRESENT ILLNESS Wendy [name] is an 65 y.o. female history diabetes mellitus, hypertension, lipidemia who presented following onset of speech difficulty and right-sided weakness on the previous afternoon. She was last known well at 4 PM on 09/25/2015. She had a similar change in speech about one month earlier which apparently resolved, and for which she did not seek medical attention. CT scan of her head showed lacunar infarction in the left putamen thought to be subacute. MRI is pending. NIH stroke score was 3. She has not been on antiplatelet therapy and has been started on aspirin.  LSN: 4:00 PM on 09/25/2015 tPA Given: No: Beyond time window for treatment consideration. mRankin:    SUBJECTIVE (INTERVAL HISTORY) Patient states speech difficulty is improved and mild right-sided weakness persists. She is scheduled to undergo TEE and loop recorder later today    OBJECTIVE Temp:  [97.5 F (36.4 C)-98.8 F (37.1 C)] 98.1 F (36.7 C) (04/17 1221) Pulse Rate:  [70-86] 77 (04/17 1330) Cardiac Rhythm:  [-] Normal sinus rhythm (04/17 1110) Resp:  [14-23] 21 (04/17 1330) BP: (141-259)/(54-133) 177/54 mmHg (04/17 1330) SpO2:  [95 %-100 %] 100 % (04/17 1330)  CBC:  Recent Labs Lab 09/26/15 2159  09/28/15 0227 09/29/15 0706  WBC 6.4  --  6.3 6.2  NEUTROABS 3.5  --   --   --   HGB 12.4  < > 10.9* 11.4*  HCT 37.2  < > 34.6* 34.9*  MCV 80.3  --  81.4 81.9  PLT 233  --  211 217  < > = values in this interval not displayed.  Basic Metabolic Panel:   Recent Labs Lab 09/28/15 0227 09/29/15 0706  NA 138 141  K 4.3 4.4  CL 104 108  CO2 24 23  GLUCOSE 148* 154*  BUN 33* 28*  CREATININE 1.56* 1.41*  CALCIUM 9.2 9.2    Lipid Panel:     Component Value Date/Time   CHOL 240* 09/23/2014 1423   TRIG 89 09/23/2014 1423   HDL 59 09/23/2014 1423   CHOLHDL 4.1 09/23/2014 1423   VLDL 18 09/23/2014 1423   LDLCALC 163* 09/23/2014 1423    HgbA1c:  Lab Results  Component Value Date   HGBA1C 8.0 07/28/2015   Urine Drug Screen:     Component Value Date/Time   LABOPIA NONE DETECTED 09/26/2015 2353   COCAINSCRNUR NONE DETECTED 09/26/2015 2353   LABBENZ NONE DETECTED 09/26/2015 2353   AMPHETMU NONE DETECTED 09/26/2015 2353   THCU NONE DETECTED 09/26/2015 2353   LABBARB NONE DETECTED 09/26/2015 2353      IMAGING  Dg Chest 2 View 09/27/2015   Mild chronic appearing interstitial coarsening. No acute cardiopulmonary findings.     Ct Head Wo Contrast 09/26/2015   Lacunar infarction in the left putamen, probably subacute. No hemorrhage.    Mr Jodene Nam Head/brain Wo Cm 09/27/2015    MRI HEAD:  Multiple predominately subcentimeter infarcts LEFT temporal, frontal and parietal lobes and MCA territory. Minimal petechial hemorrhage LEFT parietal lobe. Moderate white matter changes compatible with chronic small vessel ischemic disease.   MRA HEAD:  LEFT distal M1 emergent large vessel occlusion. Thready flow related enhancement of LEFT M2 compatible with early collateral formation. Mild stenosis RIGHT para ophthalmic internal carotid artery.       PHYSICAL EXAM  Pleasant middle aged lady not in distress. Afebrile. Head is nontraumatic. Neck is supple without bruit.    Cardiac exam no murmur or gallop.  Lungs are clear to auscultation. Distal pulses are well felt. Neurological Exam :  Awake alert oriented x 3 normal speech and language. Mild left lower face asymmetry. Tongue midline. No drift. Mild diminished fine finger movements on left. Orbits right over left upper extremity. Mild left grip weak.. Normal sensation . Normal coordination.    ASSESSMENT/PLAN Ms. Wendy [name] is a 65 y.o. female with history of diabetes mellitus, hypertension, chronic kidney disease, sarcoidosis, and hyperlipidemia presenting with speech difficulties and right hemiparesis.  She did not receive IV t-PA due to late presentation.  Strokes:   Dominant embolic from an unknown source  Resultant    MRI  Multiple predominately subcentimeter infarcts LEFT temporal, frontal and parietal lobes and MCA territory  MRA - LEFT distal M1 emergent large vessel occlusion.  Carotid Doppler - Pending  2D Echo - EF 60-65%. No cardiac source of emboli identified.  LDL - 163  HgbA1c pending  VTE prophylaxis - Lovenox Diet Carb Modified Fluid consistency:: Thin; Room service appropriate?: Yes  No antithrombotic prior to admission, now on aspirin 325 mg daily  Patient counseled to be compliant with her antithrombotic medications  Ongoing aggressive stroke risk factor management  Therapy recommendations: Pending  Disposition: Pending  Hypertension  Stable  Permissive hypertension (OK if < 220/120) but gradually normalize in 5-7 days  Hyperlipidemia  Home meds:  No lipid lowering medications prior to admission.  LDL 163, goal < 70  Now on Lipitor 40 mg daily   Continue statin at discharge  Diabetes  HgbA1c pending, goal < 7.0  Uncontrolled  Other Stroke Risk Factors  Advanced age  Cigarette smoker, quit smoking 40 years ago.  Obesity, Body mass index is 39.46 kg/(m^2).   Hx of TIA   Other Active Problems  Chronic kidney disease - BUN 33 creatinine 1.5  History sarcoidosis        Hospital day # 3  Plan TEE and loop recorder today 09/29/2015, 3:06 PM   Fred Franzen    To contact Stroke Continuity provider, please refer to [email or web address] After hours, contact General Neurology

## 2015-09-29 NOTE — Interval H&P Note (Signed)
History and Physical Interval Note:  09/29/2015 11:48 AM  Wendy [name]  has presented today for surgery, with the diagnosis of STROKE  The various methods of treatment have been discussed with the patient and family. After consideration of risks, benefits and other options for treatment, the patient has consented to  Procedure(s): TRANSESOPHAGEAL ECHOCARDIOGRAM (TEE) (N/A) as a surgical intervention .  The patient's history has been reviewed, patient examined, no change in status, stable for surgery.  I have reviewed the patient's chart and labs.  Questions were answered to the patient's satisfaction.     Canda Podgorski [employer]

## 2015-09-29 NOTE — Progress Notes (Signed)
*  PRELIMINARY RESULTS* Echocardiogram Echocardiogram Transesophageal has been performed.  Leavy Cella 09/29/2015, 1:15 PM

## 2015-09-29 NOTE — Progress Notes (Signed)
VASCULAR LAB PRELIMINARY  PRELIMINARY  PRELIMINARY  PRELIMINARY  Carotid duplex completed.    Preliminary report:  1-39% ICA plaquing.  Vertebral artery flow is antegrade.   Emmerson Taddei, RVT 09/29/2015, 8:32 AM

## 2015-09-29 NOTE — Progress Notes (Signed)
[facility]  Patient Status: Active (receiving services up to time of hospitalization)  [facility] is providing the following services: RN, PT and OT  If patient discharges after hours, please call [phone].   Wendy [name] 09/29/2015, 7:40 PM

## 2015-09-29 NOTE — Consult Note (Signed)
ELECTROPHYSIOLOGY CONSULT NOTE  Patient ID: Wendy [name] MRN: [id number], DOB/AGE: [birth year]   Admit date: 09/26/2015 Date of Consult: 09/29/2015  Primary Physician: Dellia Nims, MD Primary Cardiologist: None Reason for Consultation: Cryptogenic stroke ; recommendations regarding Implantable Loop Recorder  History of Present Illness Wendy [name] was admitted on 09/26/2015 with speech difficulty and right-sided weakness.  They first developed symptoms about 1 month prior but did not seek medical attention.  Imaging demonstrated: CT scan of her head showed lacunar infarction in the left putamen thought to be subacute. MRI showed multiple predominately subcentimeter infarcts LEFT temporal, frontal and parietal lobes and MCA territory. she has undergone workup for stroke including echocardiogram and carotid dopplers.  The patient has been monitored on telemetry which has demonstrated sinus rhythm with no arrhythmias.  Inpatient stroke work-up is to be completed with a TEE.   Echocardiogram this admission demonstrated EF 60-65% with no RWMA and G2DD.  Lab work is reviewed.  Prior to admission, the patient denies chest pain, shortness of breath, dizziness, palpitations, or syncope.  They are recovering from their stroke with plans to go home with home health at discharge.  EP has been asked to evaluate for placement of an implantable loop recorder to monitor for atrial fibrillation.     Past Medical History  Diagnosis Date  . Sarcoidosis ([place])   . Hypertension   . Diabetes mellitus      Surgical History:  Past Surgical History  Procedure Laterality Date  . Intraocular lens insertion       Prescriptions prior to admission  Medication Sig Dispense Refill Last Dose  . glipiZIDE (GLUCOTROL) 5 MG tablet Take 1 tablet (5 mg total) by mouth 2 (two) times daily before a meal. 180 tablet 1 09/25/2015 at Unknown time  . hydrochlorothiazide (HYDRODIURIL) 25 MG tablet Take 1 tablet (25  mg total) by mouth daily. 30 tablet 3 09/25/2015 at Unknown time  . Nutritional Supplements (NUTRITIONAL SUPPLEMENT PO) Take 1 tablet by mouth daily. High Blood Sugar.   09/25/2015 at Unknown time  . Disposable Gloves (LATEX EXAM GLOVES) MISC Use gloves when applying capsaicin cream 100 each 0   . glucose blood test strip Check blood sugar one time a day 50 each 12   . Lancet Device MISC For True Track meter - use to check blood sugars once a week. Dx code: 250.00. 100 each 11 Taking  . Lancets (ACCU-CHEK MULTICLIX) lancets Use as instructed 100 each 12 Taking    Inpatient Medications:  . aspirin  325 mg Oral Daily  . atorvastatin  40 mg Oral q1800  . enoxaparin (LOVENOX) injection  40 mg Subcutaneous Q24H  . insulin aspart  0-15 Units Subcutaneous TID WC    Allergies:  Allergies  Allergen Reactions  . Metformin And Related Nausea Only    "severe itching"    Social History   Social History  . Marital Status: Married    Spouse Name: N/A  . Number of Children: N/A  . Years of Education: N/A   Occupational History  . Not on file.   Social History Main Topics  . Smoking status: Former Smoker    Quit date: 08/09/1976  . Smokeless tobacco: Not on file  . Alcohol Use: No  . Drug Use: No  . Sexual Activity: Not on file   Other Topics Concern  . Not on file   Social History Narrative     History reviewed. No pertinent family history.  Review of Systems: General: No chills, fever, night sweats or weight changes  Cardiovascular:  No chest pain, dyspnea on exertion, edema, orthopnea, palpitations, paroxysmal nocturnal dyspnea Dermatological: No rash, lesions or masses Respiratory: No cough, dyspnea Urologic: No hematuria, dysuria Abdominal: No nausea, vomiting, diarrhea, bright red blood per rectum, melena, or hematemesis Neurologic: No visual changes, weakness, changes in mental status All other systems reviewed and are otherwise negative except as noted  above.  Physical Exam: Filed Vitals:   09/28/15 2217 09/29/15 0244 09/29/15 0436 09/29/15 0900  BP: 168/69 153/71 165/66   Pulse: 76 83 77   Temp: 98.8 F (37.1 C) 97.9 F (36.6 C) 97.5 F (36.4 C)   TempSrc: Oral Oral Oral   Resp: 18 18 18    Height:      Weight:      SpO2: 100% 99% 95% 97%    GEN- The patient is well appearing, alert and oriented x 3 today.  Obese.  Head- normocephalic, atraumatic Eyes-  Sclera clear, conjunctiva pink Ears- hearing intact Oropharynx- clear Neck- supple Lungs- Clear to ausculation bilaterally, normal work of breathing Heart- Regular rate and rhythm, no murmurs, rubs or gallops  GI- soft, NT, ND, + BS Extremities- no clubbing, cyanosis, or edema MS- no significant deformity or atrophy Skin- no rash or lesion Psych- euthymic mood, full affect   Labs:   Lipid Panel     Component Value Date/Time   CHOL 240* 09/23/2014 1423   TRIG 89 09/23/2014 1423   HDL 59 09/23/2014 1423   CHOLHDL 4.1 09/23/2014 1423   VLDL 18 09/23/2014 1423   LDLCALC 163* 09/23/2014 1423      Lab Results  Component Value Date   WBC 6.2 09/29/2015   HGB 11.4* 09/29/2015   HCT 34.9* 09/29/2015   MCV 81.9 09/29/2015   PLT 217 09/29/2015    Recent Labs Lab 09/26/15 2159  09/29/15 0706  NA 141  < > 141  K 4.3  < > 4.4  CL 103  < > 108  CO2 27  < > 23  BUN 34*  < > 28*  CREATININE 1.46*  < > 1.41*  CALCIUM 9.5  < > 9.2  PROT 8.1  --   --   BILITOT 0.4  --   --   ALKPHOS 79  --   --   ALT 14  --   --   AST 19  --   --   GLUCOSE 154*  < > 154*  < > = values in this interval not displayed. No results found for: CKTOTAL, CKMB, CKMBINDEX, TROPONINI    Radiology/Studies: Dg Chest 2 View  09/27/2015  CLINICAL DATA:  Slurred speech, confusion and right-sided weakness. Onset yesterday afternoon. EXAM: CHEST  2 VIEW COMPARISON:  04/09/2013 FINDINGS: There is mild chronic appearing interstitial coarsening it. This may be related to the described history of  sarcoidosis. No alveolar opacities. No effusions. Normal hilar, mediastinal and cardiac contours. IMPRESSION: Mild chronic appearing interstitial coarsening. No acute cardiopulmonary findings. Electronically Signed   By: Andreas Newport M.D.   On: 09/27/2015 00:35   Ct Head Wo Contrast  09/26/2015  CLINICAL DATA:  Slurred speech and confusion. Onset yesterday afternoon. EXAM: CT HEAD WITHOUT CONTRAST TECHNIQUE: Contiguous axial images were obtained from the base of the skull through the vertex without intravenous contrast. COMPARISON:  None. FINDINGS: There is no intracranial hemorrhage. There is no extra-axial fluid collection. There is lacunar infarction in the left putamen, probably subacute. Slight  generalized atrophy. Ventricles and basal cisterns are unremarkable. No significant bony abnormality. Visible paranasal sinuses are clear. Orbits are unremarkable. IMPRESSION: Lacunar infarction in the left putamen, probably subacute. No hemorrhage. Electronically Signed   By: Andreas Newport M.D.   On: 09/26/2015 22:49   Mr Brain Wo Contrast  09/27/2015  CLINICAL DATA:  Mild dysarthria and expressive aphasia, RIGHT grip weakness. Symptoms for 30 hours. History of hypertension, diabetes and sarcoidosis. EXAM: MRI HEAD WITHOUT CONTRAST MRA HEAD WITHOUT CONTRAST TECHNIQUE: Multiplanar, multiecho pulse sequences of the brain and surrounding structures were obtained without intravenous contrast. Angiographic images of the head were obtained using MRA technique without contrast. COMPARISON:  CT head August 26, 2015 FINDINGS: MRI HEAD FINDINGS INTRACRANIAL CONTENTS: Patchy reduced diffusion LEFT parietal lobe cortex and subcortical white matter. Diffuse subcentimeter foci of reduced diffusion LEFT temporal lobe and punctate focus of reduced diffusion LEFT posterior frontal lobe. Lesions show low ADC values and FLAIR T2 hyperintense signal. Subcentimeter focus of susceptibility artifact LEFT parietal gyrus. The  ventricles and sulci are normal for patient's age. No suspicious parenchymal signal, mass lesions, mass effect. Patchy supratentorial white matter FLAIR T2 hyperintensities. LEFT inferior basal ganglia old perivascular space , minimal surrounding FLAIR T2 hyperintense suggests adjacent lacunar infarct. Ventricles and sulci are normal for patient's age. No abnormal extra-axial fluid collections. No extra-axial masses though, contrast enhanced sequences would be more sensitive. ORBITS: The included ocular globes and orbital contents are non-suspicious. SINUSES: Trace paranasal sinus mucosal thickening without air-fluid levels. Trace mastoid effusions. SKULL/SOFT TISSUES: No abnormal sellar expansion. No suspicious calvarial bone marrow signal. Craniocervical junction maintained. MRA HEAD FINDINGS Anterior circulation: Normal flow related enhancement of the included cervical, petrous, cavernous and supraclinoid internal carotid arteries. Mild stenosis RIGHT para ophthalmic internal carotid artery, corresponding to calcific atherosclerosis on yesterday's head CT. Patent anterior communicating artery. Complete loss of LEFT distal M1 flow related enhancement at trifurcation. Thready flow related enhancement of the proximal M2 segments. No high-grade stenosis, abnormal luminal irregularity, aneurysm. Posterior circulation: Codominant vertebral artery's. Basilar artery is patent, with normal flow related enhancement of the main branch vessels. Normal flow related enhancement of the posterior cerebral arteries. No large vessel occlusion, high-grade stenosis, abnormal luminal irregularity, aneurysm. IMPRESSION: MRI HEAD: Multiple predominately subcentimeter infarcts LEFT temporal, frontal and parietal lobes and MCA territory. Minimal petechial hemorrhage LEFT parietal lobe. Moderate white matter changes compatible with chronic small vessel ischemic disease. MRA HEAD: LEFT distal M1 emergent large vessel occlusion. Thready  flow related enhancement of LEFT M2 compatible with early collateral formation. Mild stenosis RIGHT para ophthalmic internal carotid artery. These results will be called to the ordering clinician or representative by the Radiologist Assistant, and communication documented in the PACS or zVision Dashboard. Electronically Signed   By: Elon Alas M.D.   On: 09/27/2015 05:32   Mr Jodene Nam Head/brain Wo Cm  09/27/2015  CLINICAL DATA:  Mild dysarthria and expressive aphasia, RIGHT grip weakness. Symptoms for 30 hours. History of hypertension, diabetes and sarcoidosis. EXAM: MRI HEAD WITHOUT CONTRAST MRA HEAD WITHOUT CONTRAST TECHNIQUE: Multiplanar, multiecho pulse sequences of the brain and surrounding structures were obtained without intravenous contrast. Angiographic images of the head were obtained using MRA technique without contrast. COMPARISON:  CT head August 26, 2015 FINDINGS: MRI HEAD FINDINGS INTRACRANIAL CONTENTS: Patchy reduced diffusion LEFT parietal lobe cortex and subcortical white matter. Diffuse subcentimeter foci of reduced diffusion LEFT temporal lobe and punctate focus of reduced diffusion LEFT posterior frontal lobe. Lesions show low ADC values and FLAIR  T2 hyperintense signal. Subcentimeter focus of susceptibility artifact LEFT parietal gyrus. The ventricles and sulci are normal for patient's age. No suspicious parenchymal signal, mass lesions, mass effect. Patchy supratentorial white matter FLAIR T2 hyperintensities. LEFT inferior basal ganglia old perivascular space , minimal surrounding FLAIR T2 hyperintense suggests adjacent lacunar infarct. Ventricles and sulci are normal for patient's age. No abnormal extra-axial fluid collections. No extra-axial masses though, contrast enhanced sequences would be more sensitive. ORBITS: The included ocular globes and orbital contents are non-suspicious. SINUSES: Trace paranasal sinus mucosal thickening without air-fluid levels. Trace mastoid effusions.  SKULL/SOFT TISSUES: No abnormal sellar expansion. No suspicious calvarial bone marrow signal. Craniocervical junction maintained. MRA HEAD FINDINGS Anterior circulation: Normal flow related enhancement of the included cervical, petrous, cavernous and supraclinoid internal carotid arteries. Mild stenosis RIGHT para ophthalmic internal carotid artery, corresponding to calcific atherosclerosis on yesterday's head CT. Patent anterior communicating artery. Complete loss of LEFT distal M1 flow related enhancement at trifurcation. Thready flow related enhancement of the proximal M2 segments. No high-grade stenosis, abnormal luminal irregularity, aneurysm. Posterior circulation: Codominant vertebral artery's. Basilar artery is patent, with normal flow related enhancement of the main branch vessels. Normal flow related enhancement of the posterior cerebral arteries. No large vessel occlusion, high-grade stenosis, abnormal luminal irregularity, aneurysm. IMPRESSION: MRI HEAD: Multiple predominately subcentimeter infarcts LEFT temporal, frontal and parietal lobes and MCA territory. Minimal petechial hemorrhage LEFT parietal lobe. Moderate white matter changes compatible with chronic small vessel ischemic disease. MRA HEAD: LEFT distal M1 emergent large vessel occlusion. Thready flow related enhancement of LEFT M2 compatible with early collateral formation. Mild stenosis RIGHT para ophthalmic internal carotid artery. These results will be called to the ordering clinician or representative by the Radiologist Assistant, and communication documented in the PACS or zVision Dashboard. Electronically Signed   By: Elon Alas M.D.   On: 09/27/2015 05:32    12-lead ECG  All prior EKG's in EPIC reviewed with no documented atrial fibrillation  Telemetry with no afib. Sinus tach with PVCs  Assessment and Plan:  1. Cryptogenic stroke The patient presents with cryptogenic stroke.  The patient has a TEE planned for this AM.   I spoke at length with the patient about monitoring for afib with either a 30 day event monitor or an implantable loop recorder.  Risks, benefits, and alteratives to implantable loop recorder were discussed with the patient today.   At this time, the patient is very clear in their decision to proceed with implantable loop recorder.   Wound care was reviewed with the patient (keep incision clean and dry for 3 days).  Wound check scheduled for 10/09/15 at 11:30am.   Please call with questions.   Crista Luria 09/29/2015  [id number]  EP Attending  Patient seen and examined. See my note as well.  Mikle Bosworth.D.

## 2015-09-29 NOTE — CV Procedure (Signed)
Procedure: TEE  Indication: CVA  Sedation: Versed 5 mg IV, Fentanyl 50 mcg IV  Findings: Please see echo section for full report.  Normal LV size with mild LV hypertrophy.  EF 55-60%.  Normal RV size and systolic function.  Trivial TR.  Mild MR.  Trileaflet aortic valve without significant stenosis or regurgitation.  Normal right and left atrial sizes.  No LA appendage thrombus.  A few bubbles crossed on bubble study, suggesting small PFO.  I was unable to visualize the actual crossing.  Normal caliber aorta with mild plaque in descending thoracic aorta.   Suspected small PFO is the only the potential source of embolus.  Loralie Champagne 09/29/2015 12:15 PM

## 2015-09-29 NOTE — Care Management Important Message (Signed)
Important Message  Patient Details  Name: Wendy [name] MRN: [id number] Date of Birth: [birth year]   Medicare Important Message Given:  Yes    Kyndal Heringer P [place] 09/29/2015, 1:56 PM

## 2015-09-29 NOTE — Discharge Instructions (Signed)
Stroke Prevention Some medical conditions and behaviors are associated with an increased chance of having a stroke. You may prevent a stroke by making healthy choices and managing medical conditions. HOW CAN I REDUCE MY RISK OF HAVING A STROKE?   Stay physically active. Get at least 30 minutes of activity on most or all days.  Do not smoke. It may also be helpful to avoid exposure to secondhand smoke.  Limit alcohol use. Moderate alcohol use is considered to be:  No more than 2 drinks per day for men.  No more than 1 drink per day for nonpregnant women.  Eat healthy foods. This involves:  Eating 5 or more servings of fruits and vegetables a day.  Making dietary changes that address high blood pressure (hypertension), high cholesterol, diabetes, or obesity.  Manage your cholesterol levels.  Making food choices that are high in fiber and low in saturated fat, trans fat, and cholesterol may control cholesterol levels.  Take any prescribed medicines to control cholesterol as directed by your health care provider.  Manage your diabetes.  Controlling your carbohydrate and sugar intake is recommended to manage diabetes.  Take any prescribed medicines to control diabetes as directed by your health care provider.  Control your hypertension.  Making food choices that are low in salt (sodium), saturated fat, trans fat, and cholesterol is recommended to manage hypertension.  Ask your health care provider if you need treatment to lower your blood pressure. Take any prescribed medicines to control hypertension as directed by your health care provider.  If you are 18-39 years of age, have your blood pressure checked every 3-5 years. If you are 40 years of age or older, have your blood pressure checked every year.  Maintain a healthy weight.  Reducing calorie intake and making food choices that are low in sodium, saturated fat, trans fat, and cholesterol are recommended to manage  weight.  Stop drug abuse.  Avoid taking birth control pills.  Talk to your health care provider about the risks of taking birth control pills if you are over 35 years old, smoke, get migraines, or have ever had a blood clot.  Get evaluated for sleep disorders (sleep apnea).  Talk to your health care provider about getting a sleep evaluation if you snore a lot or have excessive sleepiness.  Take medicines only as directed by your health care provider.  For some people, aspirin or blood thinners (anticoagulants) are helpful in reducing the risk of forming abnormal blood clots that can lead to stroke. If you have the irregular heart rhythm of atrial fibrillation, you should be on a blood thinner unless there is a good reason you cannot take them.  Understand all your medicine instructions.  Make sure that other conditions (such as anemia or atherosclerosis) are addressed. SEEK IMMEDIATE MEDICAL CARE IF:   You have sudden weakness or numbness of the face, arm, or leg, especially on one side of the body.  Your face or eyelid droops to one side.  You have sudden confusion.  You have trouble speaking (aphasia) or understanding.  You have sudden trouble seeing in one or both eyes.  You have sudden trouble walking.  You have dizziness.  You have a loss of balance or coordination.  You have a sudden, severe headache with no known cause.  You have new chest pain or an irregular heartbeat. Any of these symptoms may represent a serious problem that is an emergency. Do not wait to see if the symptoms will   go away. Get medical help at once. Call your local emergency services (911 in [place] Do not drive yourself to the hospital.   This information is not intended to replace advice given to you by your health care provider. Make sure you discuss any questions you have with your health care provider.   Document Released: 07/08/2004 Document Revised: 06/21/2014 Document Reviewed:  12/01/2012 Elsevier Interactive Patient Education 2016 Elsevier Inc[employer]  

## 2015-09-30 ENCOUNTER — Inpatient Hospital Stay (HOSPITAL_COMMUNITY): Payer: Medicare Other

## 2015-09-30 DIAGNOSIS — I639 Cerebral infarction, unspecified: Secondary | ICD-10-CM

## 2015-09-30 DIAGNOSIS — I638 Other cerebral infarction: Secondary | ICD-10-CM

## 2015-09-30 LAB — GLUCOSE, CAPILLARY: Glucose-Capillary: 134 mg/dL — ABNORMAL HIGH (ref 65–99)

## 2015-09-30 LAB — HEMOGLOBIN A1C
Hgb A1c MFr Bld: 8.9 % — ABNORMAL HIGH (ref 4.8–5.6)
Mean Plasma Glucose: 209 mg/dL

## 2015-09-30 NOTE — Progress Notes (Signed)
VASCULAR LAB PRELIMINARY  PRELIMINARY  PRELIMINARY  PRELIMINARY  Bilateral:  No evidence of DVT, superficial thrombosis, or Baker's Cyst.   Bilateral lower extremity venous duplex   Shazia Mitchener, RVT, RDMS 09/30/2015, 8:47 AM

## 2015-09-30 NOTE — Progress Notes (Signed)
D/C orders received, pt for D/C home today.  IV and telemetry D/C.  Rx and D/C instructions given with verbalized understanding.  Family at bedside to assist with D/C.  Staff brought pt downstairs via wheelchair.  

## 2015-09-30 NOTE — Progress Notes (Signed)
Pt seen and examined at bedside, prelim report negative for DVT. Neuro team has cleared pt for discharge. Pt asked me to review side effects of eye drops. Pt wanted to know if eye drop are possible cause of her stroke. Reviewed side effects. Pt to further discuss with her ophthalmologist. Please see d/c summary from 09/29/2015. No changes needed.   Faye Ramsay, MD  [facility] Pager [phone]  If 7PM-7AM, please contact night-coverage [email or web address] Password [email or web address]

## 2015-09-30 NOTE — Progress Notes (Signed)
Patient has requested to not be disturbed tonight, she only wants to sleep; upon midnight assessment, patient was educated not to lay on her affected side due to her loop recorder placement.  Will hold 2am vitals and reenter patients room at 6am for vital checks; patient is signed off as independent and husband was at bedside.

## 2015-10-01 ENCOUNTER — Encounter (HOSPITAL_COMMUNITY): Payer: Self-pay | Admitting: Cardiology

## 2015-10-01 DIAGNOSIS — E1122 Type 2 diabetes mellitus with diabetic chronic kidney disease: Secondary | ICD-10-CM | POA: Diagnosis not present

## 2015-10-01 DIAGNOSIS — E785 Hyperlipidemia, unspecified: Secondary | ICD-10-CM | POA: Diagnosis not present

## 2015-10-01 DIAGNOSIS — I69351 Hemiplegia and hemiparesis following cerebral infarction affecting right dominant side: Secondary | ICD-10-CM | POA: Diagnosis not present

## 2015-10-01 DIAGNOSIS — N183 Chronic kidney disease, stage 3 (moderate): Secondary | ICD-10-CM | POA: Diagnosis not present

## 2015-10-01 DIAGNOSIS — I10 Essential (primary) hypertension: Secondary | ICD-10-CM | POA: Diagnosis not present

## 2015-10-01 DIAGNOSIS — E13319 Other specified diabetes mellitus with unspecified diabetic retinopathy without macular edema: Secondary | ICD-10-CM | POA: Diagnosis not present

## 2015-10-01 DIAGNOSIS — I69318 Other symptoms and signs involving cognitive functions following cerebral infarction: Secondary | ICD-10-CM | POA: Diagnosis not present

## 2015-10-03 DIAGNOSIS — I69351 Hemiplegia and hemiparesis following cerebral infarction affecting right dominant side: Secondary | ICD-10-CM | POA: Diagnosis not present

## 2015-10-03 DIAGNOSIS — I69318 Other symptoms and signs involving cognitive functions following cerebral infarction: Secondary | ICD-10-CM | POA: Diagnosis not present

## 2015-10-03 DIAGNOSIS — E1122 Type 2 diabetes mellitus with diabetic chronic kidney disease: Secondary | ICD-10-CM | POA: Diagnosis not present

## 2015-10-03 DIAGNOSIS — N183 Chronic kidney disease, stage 3 (moderate): Secondary | ICD-10-CM | POA: Diagnosis not present

## 2015-10-03 DIAGNOSIS — E785 Hyperlipidemia, unspecified: Secondary | ICD-10-CM | POA: Diagnosis not present

## 2015-10-03 DIAGNOSIS — E13319 Other specified diabetes mellitus with unspecified diabetic retinopathy without macular edema: Secondary | ICD-10-CM | POA: Diagnosis not present

## 2015-10-06 DIAGNOSIS — I69351 Hemiplegia and hemiparesis following cerebral infarction affecting right dominant side: Secondary | ICD-10-CM | POA: Diagnosis not present

## 2015-10-06 DIAGNOSIS — I69318 Other symptoms and signs involving cognitive functions following cerebral infarction: Secondary | ICD-10-CM | POA: Diagnosis not present

## 2015-10-06 DIAGNOSIS — E1122 Type 2 diabetes mellitus with diabetic chronic kidney disease: Secondary | ICD-10-CM | POA: Diagnosis not present

## 2015-10-06 DIAGNOSIS — E785 Hyperlipidemia, unspecified: Secondary | ICD-10-CM | POA: Diagnosis not present

## 2015-10-06 DIAGNOSIS — E13319 Other specified diabetes mellitus with unspecified diabetic retinopathy without macular edema: Secondary | ICD-10-CM | POA: Diagnosis not present

## 2015-10-06 DIAGNOSIS — N183 Chronic kidney disease, stage 3 (moderate): Secondary | ICD-10-CM | POA: Diagnosis not present

## 2015-10-09 ENCOUNTER — Encounter: Payer: Self-pay | Admitting: Internal Medicine

## 2015-10-09 ENCOUNTER — Ambulatory Visit (INDEPENDENT_AMBULATORY_CARE_PROVIDER_SITE_OTHER): Payer: Medicare Other | Admitting: *Deleted

## 2015-10-09 DIAGNOSIS — Z95818 Presence of other cardiac implants and grafts: Secondary | ICD-10-CM

## 2015-10-09 NOTE — Progress Notes (Signed)
Wound check appointment. Dressing removed. Wound without redness or edema. Incision edges approximated, wound well healed. Pt education given regarding wound care and carelink monitoring.  Loop check in clinic. Battery status: ok. R-waves 0.56mV. 0 symptom episodes, 0 tachy episodes, 0 pause episodes, 0 brady episodes. 0 AF episodes . Monthly summary reports and ROV with GT PRN

## 2015-10-10 DIAGNOSIS — I69351 Hemiplegia and hemiparesis following cerebral infarction affecting right dominant side: Secondary | ICD-10-CM | POA: Diagnosis not present

## 2015-10-10 DIAGNOSIS — E1122 Type 2 diabetes mellitus with diabetic chronic kidney disease: Secondary | ICD-10-CM | POA: Diagnosis not present

## 2015-10-10 DIAGNOSIS — E785 Hyperlipidemia, unspecified: Secondary | ICD-10-CM | POA: Diagnosis not present

## 2015-10-10 DIAGNOSIS — I69318 Other symptoms and signs involving cognitive functions following cerebral infarction: Secondary | ICD-10-CM | POA: Diagnosis not present

## 2015-10-10 DIAGNOSIS — N183 Chronic kidney disease, stage 3 (moderate): Secondary | ICD-10-CM | POA: Diagnosis not present

## 2015-10-10 DIAGNOSIS — E13319 Other specified diabetes mellitus with unspecified diabetic retinopathy without macular edema: Secondary | ICD-10-CM | POA: Diagnosis not present

## 2015-10-10 NOTE — Telephone Encounter (Signed)
Call to patient to check on compliance with medications.  Patient stated she is taking her Glucotrol and blood pressure medications.  Patient recently had a Cardiac procedure is doing well.  Also mentioned that her husband is in the hospital and will be going to rehab soon.  Informed that I will be calling her monthly to check on her progress.  Had no further concerns at this time and thanked me for calling to check on her.  Sander Nephew, RN 10/10/2015 2:29 PM

## 2015-10-13 DIAGNOSIS — I69318 Other symptoms and signs involving cognitive functions following cerebral infarction: Secondary | ICD-10-CM | POA: Diagnosis not present

## 2015-10-13 DIAGNOSIS — E1122 Type 2 diabetes mellitus with diabetic chronic kidney disease: Secondary | ICD-10-CM | POA: Diagnosis not present

## 2015-10-13 DIAGNOSIS — E785 Hyperlipidemia, unspecified: Secondary | ICD-10-CM | POA: Diagnosis not present

## 2015-10-13 DIAGNOSIS — N183 Chronic kidney disease, stage 3 (moderate): Secondary | ICD-10-CM | POA: Diagnosis not present

## 2015-10-13 DIAGNOSIS — I69351 Hemiplegia and hemiparesis following cerebral infarction affecting right dominant side: Secondary | ICD-10-CM | POA: Diagnosis not present

## 2015-10-13 DIAGNOSIS — E13319 Other specified diabetes mellitus with unspecified diabetic retinopathy without macular edema: Secondary | ICD-10-CM | POA: Diagnosis not present

## 2015-10-15 DIAGNOSIS — E785 Hyperlipidemia, unspecified: Secondary | ICD-10-CM | POA: Diagnosis not present

## 2015-10-15 DIAGNOSIS — E1122 Type 2 diabetes mellitus with diabetic chronic kidney disease: Secondary | ICD-10-CM | POA: Diagnosis not present

## 2015-10-15 DIAGNOSIS — I69318 Other symptoms and signs involving cognitive functions following cerebral infarction: Secondary | ICD-10-CM | POA: Diagnosis not present

## 2015-10-15 DIAGNOSIS — I69351 Hemiplegia and hemiparesis following cerebral infarction affecting right dominant side: Secondary | ICD-10-CM | POA: Diagnosis not present

## 2015-10-15 DIAGNOSIS — E13319 Other specified diabetes mellitus with unspecified diabetic retinopathy without macular edema: Secondary | ICD-10-CM | POA: Diagnosis not present

## 2015-10-15 DIAGNOSIS — N183 Chronic kidney disease, stage 3 (moderate): Secondary | ICD-10-CM | POA: Diagnosis not present

## 2015-10-20 DIAGNOSIS — N183 Chronic kidney disease, stage 3 (moderate): Secondary | ICD-10-CM | POA: Diagnosis not present

## 2015-10-20 DIAGNOSIS — I69351 Hemiplegia and hemiparesis following cerebral infarction affecting right dominant side: Secondary | ICD-10-CM | POA: Diagnosis not present

## 2015-10-20 DIAGNOSIS — E785 Hyperlipidemia, unspecified: Secondary | ICD-10-CM | POA: Diagnosis not present

## 2015-10-20 DIAGNOSIS — E1122 Type 2 diabetes mellitus with diabetic chronic kidney disease: Secondary | ICD-10-CM | POA: Diagnosis not present

## 2015-10-20 DIAGNOSIS — I69318 Other symptoms and signs involving cognitive functions following cerebral infarction: Secondary | ICD-10-CM | POA: Diagnosis not present

## 2015-10-20 DIAGNOSIS — E13319 Other specified diabetes mellitus with unspecified diabetic retinopathy without macular edema: Secondary | ICD-10-CM | POA: Diagnosis not present

## 2015-10-21 DIAGNOSIS — H35033 Hypertensive retinopathy, bilateral: Secondary | ICD-10-CM | POA: Diagnosis not present

## 2015-10-21 DIAGNOSIS — H2513 Age-related nuclear cataract, bilateral: Secondary | ICD-10-CM | POA: Diagnosis not present

## 2015-10-21 DIAGNOSIS — E113413 Type 2 diabetes mellitus with severe nonproliferative diabetic retinopathy with macular edema, bilateral: Secondary | ICD-10-CM | POA: Diagnosis not present

## 2015-10-21 DIAGNOSIS — H3581 Retinal edema: Secondary | ICD-10-CM | POA: Diagnosis not present

## 2015-10-21 LAB — HM DIABETES EYE EXAM

## 2015-10-23 DIAGNOSIS — E785 Hyperlipidemia, unspecified: Secondary | ICD-10-CM | POA: Diagnosis not present

## 2015-10-23 DIAGNOSIS — N183 Chronic kidney disease, stage 3 (moderate): Secondary | ICD-10-CM | POA: Diagnosis not present

## 2015-10-23 DIAGNOSIS — I69351 Hemiplegia and hemiparesis following cerebral infarction affecting right dominant side: Secondary | ICD-10-CM | POA: Diagnosis not present

## 2015-10-23 DIAGNOSIS — E13319 Other specified diabetes mellitus with unspecified diabetic retinopathy without macular edema: Secondary | ICD-10-CM | POA: Diagnosis not present

## 2015-10-23 DIAGNOSIS — E1122 Type 2 diabetes mellitus with diabetic chronic kidney disease: Secondary | ICD-10-CM | POA: Diagnosis not present

## 2015-10-23 DIAGNOSIS — I69318 Other symptoms and signs involving cognitive functions following cerebral infarction: Secondary | ICD-10-CM | POA: Diagnosis not present

## 2015-10-27 ENCOUNTER — Ambulatory Visit (INDEPENDENT_AMBULATORY_CARE_PROVIDER_SITE_OTHER): Payer: Medicare Other | Admitting: Internal Medicine

## 2015-10-27 ENCOUNTER — Encounter: Payer: Self-pay | Admitting: Internal Medicine

## 2015-10-27 VITALS — BP 140/61 | HR 77 | Temp 97.8°F | Ht 64.0 in | Wt 239.0 lb

## 2015-10-27 DIAGNOSIS — E1122 Type 2 diabetes mellitus with diabetic chronic kidney disease: Secondary | ICD-10-CM | POA: Diagnosis not present

## 2015-10-27 DIAGNOSIS — Z7984 Long term (current) use of oral hypoglycemic drugs: Secondary | ICD-10-CM | POA: Diagnosis not present

## 2015-10-27 DIAGNOSIS — I6349 Cerebral infarction due to embolism of other cerebral artery: Secondary | ICD-10-CM

## 2015-10-27 DIAGNOSIS — I69351 Hemiplegia and hemiparesis following cerebral infarction affecting right dominant side: Secondary | ICD-10-CM | POA: Diagnosis not present

## 2015-10-27 DIAGNOSIS — E1165 Type 2 diabetes mellitus with hyperglycemia: Secondary | ICD-10-CM

## 2015-10-27 DIAGNOSIS — I69328 Other speech and language deficits following cerebral infarction: Secondary | ICD-10-CM

## 2015-10-27 DIAGNOSIS — I10 Essential (primary) hypertension: Secondary | ICD-10-CM

## 2015-10-27 DIAGNOSIS — E11319 Type 2 diabetes mellitus with unspecified diabetic retinopathy without macular edema: Secondary | ICD-10-CM | POA: Diagnosis not present

## 2015-10-27 DIAGNOSIS — E113299 Type 2 diabetes mellitus with mild nonproliferative diabetic retinopathy without macular edema, unspecified eye: Secondary | ICD-10-CM

## 2015-10-27 DIAGNOSIS — N183 Chronic kidney disease, stage 3 (moderate): Secondary | ICD-10-CM | POA: Diagnosis not present

## 2015-10-27 DIAGNOSIS — E13319 Other specified diabetes mellitus with unspecified diabetic retinopathy without macular edema: Secondary | ICD-10-CM | POA: Diagnosis not present

## 2015-10-27 DIAGNOSIS — I69318 Other symptoms and signs involving cognitive functions following cerebral infarction: Secondary | ICD-10-CM | POA: Diagnosis not present

## 2015-10-27 DIAGNOSIS — E785 Hyperlipidemia, unspecified: Secondary | ICD-10-CM | POA: Diagnosis not present

## 2015-10-27 LAB — GLUCOSE, CAPILLARY: Glucose-Capillary: 183 mg/dL — ABNORMAL HIGH (ref 65–99)

## 2015-10-27 MED ORDER — GLUCOSE BLOOD VI STRP: ORAL_STRIP | Status: DC

## 2015-10-27 MED ORDER — SITAGLIPTIN PHOSPHATE 25 MG PO TABS: 25.0000 mg | ORAL_TABLET | Freq: Every day | ORAL | Status: DC

## 2015-10-27 NOTE — Assessment & Plan Note (Signed)
Went to hospital on 4/14 with speech difficulty and right sided weakness. Imaging showed multiple subcentimeter infarcts of Left temporal, frontal, and pariental lobes and MCA territory. Had ?PFO on TEE. No evidence of DVT. Had loop recorder,  No abnormal rhythm detected.  She is doing better. Normal neuro exam now. Speech is improving.  We will continue to do secondary prevention: already on lipitor 40mg  daily that was start in the hospital.  HTN well controlled today. Will work on controlling her DM II better.

## 2015-10-27 NOTE — Patient Instructions (Addendum)
Please take glipizide 5mg  twice a day.  Please start taking januvia 25mg  everyday.  Continue your other medications with these.  Follow up in 1 month.  Watch your carb intake. Avoid high carb foods. Try to get some exercise.   Xray results:  CLINICAL DATA: Left knee pain common no known injury, initial encounter  EXAM: LEFT KNEE - COMPLETE 4+ VIEW  COMPARISON: 10/21/2014  FINDINGS: Significant joint space narrowing is noted medially with associated osteophytic changes. Osteophytes are also noted in the lateral joint compartment as well as the patellofemoral space. Small joint effusion is noted. No acute fracture or dislocation is seen.  IMPRESSION: Degenerative change without acute abnormality.   Electronically Signed  By: Inez Catalina M.D.  On: 07/29/2015 08:30

## 2015-10-27 NOTE — Assessment & Plan Note (Signed)
hgab1c 8.9 four weeks ago. On glipizide 5mg  bid. Does not want to be on insulin. Had reaction to metformin. Nervous about trying anything else but agreeable to give Januvia a try. Watching what she eats, cut down on pasta and rice.    Will cont glipizide 5mg  bid. Will start [place] 25mg  daily. Asked to check her sugar 3 times a day.  F/up in 1 month.

## 2015-10-27 NOTE — Progress Notes (Signed)
   Subjective:    Patient ID: Wendy [name], female    DOB: 07-29-1950, 65 y.o.   MRN: [id number]  HPI  65 yo female with hx of DM II, HTN, HLD, CKD III, recent hospitalization on 4/14 to 4/17 with stroke here for follow up.   CVA: Martin Majestic to hospital on 4/14 with speech difficulty and right sided weakness. Imaging showed multiple subcentimeter infarcts of Left temporal, frontal, and pariental lobes and MCA territory. Had ?PFO on TEE. No evidence of DVT. Had loop recorder,  No abnormal rhythm detected. Was started on aspirin and lipitor 40mg  daily Has been receiving home SLP. Speech is improving. No weakness anymore.  DM II: hgab1c 8.9 four weeks ago. On glipizide 5mg  bid. Does not want to be on insulin. Had reaction to metformin. Nervous about trying anything else but agreeable to give Januvia a try. Watching what she eats, cut down on pasta and rice.   HTN: on hctz 25mg  daily. BP stable on this.   Review of Systems  Constitutional: Negative for fever and chills.  HENT: Negative for congestion and sore throat.   Eyes: Negative for photophobia and visual disturbance.  Respiratory: Negative for chest tightness, shortness of breath and wheezing.   Cardiovascular: Negative for chest pain, palpitations and leg swelling.  Gastrointestinal: Negative for abdominal pain and abdominal distention.  Neurological: Negative for dizziness, weakness, numbness and headaches.       Objective:   Physical Exam  Constitutional: She is oriented to person, place, and time. She appears well-developed and well-nourished.  HENT:  Head: Normocephalic and atraumatic.  Eyes: EOM are normal. Pupils are equal, round, and reactive to light.  Cardiovascular: Normal rate and regular rhythm.  Exam reveals no gallop and no friction rub.   No murmur heard. Pulmonary/Chest: Effort normal and breath sounds normal. No respiratory distress. She has no wheezes.  Abdominal: Soft. Bowel sounds are normal. She exhibits no  distension. There is no tenderness.  Musculoskeletal: Normal range of motion. She exhibits no edema or tenderness.  Neurological: She is alert and oriented to person, place, and time. No cranial nerve deficit.  Intact str 5/5. Normal sensation.      Filed Vitals:   10/27/15 1448  BP: 140/61  Pulse: 77  Temp: 97.8 F (36.6 C)        Assessment & Plan:  See problem based a&p.

## 2015-10-28 ENCOUNTER — Other Ambulatory Visit: Payer: Self-pay | Admitting: *Deleted

## 2015-10-28 DIAGNOSIS — E113299 Type 2 diabetes mellitus with mild nonproliferative diabetic retinopathy without macular edema, unspecified eye: Secondary | ICD-10-CM

## 2015-10-28 NOTE — Telephone Encounter (Signed)
[facility] needs a new prescription for test strips with the type needed. Last meter purchased through [employer] was a [employer] in March/2016. Unable to contact patient. Thanks!

## 2015-10-28 NOTE — Progress Notes (Signed)
Internal Medicine Clinic Attending  Case discussed with Dr. Ahmed at the time of the visit.  We reviewed the resident's history and exam and pertinent patient test results.  I agree with the assessment, diagnosis, and plan of care documented in the resident's note. 

## 2015-10-29 DIAGNOSIS — I69351 Hemiplegia and hemiparesis following cerebral infarction affecting right dominant side: Secondary | ICD-10-CM | POA: Diagnosis not present

## 2015-10-29 DIAGNOSIS — N183 Chronic kidney disease, stage 3 (moderate): Secondary | ICD-10-CM | POA: Diagnosis not present

## 2015-10-29 DIAGNOSIS — I69318 Other symptoms and signs involving cognitive functions following cerebral infarction: Secondary | ICD-10-CM | POA: Diagnosis not present

## 2015-10-29 DIAGNOSIS — E1122 Type 2 diabetes mellitus with diabetic chronic kidney disease: Secondary | ICD-10-CM | POA: Diagnosis not present

## 2015-10-29 DIAGNOSIS — E13319 Other specified diabetes mellitus with unspecified diabetic retinopathy without macular edema: Secondary | ICD-10-CM | POA: Diagnosis not present

## 2015-10-29 DIAGNOSIS — E785 Hyperlipidemia, unspecified: Secondary | ICD-10-CM | POA: Diagnosis not present

## 2015-10-29 MED ORDER — GLUCOSE BLOOD VI STRP: ORAL_STRIP | Status: DC

## 2015-10-30 DIAGNOSIS — E1122 Type 2 diabetes mellitus with diabetic chronic kidney disease: Secondary | ICD-10-CM | POA: Diagnosis not present

## 2015-10-30 DIAGNOSIS — N183 Chronic kidney disease, stage 3 (moderate): Secondary | ICD-10-CM | POA: Diagnosis not present

## 2015-10-30 DIAGNOSIS — I69351 Hemiplegia and hemiparesis following cerebral infarction affecting right dominant side: Secondary | ICD-10-CM | POA: Diagnosis not present

## 2015-10-30 DIAGNOSIS — E13319 Other specified diabetes mellitus with unspecified diabetic retinopathy without macular edema: Secondary | ICD-10-CM | POA: Diagnosis not present

## 2015-10-30 DIAGNOSIS — E785 Hyperlipidemia, unspecified: Secondary | ICD-10-CM | POA: Diagnosis not present

## 2015-10-30 DIAGNOSIS — I69318 Other symptoms and signs involving cognitive functions following cerebral infarction: Secondary | ICD-10-CM | POA: Diagnosis not present

## 2015-11-11 DIAGNOSIS — E13319 Other specified diabetes mellitus with unspecified diabetic retinopathy without macular edema: Secondary | ICD-10-CM | POA: Diagnosis not present

## 2015-11-11 DIAGNOSIS — E785 Hyperlipidemia, unspecified: Secondary | ICD-10-CM | POA: Diagnosis not present

## 2015-11-11 DIAGNOSIS — I69351 Hemiplegia and hemiparesis following cerebral infarction affecting right dominant side: Secondary | ICD-10-CM | POA: Diagnosis not present

## 2015-11-11 DIAGNOSIS — E1122 Type 2 diabetes mellitus with diabetic chronic kidney disease: Secondary | ICD-10-CM | POA: Diagnosis not present

## 2015-11-11 DIAGNOSIS — I69318 Other symptoms and signs involving cognitive functions following cerebral infarction: Secondary | ICD-10-CM | POA: Diagnosis not present

## 2015-11-11 DIAGNOSIS — N183 Chronic kidney disease, stage 3 (moderate): Secondary | ICD-10-CM | POA: Diagnosis not present

## 2015-11-24 DIAGNOSIS — E1122 Type 2 diabetes mellitus with diabetic chronic kidney disease: Secondary | ICD-10-CM | POA: Diagnosis not present

## 2015-11-24 DIAGNOSIS — E13319 Other specified diabetes mellitus with unspecified diabetic retinopathy without macular edema: Secondary | ICD-10-CM | POA: Diagnosis not present

## 2015-11-24 DIAGNOSIS — N183 Chronic kidney disease, stage 3 (moderate): Secondary | ICD-10-CM | POA: Diagnosis not present

## 2015-11-24 DIAGNOSIS — I69318 Other symptoms and signs involving cognitive functions following cerebral infarction: Secondary | ICD-10-CM | POA: Diagnosis not present

## 2015-11-24 DIAGNOSIS — I69351 Hemiplegia and hemiparesis following cerebral infarction affecting right dominant side: Secondary | ICD-10-CM | POA: Diagnosis not present

## 2015-11-24 DIAGNOSIS — E785 Hyperlipidemia, unspecified: Secondary | ICD-10-CM | POA: Diagnosis not present

## 2015-11-27 ENCOUNTER — Ambulatory Visit: Payer: PRIVATE HEALTH INSURANCE | Admitting: Internal Medicine

## 2015-11-28 ENCOUNTER — Ambulatory Visit (INDEPENDENT_AMBULATORY_CARE_PROVIDER_SITE_OTHER): Payer: Medicare Other | Admitting: *Deleted

## 2015-11-28 DIAGNOSIS — Z95818 Presence of other cardiac implants and grafts: Secondary | ICD-10-CM

## 2015-12-01 ENCOUNTER — Ambulatory Visit (INDEPENDENT_AMBULATORY_CARE_PROVIDER_SITE_OTHER): Payer: Medicare Other | Admitting: Internal Medicine

## 2015-12-01 ENCOUNTER — Encounter: Payer: Self-pay | Admitting: Internal Medicine

## 2015-12-01 VITALS — BP 141/66 | HR 84 | Temp 98.1°F | Ht 64.0 in | Wt 236.2 lb

## 2015-12-01 DIAGNOSIS — I10 Essential (primary) hypertension: Secondary | ICD-10-CM

## 2015-12-01 DIAGNOSIS — Z7984 Long term (current) use of oral hypoglycemic drugs: Secondary | ICD-10-CM

## 2015-12-01 DIAGNOSIS — E113299 Type 2 diabetes mellitus with mild nonproliferative diabetic retinopathy without macular edema, unspecified eye: Secondary | ICD-10-CM

## 2015-12-01 DIAGNOSIS — E1165 Type 2 diabetes mellitus with hyperglycemia: Secondary | ICD-10-CM

## 2015-12-01 LAB — GLUCOSE, CAPILLARY: Glucose-Capillary: 188 mg/dL — ABNORMAL HIGH (ref 65–99)

## 2015-12-01 NOTE — Patient Instructions (Signed)
I am glad you are feeling well.  Continue your medications.  Follow up in 2 months.

## 2015-12-01 NOTE — Progress Notes (Signed)
Carelink Summary Report / Loop Recorder 

## 2015-12-01 NOTE — Assessment & Plan Note (Addendum)
Filed Vitals:   12/01/15 1446 12/01/15 1447  BP:  141/66  Pulse:  84  Temp: 98.1 F (36.7 C)    Doing well on HCTZ 25mg  daily. Had proteinruia based on last few urine microalbumins, discussed about changing BP meds to ARB (states that she had cough with lisinopril al though I can't find that in the records). She wanted to wait until next visit.

## 2015-12-01 NOTE — Progress Notes (Signed)
   Subjective:    Patient ID: Wendy [name], female    DOB: 02-08-1951, 65 y.o.   MRN: [id number]  HPI  65 yo F with DM II, HTN, HLD, CKD III, recent stroke on 09/26/15 here for DM II follow up.   Doing well. Has Implantable loop recorder.   DM II - hgba1c was 8.9 on 09/29/15, on glipizide 5mg  bid, did not want to be on insulin, was agreeable to try [place] last visit.  However, she went to pick it up and it was $300 for her. Checks sugar at home, runs usually in high 200's. She has not been properly following instruction about checking her sugar (using the first blood drop for testing) and has been getting inconsistent results. Asked her to check it properly. Patient is not willing to try any other meds now (no orals or injectables). Wants to stay on glipizide.    Review of Systems  Constitutional: Negative for fever and chills.  HENT: Negative for congestion and sore throat.   Eyes: Negative for pain, discharge and visual disturbance.  Respiratory: Negative for cough, chest tightness, shortness of breath and wheezing.   Cardiovascular: Negative for chest pain, palpitations and leg swelling.  Gastrointestinal: Negative for nausea, diarrhea, abdominal distention and anal bleeding.  Genitourinary: Negative for dysuria and difficulty urinating.  Musculoskeletal: Negative for back pain, joint swelling, arthralgias and neck pain.  Skin: Negative.  Negative for rash.  Allergic/Immunologic: Negative.   Neurological: Negative for dizziness, weakness, numbness and headaches.  Hematological: Negative.   Psychiatric/Behavioral: Negative.        Objective:   Physical Exam  Constitutional: She is oriented to person, place, and time. She appears well-developed and well-nourished. No distress.  HENT:  Head: Normocephalic and atraumatic.  Eyes: Conjunctivae are normal. Right eye exhibits no discharge. Left eye exhibits no discharge. No scleral icterus.  Neck: Normal range of motion.    Cardiovascular: Normal rate, regular rhythm, S1 normal, S2 normal and normal heart sounds.  Exam reveals no gallop and no friction rub.   No murmur heard. Pulmonary/Chest: Effort normal and breath sounds normal. No respiratory distress. She has no wheezes.  Abdominal: Soft. Bowel sounds are normal. She exhibits no distension. There is no tenderness.  Musculoskeletal: Normal range of motion. She exhibits no edema or tenderness.  Neurological: She is alert and oriented to person, place, and time. She has normal strength and normal reflexes. No cranial nerve deficit or sensory deficit.  Skin: She is not diaphoretic.  Psychiatric: She has a normal mood and affect.    Filed Vitals:   12/01/15 1446 12/01/15 1447  BP:  141/66  Pulse:  84  Temp: 98.1 F (36.7 C)          Assessment & Plan:  See problem based a&p.

## 2015-12-02 NOTE — Progress Notes (Signed)
Internal Medicine Clinic Attending  Case discussed with Dr. Ahmed at the time of the visit.  We reviewed the resident's history and exam and pertinent patient test results.  I agree with the assessment, diagnosis, and plan of care documented in the resident's note. 

## 2015-12-17 ENCOUNTER — Encounter: Payer: Self-pay | Admitting: Neurology

## 2015-12-17 ENCOUNTER — Ambulatory Visit (INDEPENDENT_AMBULATORY_CARE_PROVIDER_SITE_OTHER): Payer: Medicare Other | Admitting: Neurology

## 2015-12-17 VITALS — BP 152/83 | HR 74 | Ht 64.0 in | Wt 236.2 lb

## 2015-12-17 DIAGNOSIS — I639 Cerebral infarction, unspecified: Secondary | ICD-10-CM

## 2015-12-17 DIAGNOSIS — I63412 Cerebral infarction due to embolism of left middle cerebral artery: Secondary | ICD-10-CM

## 2015-12-17 NOTE — Patient Instructions (Signed)
I had a long d/w patient and son about her recent  Embolic stroke, risk for recurrent stroke/TIAs, personally independently reviewed imaging studies and stroke evaluation results and answered questions.Continue aspirin 325 mg daily  for secondary stroke prevention and maintain strict control of hypertension with blood pressure goal below 130/90, diabetes with hemoglobin A1c goal below 6.5% and lipids with LDL cholesterol goal below 70 mg/dL. I also advised the patient to eat a healthy diet with plenty of whole grains, cereals, fruits and vegetables, exercise regularly and maintain ideal body weight. Check transcranial Doppler bubble study for PFO and emboli monitoring. Patient may consider possible participation in the respect he slows trial if interested. She was given written information to review at home. I instructed the patient to avoid any further eye injections of Eylea ( afibercept)  since stroke and thromboembolic events have been described with it Followup in the future with me as needed Stroke Prevention Some medical conditions and behaviors are associated with an increased chance of having a stroke. You may prevent a stroke by making healthy choices and managing medical conditions. HOW CAN I REDUCE MY RISK OF HAVING A STROKE?   Stay physically active. Get at least 30 minutes of activity on most or all days.  Do not smoke. It may also be helpful to avoid exposure to secondhand smoke.  Limit alcohol use. Moderate alcohol use is considered to be:  No more than 2 drinks per day for men.  No more than 1 drink per day for nonpregnant women.  Eat healthy foods. This involves:  Eating 5 or more servings of fruits and vegetables a day.  Making dietary changes that address high blood pressure (hypertension), high cholesterol, diabetes, or obesity.  Manage your cholesterol levels.  Making food choices that are high in fiber and low in saturated fat, trans fat, and cholesterol may control  cholesterol levels.  Take any prescribed medicines to control cholesterol as directed by your health care provider.  Manage your diabetes.  Controlling your carbohydrate and sugar intake is recommended to manage diabetes.  Take any prescribed medicines to control diabetes as directed by your health care provider.  Control your hypertension.  Making food choices that are low in salt (sodium), saturated fat, trans fat, and cholesterol is recommended to manage hypertension.  Ask your health care provider if you need treatment to lower your blood pressure. Take any prescribed medicines to control hypertension as directed by your health care provider.  If you are 3718-65 years of age, have your blood pressure checked every 3-5 years. If you are 65 years of age or older, have your blood pressure checked every year.  Maintain a healthy weight.  Reducing calorie intake and making food choices that are low in sodium, saturated fat, trans fat, and cholesterol are recommended to manage weight.  Stop drug abuse.  Avoid taking birth control pills.  Talk to your health care provider about the risks of taking birth control pills if you are over 65 years old, smoke, get migraines, or have ever had a blood clot.  Get evaluated for sleep disorders (sleep apnea).  Talk to your health care provider about getting a sleep evaluation if you snore a lot or have excessive sleepiness.  Take medicines only as directed by your health care provider.  For some people, aspirin or blood thinners (anticoagulants) are helpful in reducing the risk of forming abnormal blood clots that can lead to stroke. If you have the irregular heart rhythm of atrial  fibrillation, you should be on a blood thinner unless there is a good reason you cannot take them.  Understand all your medicine instructions.  Make sure that other conditions (such as anemia or atherosclerosis) are addressed. SEEK IMMEDIATE MEDICAL CARE IF:   You  have sudden weakness or numbness of the face, arm, or leg, especially on one side of the body.  Your face or eyelid droops to one side.  You have sudden confusion.  You have trouble speaking (aphasia) or understanding.  You have sudden trouble seeing in one or both eyes.  You have sudden trouble walking.  You have dizziness.  You have a loss of balance or coordination.  You have a sudden, severe headache with no known cause.  You have new chest pain or an irregular heartbeat. Any of these symptoms may represent a serious problem that is an emergency. Do not wait to see if the symptoms will go away. Get medical help at once. Call your local emergency services (911 in [place] Do not drive yourself to the hospital.   This information is not intended to replace advice given to you by your health care provider. Make sure you discuss any questions you have with your health care provider.   Document Released: 07/08/2004 Document Revised: 06/21/2014 Document Reviewed: 12/01/2012 Elsevier Interactive Patient Education [employer].

## 2015-12-17 NOTE — Progress Notes (Signed)
[facility] [address] [place]. [place] [address] [phone]       OFFICE FOLLOW-UP NOTE  Ms. Wendy [name] Date of Birth:  05-18-1951 Medical Record Number:  [id number]   HPI:  65 year old African-American lady seen today for the first office follow-up visit following hospital admission for stroke in April 2017.Wendy [name] is an 65 y.o. female history diabetes mellitus, hypertension, lipidemia who presented following onset of speech difficulty and right-sided weakness on the previous afternoon. She was last known well at 4 PM on 09/25/2015. She had a similar change in speech about one month earlier which apparently resolved, and for which she did not seek medical attention. CT scan of her head showed lacunar infarction in the left putamen thought to be subacute. MRI is pending. NIH stroke score was 3. She has not been on antiplatelet therapy and has been started on aspirin. LSN: 4:00 PM on 09/25/2015. tPA Given: No: Beyond time window for treatment consideration. MRI scan the brain showed patchy left frontal temporal and parietal MCA branch infarct and MRA showed occlusion of the distal M1 segment of the left middle cerebral artery. Only minor atheromatous changes elsewhere. Carotid ultrasound showed no significant extracranial stenosis. Telemetry monitoring did not reveal significant cardiac arrhythmias. Patient underwent transesophageal echo which showed a questionable PFO but no clot or definite cardiac source of embolism. Patient has a loop recorder implanted and so far atrial fibrillation has not been found. Hemoglobin A1c was 8.9. Patient was started on aspirin for stroke prevention. She informs me today that she was getting Eylea ( afibercept)  intraocular injections for diabetes macular degeneration and feels that may have contributed to her stroke and clot formation. She states she's done well and the speech has recovered nearly completely. He occasionally she  hesitates and she is tired. She has no other new complaints or any recurrent stroke or TIA symptoms. She starting aspirin well without bruising or bleeding. She states her blood pressure is well controlled and today it is elevated at 152/83. Fasting sugars have all been doing better now and follow-up him about an A1c has not been checked.  ROS:   14 system review of systems is positive for fatigue, eye pain, blurred vision, constipation, urination problems, incontinence of bladder, joint pain and swelling, aching muscles, memory loss, confusion, headache, numbness, weakness, slurred speech, dizziness, anxiety, not enough sleep, decreased energy, racing thoughts, insomnia and all other systems negative  PMH:  Past Medical History  Diagnosis Date  . Sarcoidosis ([place])   . Hypertension   . Diabetes mellitus   . Stroke [facility])     Social History:  Social History   Social History  . Marital Status: Married    Spouse Name: N/A  . Number of Children: N/A  . Years of Education: N/A   Occupational History  . Not on file.   Social History Main Topics  . Smoking status: Former Smoker    Quit date: 08/09/1976  . Smokeless tobacco: Not on file  . Alcohol Use: No  . Drug Use: No  . Sexual Activity: Not on file   Other Topics Concern  . Not on file   Social History Narrative    Medications:   Current Outpatient Prescriptions on File Prior to Visit  Medication Sig Dispense Refill  . aspirin 325 MG tablet Take 1 tablet (325 mg total) by mouth daily. 30 tablet 0  . glipiZIDE (GLUCOTROL) 5 MG tablet Take 1 tablet (5 mg total) by mouth  2 (two) times daily before a meal. 180 tablet 1  . glucose blood test strip Use as instructed to check blood sugar at least 3 times daily for uncontrolled diabetes. 100 each 12  . hydrochlorothiazide (HYDRODIURIL) 25 MG tablet Take 1 tablet (25 mg total) by mouth daily. 30 tablet 3  . Lancet Device MISC For True Track meter - use to check blood sugars once a  week. Dx code: 250.00. 100 each 11  . Lancets (ACCU-CHEK MULTICLIX) lancets Use as instructed 100 each 12  . Nutritional Supplements (NUTRITIONAL SUPPLEMENT PO) Take 1 tablet by mouth daily. High Blood Sugar.     No current facility-administered medications on file prior to visit.    Allergies:   Allergies  Allergen Reactions  . Metformin And Related Nausea Only    "severe itching"    Physical Exam General: Obese middle-aged African-American lady seated, in no evident distress Head: head normocephalic and atraumatic.  Neck: supple with no carotid or supraclavicular bruits Cardiovascular: regular rate and rhythm, no murmurs Musculoskeletal: no deformity Skin:  no rash/petichiae Vascular:  Normal pulses all extremities Filed Vitals:   12/17/15 1446  BP: 152/83  Pulse: 74   Neurologic Exam Mental Status: Awake and fully alert. Oriented to place and time. Recent and remote memory intact. Attention span, concentration and fund of knowledge appropriate. Mood and affect appropriate.  Cranial Nerves: Fundoscopic exam reveals sharp disc margins. Pupils equal, briskly reactive to light. Extraocular movements full without nystagmus. Visual fields full to confrontation. Hearing intact. Facial sensation intact. Face, tongue, palate moves normally and symmetrically.  Motor: Normal bulk and tone. Normal strength in all tested extremity muscles. Sensory.: intact to touch ,pinprick .position and vibratory sensation.  Coordination: Rapid alternating movements normal in all extremities. Finger-to-nose and heel-to-shin performed accurately bilaterally. Gait and Station: Arises from chair without difficulty. Stance is normal. Gait demonstrates normal stride length and balance . Able to heel, toe and tandem walk with slight difficulty.  Reflexes: 1+ and symmetric. Toes downgoing.   NIHSS 0 Modified Rankin  2  ASSESSMENT: 51 year Caucasian lady with embolic left MCA branch infarct due to left distal  M1 middle cerebral artery occlusion in April 2017 was doing clinically quite well with near complete recovery. Etiology of her stroke is cryptogenic. Multiple vascular risk factors of diabetes, hypertension, hyperlipidemia and obesity    PLAN: I had a long d/w patient and son about her recent  embolic stroke, risk for recurrent stroke/TIAs, personally independently reviewed imaging studies and stroke evaluation results and answered questions.Continue aspirin 325 mg daily  for secondary stroke prevention and maintain strict control of hypertension with blood pressure goal below 130/90, diabetes with hemoglobin A1c goal below 6.5% and lipids with LDL cholesterol goal below 70 mg/dL. I also advised the patient to eat a healthy diet with plenty of whole grains, cereals, fruits and vegetables, exercise regularly and maintain ideal body weight. Check transcranial Doppler bubble study for PFO and emboli monitoring. Patient may consider possible participation in the respect he slows trial if interested. She was given written information to review at home. I instructed the patient to avoid any further eye injections of Eylea ( afibercept)  since stroke and thromboembolic events have been described with it Followup in the future with me as needed Greater than 50% of time during this 25 minute visit was spent on counseling,explanation of diagnosis, planning of further management, discussion with patient and family and coordination of care  Antony Contras, MD Medical Director [facility]  [facility] Pager: [phone] 12/17/2015 6:13 PM  Note: This document was prepared with digital dictation and possible smart phrase technology. Any transcriptional errors that result from this process are unintentional

## 2015-12-18 ENCOUNTER — Telehealth: Payer: Self-pay | Admitting: Neurology

## 2015-12-18 NOTE — Telephone Encounter (Signed)
Wendy [name] This patient has a doppler in the work Que she does not need a PA doppler can be scheduled. If you click  On imaging Tab you will see order. Thanks Hinton Dyer

## 2015-12-26 LAB — CUP PACEART REMOTE DEVICE CHECK: MDC IDC SESS DTM: 20170616163721

## 2015-12-29 ENCOUNTER — Ambulatory Visit (INDEPENDENT_AMBULATORY_CARE_PROVIDER_SITE_OTHER): Payer: Medicare Other | Admitting: *Deleted

## 2015-12-29 DIAGNOSIS — Z95818 Presence of other cardiac implants and grafts: Secondary | ICD-10-CM

## 2015-12-29 NOTE — Progress Notes (Signed)
Carelink Summary Report / Loop Recorder 

## 2016-01-12 ENCOUNTER — Telehealth: Payer: Self-pay | Admitting: Neurology

## 2016-01-12 NOTE — Telephone Encounter (Signed)
Pt called in requesting more information on TCD Bubble test. Please call.

## 2016-01-13 NOTE — Telephone Encounter (Signed)
Rn call patient back about the TCD bubble study test. Rn explain the bubble study test procedure and verbalized understanding. PT ask if it was a co payment. Rn stated to patient to contact her insurance company. Pt verbalized understanding.

## 2016-01-15 ENCOUNTER — Telehealth: Payer: Self-pay

## 2016-01-15 ENCOUNTER — Ambulatory Visit (INDEPENDENT_AMBULATORY_CARE_PROVIDER_SITE_OTHER): Payer: Medicare Other

## 2016-01-15 DIAGNOSIS — I63412 Cerebral infarction due to embolism of left middle cerebral artery: Secondary | ICD-10-CM | POA: Diagnosis not present

## 2016-01-15 NOTE — Telephone Encounter (Signed)
Pt in office for bubble study. Placed 22G IV in right AC x1 attempt. Cleaned with chlorhexidine prior to insertion. +blood return and flushes without resistance. Secured with tegaderm and tape. Pt tolerated well. No complaint of pain upon flushing IV. No visible signs of swelling or bruising. Site clean/dry/intact. Placed IV at 1330.

## 2016-01-20 ENCOUNTER — Telehealth: Payer: Self-pay | Admitting: *Deleted

## 2016-01-20 NOTE — Telephone Encounter (Signed)
Call to patient to check on Diabetes and Hypertension.  Patient stated she is doing ok.  Asked what she is taking for her Diabetes.  Patient is on Glucotrol.  Patient stated that her blood sugars were ok.  Did not give me any of the readings.  Stated she does her checks after eating.  Stated she was checking her sugars before and after eating.  Due to cost of strips only checks after sometimes.  Was asked to bring in her meter to her appointment.  Plan is for patient to talk with doctor and Butch Penny Plyler the Diabetes Educator about the best time for her to do her glucose check.  Before or after meals.Patient was informed of time and date of her next appointment.  Patent stated that she is getting her meds and has 1 refill left of the Glucotrol.  Patient also said that she is taking a med for her blood pressure and is doing ok with that.    Patient mention that she is having pain in her hips, back, knees and top of foot.  Patient stated that her doctor is aware of her pain .  Will discuss more at her next appointment.  Patient was advised that she can call for an earlier appointment if she needs something for her pain.  Patient said tha she will wait until her appointment in the Clinics on 8/21.  Patient thanked me for calling and will come in on 02/02/2016 for her doctor visit.  Sander Nephew, RN 01/20/2016 1:45 PM

## 2016-01-22 LAB — CUP PACEART REMOTE DEVICE CHECK: MDC IDC SESS DTM: 20170716170622

## 2016-01-27 ENCOUNTER — Ambulatory Visit (INDEPENDENT_AMBULATORY_CARE_PROVIDER_SITE_OTHER): Payer: Medicare Other | Admitting: *Deleted

## 2016-01-27 DIAGNOSIS — Z95818 Presence of other cardiac implants and grafts: Secondary | ICD-10-CM

## 2016-01-28 NOTE — Progress Notes (Signed)
Carelink Summary Report / Loop Recorder 

## 2016-01-30 ENCOUNTER — Telehealth: Payer: Self-pay | Admitting: Internal Medicine

## 2016-01-30 NOTE — Telephone Encounter (Signed)
APT. REMINDER CALL, NO VOICEMAIL °

## 2016-02-02 ENCOUNTER — Encounter: Payer: Self-pay | Admitting: Internal Medicine

## 2016-02-02 ENCOUNTER — Encounter (INDEPENDENT_AMBULATORY_CARE_PROVIDER_SITE_OTHER): Payer: Self-pay

## 2016-02-02 ENCOUNTER — Ambulatory Visit (INDEPENDENT_AMBULATORY_CARE_PROVIDER_SITE_OTHER): Payer: Medicare Other | Admitting: Internal Medicine

## 2016-02-02 VITALS — BP 135/60 | HR 78 | Temp 98.4°F | Ht 64.0 in | Wt 238.1 lb

## 2016-02-02 DIAGNOSIS — Z87891 Personal history of nicotine dependence: Secondary | ICD-10-CM | POA: Diagnosis not present

## 2016-02-02 DIAGNOSIS — N189 Chronic kidney disease, unspecified: Secondary | ICD-10-CM

## 2016-02-02 DIAGNOSIS — Z7984 Long term (current) use of oral hypoglycemic drugs: Secondary | ICD-10-CM

## 2016-02-02 DIAGNOSIS — R202 Paresthesia of skin: Secondary | ICD-10-CM

## 2016-02-02 DIAGNOSIS — E11319 Type 2 diabetes mellitus with unspecified diabetic retinopathy without macular edema: Secondary | ICD-10-CM

## 2016-02-02 DIAGNOSIS — I10 Essential (primary) hypertension: Secondary | ICD-10-CM | POA: Diagnosis not present

## 2016-02-02 DIAGNOSIS — I129 Hypertensive chronic kidney disease with stage 1 through stage 4 chronic kidney disease, or unspecified chronic kidney disease: Secondary | ICD-10-CM | POA: Diagnosis not present

## 2016-02-02 DIAGNOSIS — E113299 Type 2 diabetes mellitus with mild nonproliferative diabetic retinopathy without macular edema, unspecified eye: Secondary | ICD-10-CM

## 2016-02-02 DIAGNOSIS — E785 Hyperlipidemia, unspecified: Secondary | ICD-10-CM | POA: Diagnosis not present

## 2016-02-02 DIAGNOSIS — Z7982 Long term (current) use of aspirin: Secondary | ICD-10-CM

## 2016-02-02 DIAGNOSIS — E1122 Type 2 diabetes mellitus with diabetic chronic kidney disease: Secondary | ICD-10-CM

## 2016-02-02 LAB — POCT GLYCOSYLATED HEMOGLOBIN (HGB A1C): Hemoglobin A1C: 8

## 2016-02-02 LAB — GLUCOSE, CAPILLARY: Glucose-Capillary: 158 mg/dL — ABNORMAL HIGH (ref 65–99)

## 2016-02-02 NOTE — Progress Notes (Signed)
   CC: follow up of DM II and HTN  HPI:  [name] is a 64 y.o. with below listed PMH here with f/up for HTN and DM II, also complaints of b/l feet numbness.  Past Medical History:  Diagnosis Date  . Diabetes mellitus   . Hypertension   . Sarcoidosis ([place])   . Stroke [facility])    Saw Dr. Leonie Man for stroke follow up. Had transcranial doppler bubble study, Impression: Positive Transcranial Doppler Bubble Study indicative of a very small right to left intracardiac shunt.   He recommended strict diabetes, HLD, and HTN control. And asa 325mg  daily.    HTN - 135/60 today, on hctz 25mg  daily. Due for blood work today  DM II - on glipizide 5mg  bid. Today hgba1c is 8.0, down from 8.9. Patient doesn't want to change her diabetic regimen at all. She found the glipizide was to expensive for her in the past.  Was on lipitor 40mg  daily, not sure why it was discontinued on 09/2015.   Review of Systems:   Review of Systems  Constitutional: Negative for chills and fever.  Eyes: Negative for blurred vision.  Respiratory: Negative for cough.   Cardiovascular: Negative for chest pain and palpitations.  Gastrointestinal: Negative for heartburn and nausea.  Neurological: Positive for tingling. Negative for headaches.     Physical Exam:  Vitals:   02/02/16 1433  BP: 135/60  Pulse: 78  Temp: 98.4 F (36.9 C)  TempSrc: Oral  SpO2: 100%  Weight: 238 lb 1.6 oz (108 kg)  Height: 5\' 4"  (1.626 m)   Physical Exam  Constitutional: She is oriented to person, place, and time. She appears well-developed and well-nourished. No distress.  HENT:  Head: Normocephalic and atraumatic.  Mouth/Throat: No oropharyngeal exudate.  Eyes: Conjunctivae are normal.  Cardiovascular: Exam reveals no gallop and no friction rub.   No murmur heard. Respiratory: Effort normal and breath sounds normal. No respiratory distress. She has no wheezes.  GI: Soft. Bowel sounds are normal. She exhibits no distension.   Musculoskeletal: Normal range of motion. She exhibits no edema or tenderness.  Neurological: She is alert and oriented to person, place, and time.  Skin: She is not diaphoretic.    Assessment & Plan:   See Encounters Tab for problem based charting.  Patient discussed with Dr. Evette Doffing

## 2016-02-02 NOTE — Assessment & Plan Note (Addendum)
on glipizide 5mg  bid. Today hgba1c is 8.0, down from 8.9. Patient doesn't want to change her diabetic regimen at all. She found the glipizide was to expensive for her in the past.  Given she has CK D, we have to be careful about increasing her glipizide dose. I wanted to change her glipizide to 7.5 mg twice a day, however patient is not willing to make any changes to her medication. She also does not want any other new medications for her diabetes. We discussed that in order to prevent another stroke from happening we have to control her diabetes strictly with hgba1c goal of <7. Patient understands this, however she declines to change any of her medications at this time.  Patient has some intermittent numbness and tingling on bilateral feet. We discussed gabapentin as an option to treat her possible diabetes related neuropathy pain. She wants to do more research on this medication before we start her on this.  We will have her follow-up with me in 3 months and recheck her hemoglobin A1c at that time.

## 2016-02-02 NOTE — Assessment & Plan Note (Signed)
Vitals:   02/02/16 1433  BP: 135/60  Pulse: 78  Temp: 98.4 F (36.9 C)   Currently on hydrocodone is a 25 mg daily. We'll check to be bmet today to make sure her electrolytes and kidney function are stable.

## 2016-02-02 NOTE — Assessment & Plan Note (Signed)
Lipid Panel     Component Value Date/Time   CHOL 240 (H) 09/23/2014 1423   TRIG 89 09/23/2014 1423   HDL 59 09/23/2014 1423   CHOLHDL 4.1 09/23/2014 1423   VLDL 18 09/23/2014 1423   LDLCALC 163 (H) 09/23/2014 1423   Patient has hyperlipidemia and needs to be on statin for secondary stroke prevention. However, patient states that she has done extensive reading about cholesterol medicines and is afraid of all of the side effects which she did not explain. She declines to be on statins at this time.

## 2016-02-02 NOTE — Patient Instructions (Signed)
I am glad to see you.  We recommend you take cholesterol medine but you have declined.  We also recommend you to increase your diabetes medication but you have declined.  The name of the medication I talked about for your neuropathy was Gabapentin. You wanted to read up on this. Let us know if you would like to start this in the future.   Continue your medications as is for now.  Follow up in 3 months.

## 2016-02-03 LAB — BASIC METABOLIC PANEL
BUN/Creatinine Ratio: 25 (ref 12–28)
BUN: 42 mg/dL — AB (ref 8–27)
CALCIUM: 9.3 mg/dL (ref 8.7–10.3)
CHLORIDE: 100 mmol/L (ref 96–106)
CO2: 24 mmol/L (ref 18–29)
CREATININE: 1.65 mg/dL — AB (ref 0.57–1.00)
GFR calc Af Amer: 37 mL/min/{1.73_m2} — ABNORMAL LOW (ref >59)
GFR calc non Af Amer: 32 mL/min/{1.73_m2} — ABNORMAL LOW (ref >59)
GLUCOSE: 148 mg/dL — AB (ref 65–99)
Potassium: 5 mmol/L (ref 3.5–5.2)
Sodium: 140 mmol/L (ref 134–144)

## 2016-02-03 NOTE — Progress Notes (Signed)
Internal Medicine Clinic Attending  Case discussed with Dr. Ahmed at the time of the visit.  We reviewed the resident's history and exam and pertinent patient test results.  I agree with the assessment, diagnosis, and plan of care documented in the resident's note. 

## 2016-02-04 ENCOUNTER — Other Ambulatory Visit: Payer: Self-pay | Admitting: Internal Medicine

## 2016-02-04 ENCOUNTER — Other Ambulatory Visit: Payer: Self-pay

## 2016-02-04 DIAGNOSIS — I10 Essential (primary) hypertension: Secondary | ICD-10-CM

## 2016-02-04 NOTE — Telephone Encounter (Signed)
Rx refilled - pt called/informed.

## 2016-02-04 NOTE — Telephone Encounter (Signed)
Requesting hydrochlorothiazide to be filled @ [place] on [address].

## 2016-02-24 LAB — CUP PACEART REMOTE DEVICE CHECK: MDC IDC SESS DTM: 20170815170632

## 2016-02-26 ENCOUNTER — Encounter: Payer: Medicare Other | Admitting: *Deleted

## 2016-03-29 ENCOUNTER — Ambulatory Visit (INDEPENDENT_AMBULATORY_CARE_PROVIDER_SITE_OTHER): Payer: Medicare Other | Admitting: *Deleted

## 2016-03-29 DIAGNOSIS — Z95818 Presence of other cardiac implants and grafts: Secondary | ICD-10-CM

## 2016-03-29 NOTE — Progress Notes (Signed)
Carelink Summary Report / Loop Recorder 

## 2016-03-30 ENCOUNTER — Telehealth: Payer: Self-pay | Admitting: Internal Medicine

## 2016-03-30 NOTE — Telephone Encounter (Signed)
Call pt to ask about how she is doing with medications, or any other measures she has been taken to manage her diabetes and high blood pressure. Pt states that she is doing fine so far, she does not have any issue. She is taking her blood pressure meds and have refills for up to 2018. She states that she is taking her diabetic pills and have refills. She states that the new appoint with the doctor is November. We encourage patient to be positive and do some exercises as tolerated. We remind her the date of the appointment which is on 04/26/2016 @ 2:30 pm.

## 2016-04-26 ENCOUNTER — Ambulatory Visit (INDEPENDENT_AMBULATORY_CARE_PROVIDER_SITE_OTHER): Payer: Medicare Other | Admitting: *Deleted

## 2016-04-26 DIAGNOSIS — Z95818 Presence of other cardiac implants and grafts: Secondary | ICD-10-CM

## 2016-04-27 NOTE — Progress Notes (Signed)
Carelink Summary Report / Loop Recorder 

## 2016-05-01 LAB — CUP PACEART REMOTE DEVICE CHECK
Implantable Pulse Generator Implant Date: 20170417
MDC IDC SESS DTM: 20171014180908

## 2016-05-01 NOTE — Progress Notes (Signed)
Carelink summary report received. Battery status OK. Normal device function. No new symptom episodes, tachy episodes, brady, or pause episodes. No new AF episodes. Monthly summary reports and ROV/PRN 

## 2016-05-11 ENCOUNTER — Telehealth: Payer: Self-pay

## 2016-05-11 NOTE — Telephone Encounter (Signed)
Wendy [name] is a 66 y.o. female who was contacted on behalf of [facility] Geriatrics Task Force. Patient reported no concerns with medications and stated that she is doing well overall. She did not have much time to talk and reports that she will follow up.

## 2016-05-25 NOTE — Telephone Encounter (Signed)
Patient was contacted with Frank Tillman, PharmD candidate. I agree with the assessment and plan of care documented.  

## 2016-05-26 ENCOUNTER — Ambulatory Visit (INDEPENDENT_AMBULATORY_CARE_PROVIDER_SITE_OTHER): Payer: Medicare Other | Admitting: *Deleted

## 2016-05-26 DIAGNOSIS — Z95818 Presence of other cardiac implants and grafts: Secondary | ICD-10-CM | POA: Diagnosis not present

## 2016-05-27 NOTE — Progress Notes (Signed)
Carelink Summary Report / Loop Recorder 

## 2016-06-09 LAB — CUP PACEART REMOTE DEVICE CHECK
Implantable Pulse Generator Implant Date: 20170417
MDC IDC SESS DTM: 20171113191039

## 2016-06-25 ENCOUNTER — Ambulatory Visit (INDEPENDENT_AMBULATORY_CARE_PROVIDER_SITE_OTHER): Payer: Medicare Other | Admitting: *Deleted

## 2016-06-25 DIAGNOSIS — Z95818 Presence of other cardiac implants and grafts: Secondary | ICD-10-CM

## 2016-06-28 NOTE — Progress Notes (Signed)
Carelink Summary Report / Loop Recorder 

## 2016-07-17 LAB — CUP PACEART REMOTE DEVICE CHECK
Date Time Interrogation Session: 20171213194245
MDC IDC PG IMPLANT DT: 20170417

## 2016-07-17 NOTE — Progress Notes (Signed)
Carelink summary report received. Battery status OK. Normal device function. No new symptom episodes, tachy episodes, brady, or pause episodes. No new AF episodes. Monthly summary reports and ROV/PRN 

## 2016-07-26 ENCOUNTER — Ambulatory Visit (INDEPENDENT_AMBULATORY_CARE_PROVIDER_SITE_OTHER): Payer: Medicare Other | Admitting: *Deleted

## 2016-07-26 ENCOUNTER — Other Ambulatory Visit: Payer: Self-pay | Admitting: Pharmacist

## 2016-07-26 DIAGNOSIS — E113299 Type 2 diabetes mellitus with mild nonproliferative diabetic retinopathy without macular edema, unspecified eye: Secondary | ICD-10-CM

## 2016-07-26 DIAGNOSIS — I10 Essential (primary) hypertension: Secondary | ICD-10-CM

## 2016-07-26 DIAGNOSIS — Z95818 Presence of other cardiac implants and grafts: Secondary | ICD-10-CM | POA: Diagnosis not present

## 2016-07-26 NOTE — Progress Notes (Signed)
Carelink Summary Report / Loop Recorder 

## 2016-07-27 MED ORDER — GLIPIZIDE 5 MG PO TABS: 5.0000 mg | ORAL_TABLET | Freq: Two times a day (BID) | ORAL | 1 refills | Status: DC

## 2016-07-27 MED ORDER — HYDROCHLOROTHIAZIDE 25 MG PO TABS: 25.0000 mg | ORAL_TABLET | Freq: Every day | ORAL | 3 refills | Status: DC

## 2016-08-05 LAB — CUP PACEART REMOTE DEVICE CHECK
Date Time Interrogation Session: 20180112203646
MDC IDC PG IMPLANT DT: 20170417

## 2016-08-10 DIAGNOSIS — E11649 Type 2 diabetes mellitus with hypoglycemia without coma: Secondary | ICD-10-CM | POA: Diagnosis not present

## 2016-08-10 DIAGNOSIS — Z136 Encounter for screening for cardiovascular disorders: Secondary | ICD-10-CM | POA: Diagnosis not present

## 2016-08-10 DIAGNOSIS — Z131 Encounter for screening for diabetes mellitus: Secondary | ICD-10-CM | POA: Diagnosis not present

## 2016-08-10 DIAGNOSIS — Z8673 Personal history of transient ischemic attack (TIA), and cerebral infarction without residual deficits: Secondary | ICD-10-CM | POA: Diagnosis not present

## 2016-08-10 DIAGNOSIS — Z01118 Encounter for examination of ears and hearing with other abnormal findings: Secondary | ICD-10-CM | POA: Diagnosis not present

## 2016-08-10 DIAGNOSIS — N390 Urinary tract infection, site not specified: Secondary | ICD-10-CM | POA: Diagnosis not present

## 2016-08-10 DIAGNOSIS — H538 Other visual disturbances: Secondary | ICD-10-CM | POA: Diagnosis not present

## 2016-08-10 DIAGNOSIS — Z5181 Encounter for therapeutic drug level monitoring: Secondary | ICD-10-CM | POA: Diagnosis not present

## 2016-08-10 DIAGNOSIS — H9209 Otalgia, unspecified ear: Secondary | ICD-10-CM | POA: Diagnosis not present

## 2016-08-10 DIAGNOSIS — I10 Essential (primary) hypertension: Secondary | ICD-10-CM | POA: Diagnosis not present

## 2016-08-11 DIAGNOSIS — Z136 Encounter for screening for cardiovascular disorders: Secondary | ICD-10-CM | POA: Diagnosis not present

## 2016-08-11 DIAGNOSIS — Z5181 Encounter for therapeutic drug level monitoring: Secondary | ICD-10-CM | POA: Diagnosis not present

## 2016-08-11 DIAGNOSIS — H538 Other visual disturbances: Secondary | ICD-10-CM | POA: Diagnosis not present

## 2016-08-11 DIAGNOSIS — Z131 Encounter for screening for diabetes mellitus: Secondary | ICD-10-CM | POA: Diagnosis not present

## 2016-08-11 DIAGNOSIS — Z01118 Encounter for examination of ears and hearing with other abnormal findings: Secondary | ICD-10-CM | POA: Diagnosis not present

## 2016-08-17 LAB — CUP PACEART REMOTE DEVICE CHECK
Implantable Pulse Generator Implant Date: 20170417
MDC IDC SESS DTM: 20180211203917

## 2016-08-17 NOTE — Progress Notes (Signed)
Carelink summary report received. Battery status OK. Normal device function. No new symptom episodes, tachy episodes, brady, or pause episodes. No new AF episodes. Monthly summary reports and ROV/PRN 

## 2016-08-24 ENCOUNTER — Ambulatory Visit (INDEPENDENT_AMBULATORY_CARE_PROVIDER_SITE_OTHER): Payer: Medicare Other | Admitting: *Deleted

## 2016-08-24 DIAGNOSIS — I639 Cerebral infarction, unspecified: Secondary | ICD-10-CM

## 2016-08-25 NOTE — Progress Notes (Signed)
Carelink Summary Report / Loop Recorder 

## 2016-08-29 LAB — CUP PACEART REMOTE DEVICE CHECK
Date Time Interrogation Session: 20180313203842
Implantable Pulse Generator Implant Date: 20170417

## 2016-08-29 NOTE — Progress Notes (Signed)
Carelink summary report received. Battery status OK. Normal device function. No new symptom episodes, tachy episodes, brady, or pause episodes. No new AF episodes. Monthly summary reports and ROV/PRN 

## 2016-09-07 DIAGNOSIS — E785 Hyperlipidemia, unspecified: Secondary | ICD-10-CM | POA: Diagnosis not present

## 2016-09-07 DIAGNOSIS — I10 Essential (primary) hypertension: Secondary | ICD-10-CM | POA: Diagnosis not present

## 2016-09-07 DIAGNOSIS — E11649 Type 2 diabetes mellitus with hypoglycemia without coma: Secondary | ICD-10-CM | POA: Diagnosis not present

## 2016-09-07 DIAGNOSIS — E559 Vitamin D deficiency, unspecified: Secondary | ICD-10-CM | POA: Diagnosis not present

## 2016-09-07 DIAGNOSIS — N289 Disorder of kidney and ureter, unspecified: Secondary | ICD-10-CM | POA: Diagnosis not present

## 2016-09-07 DIAGNOSIS — Z8673 Personal history of transient ischemic attack (TIA), and cerebral infarction without residual deficits: Secondary | ICD-10-CM | POA: Diagnosis not present

## 2016-09-23 ENCOUNTER — Ambulatory Visit (INDEPENDENT_AMBULATORY_CARE_PROVIDER_SITE_OTHER): Payer: Medicare Other | Admitting: *Deleted

## 2016-09-23 DIAGNOSIS — I639 Cerebral infarction, unspecified: Secondary | ICD-10-CM

## 2016-09-24 NOTE — Progress Notes (Signed)
Carelink Summary Report / Loop Recorder 

## 2016-10-04 LAB — CUP PACEART REMOTE DEVICE CHECK
Date Time Interrogation Session: 20180413025410
MDC IDC PG IMPLANT DT: 20170417

## 2016-10-07 DIAGNOSIS — L603 Nail dystrophy: Secondary | ICD-10-CM | POA: Diagnosis not present

## 2016-10-07 DIAGNOSIS — E1151 Type 2 diabetes mellitus with diabetic peripheral angiopathy without gangrene: Secondary | ICD-10-CM | POA: Diagnosis not present

## 2016-10-07 DIAGNOSIS — M792 Neuralgia and neuritis, unspecified: Secondary | ICD-10-CM | POA: Diagnosis not present

## 2016-10-07 DIAGNOSIS — L84 Corns and callosities: Secondary | ICD-10-CM | POA: Diagnosis not present

## 2016-10-07 DIAGNOSIS — I739 Peripheral vascular disease, unspecified: Secondary | ICD-10-CM | POA: Diagnosis not present

## 2016-10-13 ENCOUNTER — Telehealth: Payer: Self-pay | Admitting: Internal Medicine

## 2016-10-13 NOTE — Telephone Encounter (Signed)
I spoke with the patient. She states she is a year out from her stroke and having the loop recorder placed. She reports there has been no documented a-fib with this.  She is concerned about the cost she is incurring from the loop recorder being monitored. We discussed the reason for the recorder and that the battery life is about 3 years. However, will forward to Dr. Lovena Le to weigh in as to whether he feels she could/ should be monitored less frequent or stop her monitoring. She is agreeable with this and will await a call back with Dr. Tanna Furry recommendations.

## 2016-10-13 NOTE — Telephone Encounter (Signed)
Pt has a Loop Recorder put in last year and pt has question about when can she take it out oppose to other questions she has. Please call pt.

## 2016-10-18 ENCOUNTER — Encounter: Payer: Medicare Other | Admitting: Internal Medicine

## 2016-10-19 NOTE — Telephone Encounter (Signed)
Ultimately we recommend continued monthly monitoring but if she cannot afford to monitor then we can stop doing this. I would suggest if she cannot afford monthly monitoring to perform at least every 3 months. GT

## 2016-10-20 NOTE — Telephone Encounter (Signed)
Informed patient of Dr. Tanna Furry recommendations.  She understands that monitoring can be stopped and that his recommendation is to consider monitoring every 3 months.  I advised to check with her insurance to see what the cost/impact would be for every 3 month monitoring compared to monthly.   She would like to think this over and will call back.

## 2016-10-25 ENCOUNTER — Ambulatory Visit (INDEPENDENT_AMBULATORY_CARE_PROVIDER_SITE_OTHER): Payer: Medicare Other | Admitting: *Deleted

## 2016-10-25 DIAGNOSIS — I639 Cerebral infarction, unspecified: Secondary | ICD-10-CM

## 2016-10-25 NOTE — Progress Notes (Signed)
Carelink Summary Report / Loop Recorder 

## 2016-11-04 LAB — CUP PACEART REMOTE DEVICE CHECK
Date Time Interrogation Session: 20180512233806
MDC IDC PG IMPLANT DT: 20170417

## 2016-11-18 ENCOUNTER — Encounter: Payer: Self-pay | Admitting: *Deleted

## 2016-11-18 DIAGNOSIS — E559 Vitamin D deficiency, unspecified: Secondary | ICD-10-CM | POA: Diagnosis not present

## 2016-11-18 DIAGNOSIS — I10 Essential (primary) hypertension: Secondary | ICD-10-CM | POA: Diagnosis not present

## 2016-11-18 DIAGNOSIS — E785 Hyperlipidemia, unspecified: Secondary | ICD-10-CM | POA: Diagnosis not present

## 2016-11-18 DIAGNOSIS — E11649 Type 2 diabetes mellitus with hypoglycemia without coma: Secondary | ICD-10-CM | POA: Diagnosis not present

## 2016-11-18 DIAGNOSIS — Z8673 Personal history of transient ischemic attack (TIA), and cerebral infarction without residual deficits: Secondary | ICD-10-CM | POA: Diagnosis not present

## 2016-11-18 DIAGNOSIS — N289 Disorder of kidney and ureter, unspecified: Secondary | ICD-10-CM | POA: Diagnosis not present

## 2016-11-22 ENCOUNTER — Ambulatory Visit (INDEPENDENT_AMBULATORY_CARE_PROVIDER_SITE_OTHER): Payer: Medicare Other | Admitting: *Deleted

## 2016-11-22 DIAGNOSIS — I639 Cerebral infarction, unspecified: Secondary | ICD-10-CM

## 2016-11-23 NOTE — Progress Notes (Signed)
Carelink Summary Report / Loop Recorder 

## 2016-12-01 LAB — CUP PACEART REMOTE DEVICE CHECK
Date Time Interrogation Session: 20180611234112
MDC IDC PG IMPLANT DT: 20170417

## 2016-12-01 NOTE — Progress Notes (Signed)
Carelink summary report received. Battery status OK. Normal device function. No new symptom episodes, tachy episodes, brady, or pause episodes. No new AF episodes. Monthly summary reports and ROV/PRN 

## 2016-12-07 DIAGNOSIS — Z0000 Encounter for general adult medical examination without abnormal findings: Secondary | ICD-10-CM | POA: Diagnosis not present

## 2016-12-07 DIAGNOSIS — E785 Hyperlipidemia, unspecified: Secondary | ICD-10-CM | POA: Diagnosis not present

## 2016-12-07 DIAGNOSIS — Z8673 Personal history of transient ischemic attack (TIA), and cerebral infarction without residual deficits: Secondary | ICD-10-CM | POA: Diagnosis not present

## 2016-12-07 DIAGNOSIS — E11649 Type 2 diabetes mellitus with hypoglycemia without coma: Secondary | ICD-10-CM | POA: Diagnosis not present

## 2016-12-07 DIAGNOSIS — N289 Disorder of kidney and ureter, unspecified: Secondary | ICD-10-CM | POA: Diagnosis not present

## 2016-12-07 DIAGNOSIS — I10 Essential (primary) hypertension: Secondary | ICD-10-CM | POA: Diagnosis not present

## 2016-12-07 DIAGNOSIS — E559 Vitamin D deficiency, unspecified: Secondary | ICD-10-CM | POA: Diagnosis not present

## 2016-12-17 ENCOUNTER — Other Ambulatory Visit: Payer: Self-pay | Admitting: Internal Medicine

## 2016-12-17 DIAGNOSIS — I10 Essential (primary) hypertension: Secondary | ICD-10-CM

## 2016-12-20 DIAGNOSIS — H43822 Vitreomacular adhesion, left eye: Secondary | ICD-10-CM | POA: Diagnosis not present

## 2016-12-20 DIAGNOSIS — H35033 Hypertensive retinopathy, bilateral: Secondary | ICD-10-CM | POA: Diagnosis not present

## 2016-12-20 DIAGNOSIS — H3582 Retinal ischemia: Secondary | ICD-10-CM | POA: Diagnosis not present

## 2016-12-20 DIAGNOSIS — E113313 Type 2 diabetes mellitus with moderate nonproliferative diabetic retinopathy with macular edema, bilateral: Secondary | ICD-10-CM | POA: Diagnosis not present

## 2016-12-22 ENCOUNTER — Ambulatory Visit (INDEPENDENT_AMBULATORY_CARE_PROVIDER_SITE_OTHER): Payer: Medicare Other | Admitting: *Deleted

## 2016-12-22 DIAGNOSIS — I639 Cerebral infarction, unspecified: Secondary | ICD-10-CM | POA: Diagnosis not present

## 2016-12-23 NOTE — Progress Notes (Signed)
Carelink Summary Report / Loop Recorder 

## 2016-12-28 DIAGNOSIS — E11649 Type 2 diabetes mellitus with hypoglycemia without coma: Secondary | ICD-10-CM | POA: Diagnosis not present

## 2016-12-28 DIAGNOSIS — E785 Hyperlipidemia, unspecified: Secondary | ICD-10-CM | POA: Diagnosis not present

## 2016-12-28 DIAGNOSIS — M25571 Pain in right ankle and joints of right foot: Secondary | ICD-10-CM | POA: Diagnosis not present

## 2016-12-28 DIAGNOSIS — N289 Disorder of kidney and ureter, unspecified: Secondary | ICD-10-CM | POA: Diagnosis not present

## 2016-12-28 DIAGNOSIS — I10 Essential (primary) hypertension: Secondary | ICD-10-CM | POA: Diagnosis not present

## 2016-12-28 DIAGNOSIS — Z8673 Personal history of transient ischemic attack (TIA), and cerebral infarction without residual deficits: Secondary | ICD-10-CM | POA: Diagnosis not present

## 2016-12-28 DIAGNOSIS — E559 Vitamin D deficiency, unspecified: Secondary | ICD-10-CM | POA: Diagnosis not present

## 2016-12-31 LAB — CUP PACEART REMOTE DEVICE CHECK
Implantable Pulse Generator Implant Date: 20170417
MDC IDC SESS DTM: 20180712001205

## 2017-01-21 ENCOUNTER — Ambulatory Visit (INDEPENDENT_AMBULATORY_CARE_PROVIDER_SITE_OTHER): Payer: Medicare Other | Admitting: *Deleted

## 2017-01-21 DIAGNOSIS — Z8673 Personal history of transient ischemic attack (TIA), and cerebral infarction without residual deficits: Secondary | ICD-10-CM

## 2017-01-28 LAB — CUP PACEART REMOTE DEVICE CHECK
Implantable Pulse Generator Implant Date: 20170417
MDC IDC SESS DTM: 20180811003814

## 2017-01-31 ENCOUNTER — Other Ambulatory Visit: Payer: Self-pay | Admitting: Internal Medicine

## 2017-01-31 DIAGNOSIS — I10 Essential (primary) hypertension: Secondary | ICD-10-CM

## 2017-02-21 ENCOUNTER — Ambulatory Visit (INDEPENDENT_AMBULATORY_CARE_PROVIDER_SITE_OTHER): Payer: Medicare Other | Admitting: *Deleted

## 2017-02-21 DIAGNOSIS — I639 Cerebral infarction, unspecified: Secondary | ICD-10-CM | POA: Diagnosis not present

## 2017-02-21 NOTE — Progress Notes (Signed)
Carelink Summary Report / Loop Recorder 

## 2017-02-23 LAB — CUP PACEART REMOTE DEVICE CHECK
Date Time Interrogation Session: 20180910004505
MDC IDC PG IMPLANT DT: 20170417

## 2017-02-28 ENCOUNTER — Encounter: Payer: Medicare Other | Admitting: Internal Medicine

## 2017-03-17 ENCOUNTER — Telehealth: Payer: Self-pay | Admitting: *Deleted

## 2017-03-17 NOTE — Telephone Encounter (Signed)
Call to patient for Geriatric Task Force check.  Patient stated she is doing ok.  Glucose is going ok and that her blood pressure was good as well.  Patient stated she now has a heart monitor on and is doing ok wit that.  Stated she has plenty of medications for her Diabetes and blood pressure.  Was told to call if she needed anything and reminded her of an upcoming appointment in the Clinics in November.  Regino Schultze Kaydon Husby RN 03/17/2017 11:22 AM.

## 2017-03-22 ENCOUNTER — Ambulatory Visit (INDEPENDENT_AMBULATORY_CARE_PROVIDER_SITE_OTHER): Payer: Medicare Other | Admitting: *Deleted

## 2017-03-22 DIAGNOSIS — I639 Cerebral infarction, unspecified: Secondary | ICD-10-CM

## 2017-03-23 NOTE — Progress Notes (Signed)
Carelink Summary Report / Loop Recorder 

## 2017-03-24 ENCOUNTER — Telehealth: Payer: Self-pay | Admitting: Cardiology

## 2017-03-24 LAB — CUP PACEART REMOTE DEVICE CHECK
Date Time Interrogation Session: 20181010013923
MDC IDC PG IMPLANT DT: 20170417

## 2017-03-24 NOTE — Telephone Encounter (Signed)
Spoke with patient.  She reports she cannot afford monthly Carelink monitoring and is not inclined to perform checks every 3 months.  She called to discuss having her loop recorder removed but is not interested in coming into the office to discuss a plan as it means "more bills."  Patient will plan to unplug her Carelink monitor.  She verbalizes understanding that this means her heart rhythm will no longer be monitored for abnormalities.  She is aware to call our office in the future if she wishes to resume Carelink monitoring, or if she wishes to discuss explant.  Routed to Dr. Lovena Le for review.

## 2017-03-24 NOTE — Telephone Encounter (Signed)
Patient called and wanted information about loop recorder removal. Call routed to Device Tech RN.

## 2017-03-26 ENCOUNTER — Telehealth: Payer: Self-pay | Admitting: Internal Medicine

## 2017-03-26 DIAGNOSIS — I10 Essential (primary) hypertension: Secondary | ICD-10-CM

## 2017-03-27 NOTE — Telephone Encounter (Signed)
Unfortunate by I understand. GT

## 2017-03-30 NOTE — Telephone Encounter (Signed)
Patient has an appointment to see Dr. Berneice Gandy on 05/09/2017 at 1:15 pm.

## 2017-04-04 NOTE — Telephone Encounter (Signed)
[facility] requesting call back.  Will advise patient to unplug her Carelink monitor if she wishes to avoid further monthly charges.  Monitor is still updated as of today.

## 2017-04-11 NOTE — Telephone Encounter (Signed)
Spoke with patient.  She unplugged her Carelink monitor.  Advised I will order a monitor return kit for her today that will take a few weeks to get to her.  Patient confirmed home address and verbalizes agreement with plan.  She denies questions or concerns at this time.  Unenrolled from [place], return kit ordered.

## 2017-04-26 IMAGING — MR MR HEAD W/O CM
9 of 12 series · 31 of 48 positions shown · non-contrast
Comparison: CT head August 26, 2015

CLINICAL DATA: Mild dysarthria and expressive aphasia, RIGHT grip
weakness. Symptoms for 30 hours. History of hypertension, diabetes
and sarcoidosis.

EXAM:
MRI HEAD WITHOUT CONTRAST
MRA HEAD WITHOUT CONTRAST
TECHNIQUE: Multiplanar, multiecho pulse sequences of the brain and surrounding
structures were obtained without intravenous contrast. Angiographic
images of the head were obtained using MRA technique without
contrast.

[Series 3: T1 · sagittal · 5.0mm · 0.47mm/px · 1 of 23 slices shown]
[im 1/23]
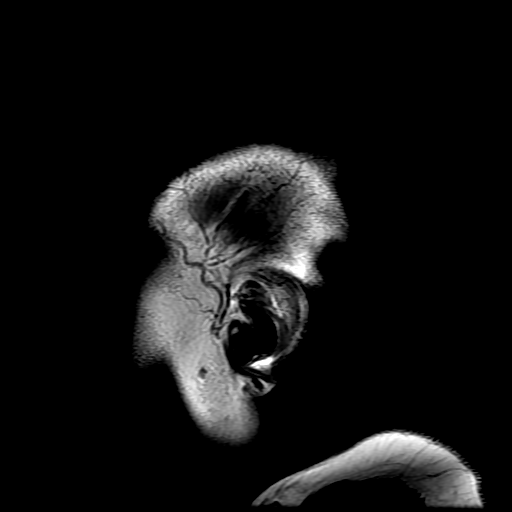

[Series 4: DWI · axial · 3.0mm · 1.09mm/px · z∈[-63,+69]mm · 7 of 90 slices shown (1 of 4)]
[im 1/90]
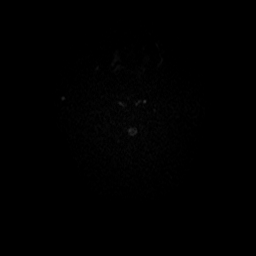
[im 15/90]
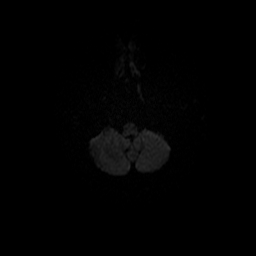
[im 30/90]
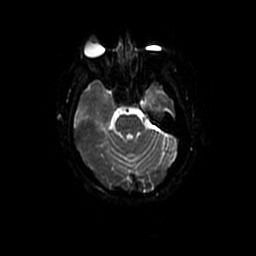
[im 45/90]
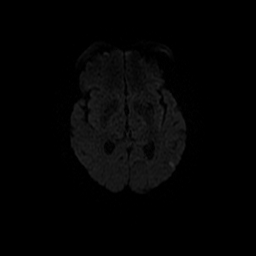
[im 60/90]
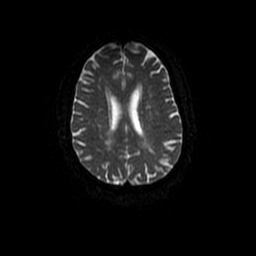
[im 75/90]
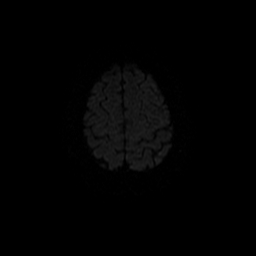
[im 90/90]
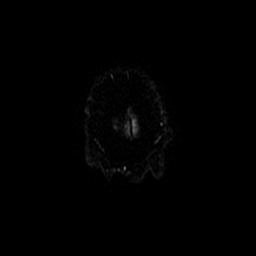

[Series 5: (id) mt fs · axial · 1.4mm · 0.43mm/px · z∈[-62,-6]mm · 5 of 136 slices shown]
[im 1/136]
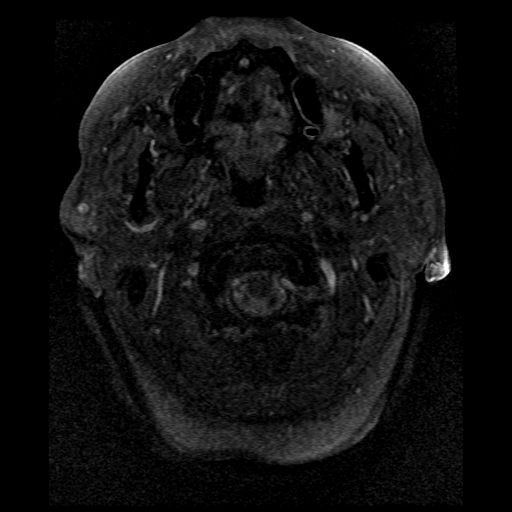
[im 28/136]
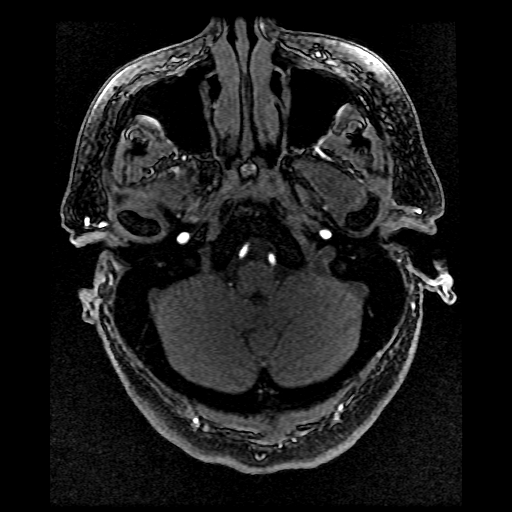
[im 41/136]
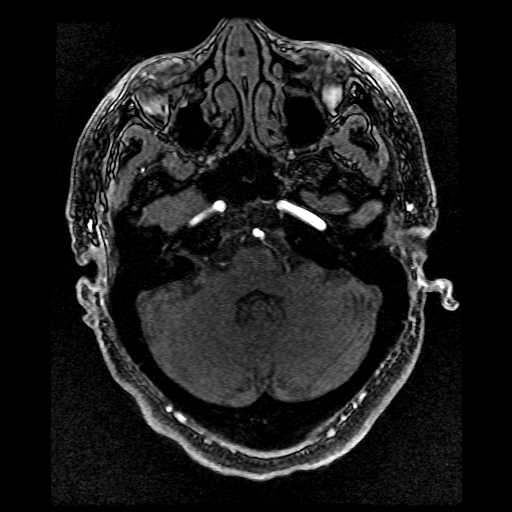
[im 55/136]
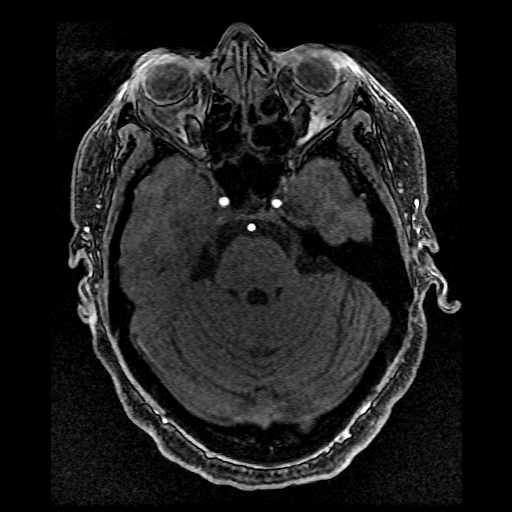
[im 82/136]
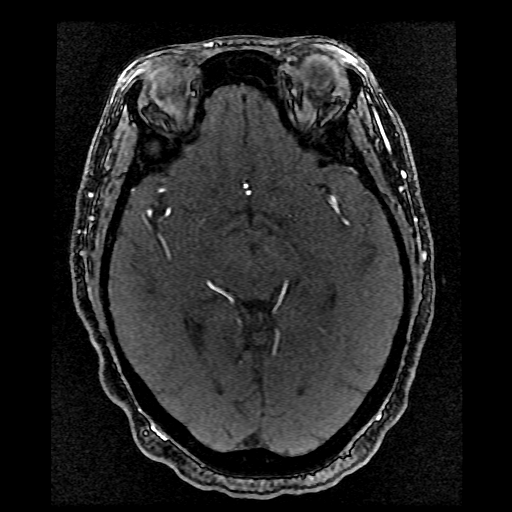

[Series 6: T2 · axial · 5.0mm · 0.45mm/px · z∈[-60,+71]mm · 2 of 23 slices shown (1 of 2)]
[im 1/23]
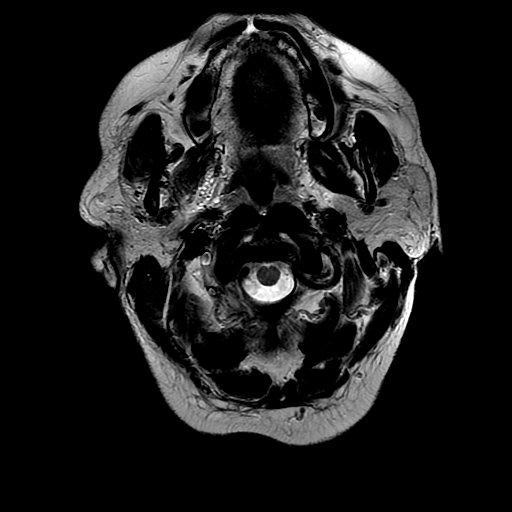
[im 23/23]
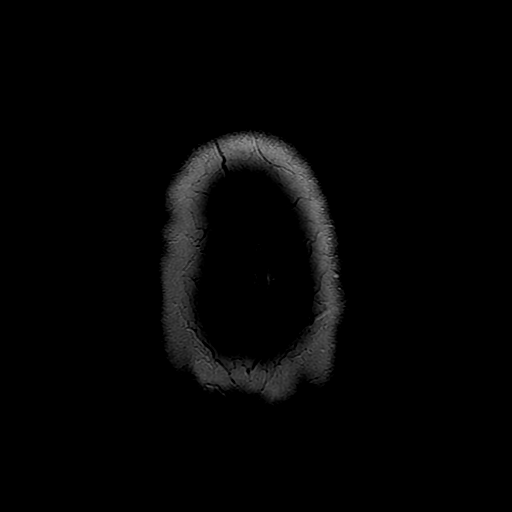

[Series 7: DWI · coronal · 5.0mm · 1.09mm/px · 5 of 66 slices shown (2 of 4)]
[im 1/66]
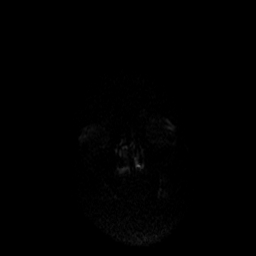
[im 17/66]
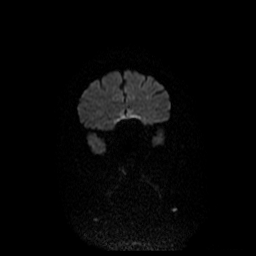
[im 33/66]
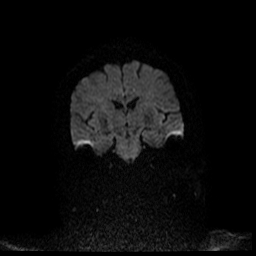
[im 49/66]
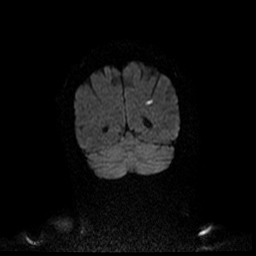
[im 66/66]
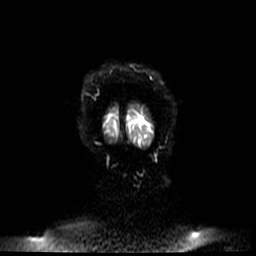

[Series 8: FLAIR · axial · 5.0mm · 0.45mm/px · z∈[-60,+71]mm · 2 of 23 slices shown]
[im 1/23]
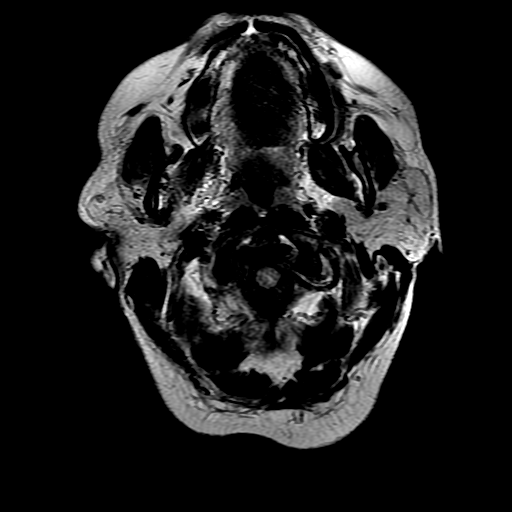
[im 23/23]
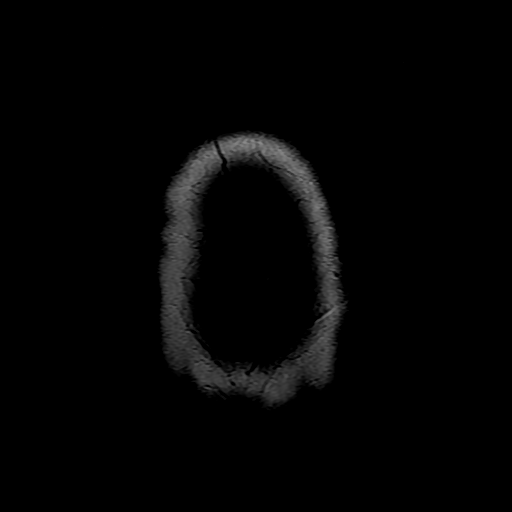

[Series 11: T2 · coronal · 5.0mm · 0.39mm/px · 2 of 25 slices shown (2 of 2)]
[im 1/25]
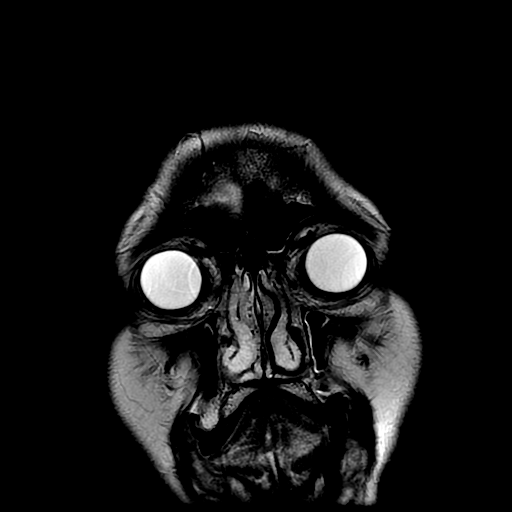
[im 25/25]
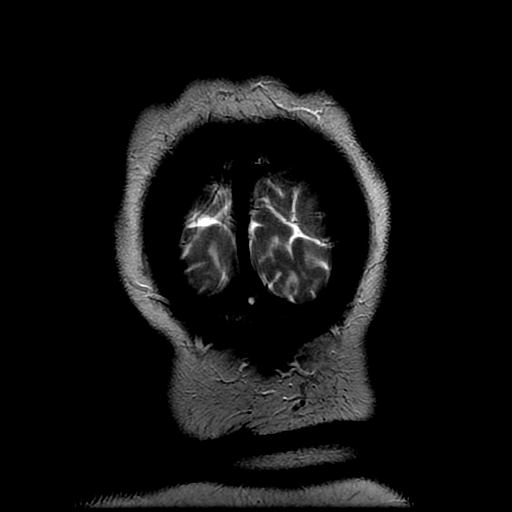

[Series 400: DWI · axial · 3.0mm · 1.09mm/px · z∈[-63,+69]mm · 4 of 45 slices shown (3 of 4)]
[im 1/45]
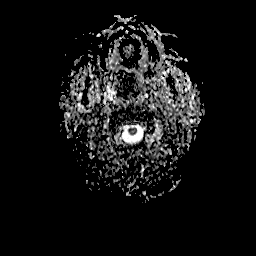
[im 15/45]
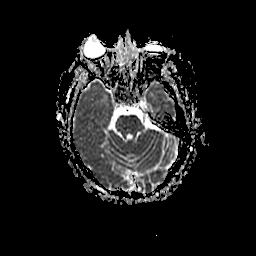
[im 30/45]
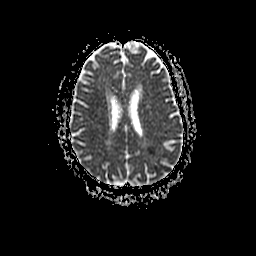
[im 45/45]
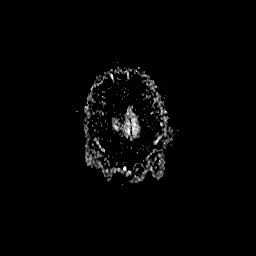

[Series 700: DWI · coronal · 5.0mm · 1.09mm/px · 3 of 33 slices shown (4 of 4)]
[im 1/33]
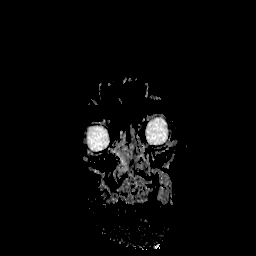
[im 17/33]
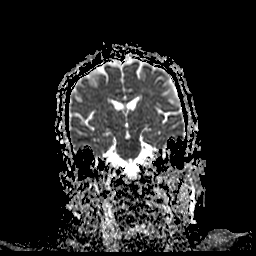
[im 33/33]
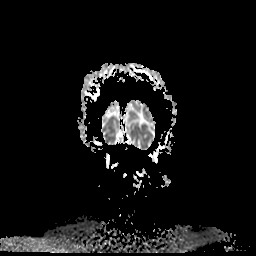

[31 of 48 positions shown; findings below may reference images not displayed]

FINDINGS: MRI HEAD FINDINGS

INTRACRANIAL CONTENTS: Patchy reduced diffusion LEFT parietal lobe
cortex and subcortical white matter. Diffuse subcentimeter foci of
reduced diffusion LEFT temporal lobe and punctate focus of reduced
diffusion LEFT posterior frontal lobe. Lesions show low ADC values
and FLAIR T2 hyperintense signal. Subcentimeter focus of
susceptibility artifact LEFT parietal gyrus. The ventricles and
sulci are normal for patient's age. No suspicious parenchymal
signal, mass lesions, mass effect. Patchy supratentorial white
matter FLAIR T2 hyperintensities. LEFT inferior basal ganglia old
perivascular space , minimal surrounding FLAIR T2 hyperintense
suggests adjacent lacunar infarct. Ventricles and sulci are normal
for patient's age. No abnormal extra-axial fluid collections. No
extra-axial masses though, contrast enhanced sequences would be more
sensitive.

ORBITS: The included ocular globes and orbital contents are
non-suspicious.

SINUSES: Trace paranasal sinus mucosal thickening without air-fluid
levels. Trace mastoid effusions.

SKULL/SOFT TISSUES: No abnormal sellar expansion. No suspicious
calvarial bone marrow signal. Craniocervical junction maintained.

MRA HEAD FINDINGS

Anterior circulation: Normal flow related enhancement of the
included cervical, petrous, cavernous and supraclinoid internal
carotid arteries. Mild stenosis RIGHT para ophthalmic internal
carotid artery, corresponding to calcific atherosclerosis on
yesterday's head CT. Patent anterior communicating artery. Complete
loss of LEFT distal M1 flow related enhancement at trifurcation.
Thready flow related enhancement of the proximal M2 segments.

No high-grade stenosis, abnormal luminal irregularity, aneurysm.

Posterior circulation: Codominant vertebral artery's. Basilar artery
is patent, with normal flow related enhancement of the main branch
vessels. Normal flow related enhancement of the posterior cerebral
arteries.

No large vessel occlusion, high-grade stenosis, abnormal luminal
irregularity, aneurysm.
IMPRESSION: MRI HEAD: Multiple predominately subcentimeter infarcts LEFT
temporal, frontal and parietal lobes and MCA territory. Minimal
petechial hemorrhage LEFT parietal lobe.

Moderate white matter changes compatible with chronic small vessel
ischemic disease.

MRA HEAD: LEFT distal M1 emergent large vessel occlusion. Thready
flow related enhancement of LEFT M2 compatible with early collateral
formation.

Mild stenosis RIGHT para ophthalmic internal carotid artery.

These results will be called to the ordering clinician or
representative by the Radiologist Assistant, and communication
documented in the PACS or zVision Dashboard.

## 2017-05-09 ENCOUNTER — Encounter: Payer: Medicare Other | Admitting: Internal Medicine

## 2017-05-10 ENCOUNTER — Encounter: Payer: Self-pay | Admitting: Internal Medicine

## 2017-06-01 ENCOUNTER — Other Ambulatory Visit: Payer: Self-pay | Admitting: Internal Medicine

## 2017-06-15 ENCOUNTER — Encounter: Payer: Self-pay | Admitting: Internal Medicine

## 2017-07-18 DIAGNOSIS — E113313 Type 2 diabetes mellitus with moderate nonproliferative diabetic retinopathy with macular edema, bilateral: Secondary | ICD-10-CM | POA: Diagnosis not present

## 2017-07-18 DIAGNOSIS — H43813 Vitreous degeneration, bilateral: Secondary | ICD-10-CM | POA: Diagnosis not present

## 2017-07-18 DIAGNOSIS — H35033 Hypertensive retinopathy, bilateral: Secondary | ICD-10-CM | POA: Diagnosis not present

## 2017-07-18 DIAGNOSIS — H3582 Retinal ischemia: Secondary | ICD-10-CM | POA: Diagnosis not present

## 2017-07-25 ENCOUNTER — Encounter: Payer: Self-pay | Admitting: Internal Medicine

## 2017-07-25 ENCOUNTER — Ambulatory Visit (INDEPENDENT_AMBULATORY_CARE_PROVIDER_SITE_OTHER): Payer: Medicare Other | Admitting: Internal Medicine

## 2017-07-25 VITALS — BP 175/81 | HR 75 | Temp 98.5°F | Ht 64.0 in | Wt 233.7 lb

## 2017-07-25 DIAGNOSIS — Z87891 Personal history of nicotine dependence: Secondary | ICD-10-CM

## 2017-07-25 DIAGNOSIS — E1122 Type 2 diabetes mellitus with diabetic chronic kidney disease: Secondary | ICD-10-CM

## 2017-07-25 DIAGNOSIS — E118 Type 2 diabetes mellitus with unspecified complications: Secondary | ICD-10-CM

## 2017-07-25 DIAGNOSIS — Z7984 Long term (current) use of oral hypoglycemic drugs: Secondary | ICD-10-CM

## 2017-07-25 DIAGNOSIS — N183 Chronic kidney disease, stage 3 unspecified: Secondary | ICD-10-CM

## 2017-07-25 DIAGNOSIS — R51 Headache: Secondary | ICD-10-CM | POA: Diagnosis not present

## 2017-07-25 DIAGNOSIS — Z8673 Personal history of transient ischemic attack (TIA), and cerebral infarction without residual deficits: Secondary | ICD-10-CM | POA: Diagnosis not present

## 2017-07-25 DIAGNOSIS — E785 Hyperlipidemia, unspecified: Secondary | ICD-10-CM

## 2017-07-25 DIAGNOSIS — Z79899 Other long term (current) drug therapy: Secondary | ICD-10-CM

## 2017-07-25 DIAGNOSIS — I10 Essential (primary) hypertension: Secondary | ICD-10-CM

## 2017-07-25 DIAGNOSIS — I129 Hypertensive chronic kidney disease with stage 1 through stage 4 chronic kidney disease, or unspecified chronic kidney disease: Secondary | ICD-10-CM | POA: Diagnosis present

## 2017-07-25 DIAGNOSIS — E113299 Type 2 diabetes mellitus with mild nonproliferative diabetic retinopathy without macular edema, unspecified eye: Secondary | ICD-10-CM | POA: Diagnosis not present

## 2017-07-25 LAB — GLUCOSE, CAPILLARY: Glucose-Capillary: 155 mg/dL — ABNORMAL HIGH (ref 65–99)

## 2017-07-25 LAB — POCT GLYCOSYLATED HEMOGLOBIN (HGB A1C): Hemoglobin A1C: 10.1

## 2017-07-25 MED ORDER — HYDROCHLOROTHIAZIDE 25 MG PO TABS: 25.0000 mg | ORAL_TABLET | Freq: Every day | ORAL | 1 refills | Status: DC

## 2017-07-25 MED ORDER — GLIPIZIDE 5 MG PO TABS: 5.0000 mg | ORAL_TABLET | Freq: Two times a day (BID) | ORAL | 1 refills | Status: DC

## 2017-07-25 NOTE — Patient Instructions (Signed)
Thank you for seeing [place] in the clinic today!  You were evaluated for diabetes and high blood pressure. Your blood pressure was very high in clinic today and I recommended that we change your blood pressure regimen. You did not want to take any new medications for your high blood pressure or diabetes during today's visit. I strongly suggest you return to the clinic in 3 months to revisit your daily medications used to control your chronic medical conditions.  Please return to the clinic in 3 months for follow up of your chronic medical conditions.  If you have any questions or concerns, please call our clinic at [phone] between the hours of 9am-5pm. If you have a problem after these hours, please call [phone] and ask for the internal medicine resident on call. If you feel you are having a medical emergency please call 911.   Thanks, Dr. Larena Glassman Vlada Uriostegui

## 2017-07-25 NOTE — Assessment & Plan Note (Addendum)
Patient's A1C today 10.1%, increased from 01/2016 value of 8.0%. Patient refuses to make changes to current medication regimen such as adding metformin or sitagliptin (both medications offered during today's visit). Patient wants to try taking herbal remedies she saw on TV to decrease A1C and "cure" diabetes. Patient counseled on risk of kidney damage and stroke/heart attack in patients with uncontrolled diabetes. Personally calculated ACC/AHA ASCVD risk with patient in room. Described 44.8% risk of adverse event in 10 years with current BP, cholesterol, and DM conditions. Patient aware of risks and does not want to start new medication at this time.    Plan: -Continue glipizide 5 mg BID, refills provided -A1C at follow up, consider medication change at that time -UPC today -RTC in 3 monnths

## 2017-07-25 NOTE — Assessment & Plan Note (Addendum)
Patient's BP elevated >170/80 on two separate readings during today's visit. Explained that patient needs better BP control and that worsening control likely causing headaches and lower extremity swelling. Given diabetes, patient should be on ACEi or ARB. Patient reluctant to start taking these medications, citing "massive government recall" of lisinopril as her reason to refuse all ACE inhibitors and ARBs. Patient also reluctant to start amlodipine or BB at this time - does not cite reason for these medication refusals. Will not make change to current diuretic for fear of worsening headaches.  Patient counseled on risk of kidney damage and stroke/heart attack in patients with uncontrolled hypertension. Again, personally calculated ACC/AHA ASCVD risk with patient in room. Described 44.8% risk of adverse event in 10 years with current BP, cholesterol, and DM conditions. Patient aware of risks and does not want to start new medication at this time.    Plan: -Continue HCTZ 25 mg, refills provided -RTC in 3 months for BP recheck -BMP today to assess Cr  Addendum: Patient's electrolytes within normal limits, GFR stable >30, with Cr 1.6. Urinalysis shows proteinuria. Patient adamantly doesn't want to start ACEi/ARB at this time, even when the benefits of starting these medications for renal protection were outlined. Will encourage this at next appointment in 3 months.

## 2017-07-25 NOTE — Progress Notes (Signed)
   CC: HTN follow up  HPI:  [name] is a 67 y.o. with PMH of T2DM, HTN, HLD, and prior CVA who presents for follow up of chronic medical conditions. Patient last seen on 02/02/2016 in clinic for management of chronic medical conditions. At that time the patient's BP was well controlled on HCTZ 25 mg. Her A1C was elevated to 8.0 but patient did not want to make any changes in medications at that time. She was also recommended to take a statin for HLD and further stroke prevention, but she declined to take any statin medications at that time.   Since her last visit the patient has experienced intermittent frontal headaches without nausea, vomiting, photophobia and changes in vision. She stopped taking HCTZ for awhile because she thought this was what caused her headaches, however headaches persisted. Patient now thinks that headaches are related to salt intake, because they have been getting worse recently with increased salty-foods in her diet. Patient also notes intermittent lower extremity swelling which she states she has never experienced before. LE swelling resolves overnight. Patient experiences intermittent palpitations, no chest pain, dizziness, or focal weakness/numbness.  Past Medical History: Past Medical History:  Diagnosis Date  . Diabetes mellitus   . Hypertension   . Sarcoidosis   . Stroke [facility])    Review of Systems:   Patient endorses headaches and lower extremity swelling , as per HPI Patient denies chest pain, shortness of breath, abdominal pain, diaphoresis, nausea/vomiting, and change in bowel/bladder habits.  Physical Exam:  Vitals:   07/25/17 1550  BP: (!) 180/79  Pulse: 88  Temp: 98.5 F (36.9 C)  TempSrc: Oral  SpO2: 99%  Weight: 233 lb 11.2 oz (106 kg)  Height: 5\' 4"  (1.626 m)    Physical Exam  Constitutional: She appears well-developed and well-nourished. No distress.  HENT:  Mouth/Throat: Oropharynx is clear and moist.  Cardiovascular: Normal  rate, regular rhythm and intact distal pulses. Exam reveals no friction rub.  No murmur heard. Respiratory: Effort normal. No respiratory distress. She has no wheezes. She has no rales.  GI: Soft. Bowel sounds are normal. She exhibits no distension. There is no tenderness. There is no rebound.  Musculoskeletal: She exhibits edema (Trace pitting edema around ankles bilaterally). She exhibits no tenderness.  Skin: Skin is warm and dry. No rash noted. She is not diaphoretic. No erythema.   Assessment & Plan:   See Encounters Tab for problem based charting.  Patient discussed with Dr. Dareen Piano.

## 2017-07-25 NOTE — Assessment & Plan Note (Signed)
Last Cr 01/2016 = 1.65, eGFR of 37. CKD stage 3 at that time. Given uncontrolled HTN and DM, I am concerned about progression of known CKD. Patient continues to endorse good urine output, which is reassuring. Her swelling is worrisome, though, for disease progression and will test accordingly. Recommended tighter control of HTN and DM for patient to prevent advancement of CKD, but patient declined suggested changes in medication regimen at this time stating that she would like to try natural remedies to cure diabetes and hypertension instead.  Plan: -BMP today -RTC in 3 months

## 2017-07-26 LAB — BMP8+ANION GAP
Anion Gap: 16 mmol/L (ref 10.0–18.0)
BUN/Creatinine Ratio: 14 (ref 12–28)
BUN: 23 mg/dL (ref 8–27)
CO2: 24 mmol/L (ref 20–29)
Calcium: 9.5 mg/dL (ref 8.7–10.3)
Chloride: 98 mmol/L (ref 96–106)
Creatinine, Ser: 1.6 mg/dL — ABNORMAL HIGH (ref 0.57–1.00)
GFR calc Af Amer: 38 mL/min/{1.73_m2} — ABNORMAL LOW (ref >59)
GFR calc non Af Amer: 33 mL/min/{1.73_m2} — ABNORMAL LOW (ref >59)
Glucose: 176 mg/dL — ABNORMAL HIGH (ref 65–99)
Potassium: 4.8 mmol/L (ref 3.5–5.2)
Sodium: 138 mmol/L (ref 134–144)

## 2017-07-26 LAB — MICROALBUMIN / CREATININE URINE RATIO
Creatinine, Urine: 77.9 mg/dL
Microalb/Creat Ratio: 271.8 mg/g creat — ABNORMAL HIGH (ref 0.0–30.0)
Microalbumin, Urine: 211.7 ug/mL

## 2017-07-27 NOTE — Progress Notes (Signed)
Internal Medicine Clinic Attending  Case discussed with Dr. Nedrud at the time of the visit.  We reviewed the resident's history and exam and pertinent patient test results.  I agree with the assessment, diagnosis, and plan of care documented in the resident's note.  

## 2017-08-01 ENCOUNTER — Other Ambulatory Visit: Payer: Self-pay | Admitting: Internal Medicine

## 2017-08-01 DIAGNOSIS — E113299 Type 2 diabetes mellitus with mild nonproliferative diabetic retinopathy without macular edema, unspecified eye: Secondary | ICD-10-CM

## 2017-08-01 MED ORDER — GLUCOSE BLOOD VI STRP: ORAL_STRIP | 12 refills | Status: DC

## 2017-08-01 NOTE — Telephone Encounter (Signed)
Refill Request  glucose blood test strip

## 2017-10-24 ENCOUNTER — Ambulatory Visit (INDEPENDENT_AMBULATORY_CARE_PROVIDER_SITE_OTHER): Payer: Medicare Other | Admitting: Internal Medicine

## 2017-10-24 ENCOUNTER — Encounter (INDEPENDENT_AMBULATORY_CARE_PROVIDER_SITE_OTHER): Payer: Self-pay

## 2017-10-24 ENCOUNTER — Encounter: Payer: Self-pay | Admitting: Internal Medicine

## 2017-10-24 VITALS — BP 158/77 | HR 87 | Temp 99.8°F | Ht 64.0 in | Wt 232.8 lb

## 2017-10-24 DIAGNOSIS — Z79899 Other long term (current) drug therapy: Secondary | ICD-10-CM

## 2017-10-24 DIAGNOSIS — Z87891 Personal history of nicotine dependence: Secondary | ICD-10-CM

## 2017-10-24 DIAGNOSIS — Z7982 Long term (current) use of aspirin: Secondary | ICD-10-CM | POA: Diagnosis not present

## 2017-10-24 DIAGNOSIS — E1122 Type 2 diabetes mellitus with diabetic chronic kidney disease: Secondary | ICD-10-CM

## 2017-10-24 DIAGNOSIS — Z8673 Personal history of transient ischemic attack (TIA), and cerebral infarction without residual deficits: Secondary | ICD-10-CM

## 2017-10-24 DIAGNOSIS — E118 Type 2 diabetes mellitus with unspecified complications: Secondary | ICD-10-CM

## 2017-10-24 DIAGNOSIS — Z1231 Encounter for screening mammogram for malignant neoplasm of breast: Secondary | ICD-10-CM

## 2017-10-24 DIAGNOSIS — M549 Dorsalgia, unspecified: Secondary | ICD-10-CM | POA: Diagnosis not present

## 2017-10-24 DIAGNOSIS — M25569 Pain in unspecified knee: Secondary | ICD-10-CM | POA: Diagnosis not present

## 2017-10-24 DIAGNOSIS — Z7984 Long term (current) use of oral hypoglycemic drugs: Secondary | ICD-10-CM | POA: Diagnosis not present

## 2017-10-24 DIAGNOSIS — I129 Hypertensive chronic kidney disease with stage 1 through stage 4 chronic kidney disease, or unspecified chronic kidney disease: Secondary | ICD-10-CM

## 2017-10-24 DIAGNOSIS — N189 Chronic kidney disease, unspecified: Secondary | ICD-10-CM | POA: Diagnosis not present

## 2017-10-24 DIAGNOSIS — G8929 Other chronic pain: Secondary | ICD-10-CM

## 2017-10-24 DIAGNOSIS — I10 Essential (primary) hypertension: Secondary | ICD-10-CM

## 2017-10-24 DIAGNOSIS — M25559 Pain in unspecified hip: Secondary | ICD-10-CM

## 2017-10-24 LAB — GLUCOSE, CAPILLARY: GLUCOSE-CAPILLARY: 170 mg/dL — AB (ref 65–99)

## 2017-10-24 LAB — POCT GLYCOSYLATED HEMOGLOBIN (HGB A1C): Hemoglobin A1C: 9.1

## 2017-10-24 MED ORDER — GLIPIZIDE 5 MG PO TABS: 5.0000 mg | ORAL_TABLET | Freq: Two times a day (BID) | ORAL | 1 refills | Status: DC

## 2017-10-24 MED ORDER — ASPIRIN EC 81 MG PO TBEC: 81.0000 mg | DELAYED_RELEASE_TABLET | Freq: Every day | ORAL | 2 refills | Status: AC

## 2017-10-24 MED ORDER — HYDROCHLOROTHIAZIDE 25 MG PO TABS: 25.0000 mg | ORAL_TABLET | Freq: Every day | ORAL | 1 refills | Status: DC

## 2017-10-24 NOTE — Progress Notes (Signed)
   CC: HTN, DM follow up  HPI:  Wendy [name] is a 67 y.o. with PMH of stroke, HTN, and diabetes. Patient presents for management of chronic medical conditions. Since last visit in 07/2017 patient has felt well and has no acute complaints. She has been overall trying to eat a low sugar diet and loose weight. She has been compliant with daily medication regimen of HCTZ and glipizide.   Past Medical History: Past Medical History:  Diagnosis Date  . Diabetes mellitus   . Hypertension   . Sarcoidosis   . Stroke [facility])    Review of Systems:   Patient endorses chronic knee, hip, and back pain Patient denies chest pain, shortness of breath, abdominal pain, diaphoresis, nausea/vomiting, lower extremity swelling, and change in bowel/bladder habits.  Physical Exam:  Vitals:   10/24/17 1356  BP: (!) 158/77  Pulse: 87  Temp: 99.8 F (37.7 C)  TempSrc: Oral  SpO2: 95%  Weight: 232 lb 12.8 oz (105.6 kg)  Height: 5\' 4"  (1.626 m)   Physical Exam  Constitutional: She appears well-developed and well-nourished. No distress.  HENT:  Mouth/Throat: Oropharynx is clear and moist.  Eyes: Conjunctivae and EOM are normal.  Cardiovascular: Normal rate, regular rhythm and intact distal pulses.  Respiratory: Effort normal and breath sounds normal. No respiratory distress. She has no wheezes. She has no rales.  GI: Bowel sounds are normal. She exhibits no distension. There is no tenderness.  Musculoskeletal: She exhibits no edema (of bilateral lower extremities) or tenderness (of bilateral lower extremities).  Skin: Skin is warm and dry. No rash noted. She is not diaphoretic. No erythema.  Psychiatric: She has a normal mood and affect. Her behavior is normal. Thought content normal.   Assessment & Plan:   See Encounters Tab for problem based charting.  Patient discussed with Dr. Dareen Piano.

## 2017-10-24 NOTE — Assessment & Plan Note (Signed)
Patient currently taking daily aspirin 81 mg only. Patient needs extensive risk factor modification, given uncontrolled DM and HTN. Patient managing with minimal medications and diet/lifestyle modifications. After discussion regarding risk of recurrent stroke, patient also refusing statin therapy today. Advised patient about benefits of this medication, but deferred prescription because of worry for side effects.

## 2017-10-24 NOTE — Assessment & Plan Note (Signed)
Patient's BP elevated above goal.  Given CKD, patient counseled that she would benefit from lisinopril added to daily regimen of HCTZ. Discussed this and other medications which could be used to better regulate BP, prevent progression of CKD, and reduce risk of future stroke. Patient will not agree to start additional medication at this time, stating the she does not want to risk the side effects of these medications. Patient counseled again about risk of progression to CKD and eventually requiring dialysis (or repeat stroke) however patient does not think medication will prevent these adverse events.  Plan: -Continue HCTZ 25 mg daily -Encourage lisinopril, additional agent at follow up -Diet and lifestyle modifications for further management -RTC in 3-6 months for follow up

## 2017-10-24 NOTE — Patient Instructions (Signed)
Thank you for seeing [place] in the clinic today!  You were evaluated for high blood pressure, diabetes, and chronic kidney disease. Please continue taking all medications as prescribed.  For your high blood pressure, we recommend you consider an ACE inhibitor such as lisinopril to protect your kidneys. Please consider this at follow up.  For your diabetes, we recommend taking an additional oral medication to control your diabetes. Since you are doing well with modifying your diet, we recommend that you continue this work with our diabetes educator in the clinic. A referral for this appointment was placed in clinic today.  You were given a referral for mammogram and for the diabetes educator.  Please return to the clinic in 3 months for follow up of your chronic medical conditions.   If you have any questions or concerns, please call our clinic at [phone] between the hours of 9am-5pm. If you have a problem after these hours, please call [phone] and ask for the internal medicine resident on call. If you feel you are having a medical emergency please call 911.   Thanks, Dr. Larena Glassman Jeselle Hiser

## 2017-10-24 NOTE — Assessment & Plan Note (Signed)
Patient has not had screening mammogram since 2014. Referral placed today. Patient agreeable  Plan: -Screening mammogram

## 2017-10-24 NOTE — Assessment & Plan Note (Addendum)
A1C elevated to 9.1% today, which is improved from elevated A1C at last visit 10.1% in 07/2017 but still above goal. Patient does not want to start oral medications or insulin for diabetes, even after counseling about the risks of progression of CKD and stroke in uncontrolled diabetes. Patient currently on glipizide 5 mg BID and refusing to take additional medications in spite of counseling. Patient is willing, however, to speak to the diabetes educator today. Will place this referral for improved A1C management.  Plan: -Diabetes educator referral -Continue glipized 5 mg BID -RTC in 3 months for A1C check

## 2017-10-26 NOTE — Progress Notes (Signed)
Internal Medicine Clinic Attending  Case discussed with Dr. Nedrud at the time of the visit.  We reviewed the resident's history and exam and pertinent patient test results.  I agree with the assessment, diagnosis, and plan of care documented in the resident's note.  

## 2017-11-28 ENCOUNTER — Encounter (INDEPENDENT_AMBULATORY_CARE_PROVIDER_SITE_OTHER): Payer: Self-pay

## 2017-11-28 ENCOUNTER — Ambulatory Visit (INDEPENDENT_AMBULATORY_CARE_PROVIDER_SITE_OTHER): Payer: Medicare Other | Admitting: Dietician

## 2017-11-28 DIAGNOSIS — Z6839 Body mass index (BMI) 39.0-39.9, adult: Secondary | ICD-10-CM

## 2017-11-28 DIAGNOSIS — Z7984 Long term (current) use of oral hypoglycemic drugs: Secondary | ICD-10-CM | POA: Diagnosis not present

## 2017-11-28 DIAGNOSIS — E119 Type 2 diabetes mellitus without complications: Secondary | ICD-10-CM | POA: Diagnosis not present

## 2017-11-28 DIAGNOSIS — E118 Type 2 diabetes mellitus with unspecified complications: Secondary | ICD-10-CM

## 2017-11-28 DIAGNOSIS — Z713 Dietary counseling and surveillance: Secondary | ICD-10-CM | POA: Diagnosis not present

## 2017-11-28 LAB — GLUCOSE, CAPILLARY: Glucose-Capillary: 237 mg/dL — ABNORMAL HIGH (ref 65–99)

## 2017-11-28 NOTE — Progress Notes (Signed)
  Medical Nutrition Therapy:  Appt start time: 1330 end time:  1426. Visit # 1  Assessment:  Primary concerns today: diabetes and ? Diet for kidneys Circe says she is here for her diabetes, but also mentions kidney diet for her and her spouse. She says she has decreased A1C recently 1% by limiting her bread intake to 0-1 slice per day. She says this was difficult for her. I commended her for this achievement.  Her activity is what she gets caring for her grandchildren. She reports knee and low back pain limit her from doing more activity. She also reports memory problems.  SLEEP:reports poor sleep- bedtime midnight, awake time 4AM reads on phone or plays games for a few hours, then gets up about 6-9 am MEDICATIONS: glipizide 5mg  bid, takes in the PM with  meals BLOOD SUGAR:238 fasting today, she does not check her blood sugar at home. She did not bring her meter and says she does not like doing this activity Preferred Learning Style: No preference indicated  Learning Readiness: Contemplating  ANTHROPOMETRICS: Estimated body mass index is 39.96 kg/m as calculated from the following:   Height as of 10/24/17: 5\' 4"  (1.626 m).   Weight as of 10/24/17: 232 lb 12.8 oz (105.6 kg).  WEIGHT HISTORY:  Wt Readings from Last 3 Encounters:  10/24/17 232 lb 12.8 oz (105.6 kg)  07/25/17 233 lb 11.2 oz (106 kg)  02/02/16 238 lb 1.6 oz (108 kg)   Lab Results  Component Value Date   HGBA1C 9.1 10/24/2017   BMP Latest Ref Rng & Units 07/25/2017 02/02/2016 09/29/2015  Glucose 65 - 99 mg/dL 176(H) 148(H) 154(H)  BUN 8 - 27 mg/dL 23 42(H) 28(H)  Creatinine 0.57 - 1.00 mg/dL 1.60(H) 1.65(H) 1.41(H)  BUN/Creat Ratio 12 - 28 14 25  -  Sodium 134 - 144 mmol/L 138 140 141  Potassium 3.5 - 5.2 mmol/L 4.8 5.0 4.4  Chloride 96 - 106 mmol/L 98 100 108  CO2 20 - 29 mmol/L 24 24 23   Calcium 8.7 - 10.3 mg/dL 9.5 9.3 9.2   DIETARY INTAKE: Usual eating pattern includes 2 meals and 0-1 snacks per day. Everyday foods  include beans with fat back.  Avoided foods include milk and dairy.   24-hr recall: poor historian for food recall Fasts until  L ( 230 PM): beans, eggs, cheese toast x1 Snk ( PM): sometimes D ( ~6 PM): father's day dinner at [employer]-2 rolls, 6 fried chicken strips, salad, sweet potate with butter on a roll and sweet potato Snk ( PM): chips, fruit Beverages: water, some coffee, sometimes adds flavoring or lemon  Progress Towards Goal(s):  In progress.   Nutritional Diagnosis:  NB-1.1 Food and nutrition-related knowledge deficit As related to lack of sufficient diabetes training .  As evidenced by her report that most of her diabetes training was with her spouse for him, not for herself.  .    Intervention:  Nutrition education about what an A1C is/means, self monitoring options, diabetes medications and their actions.  Coordination of care: suggest professional CGM (patient not ready to commit), additional or alternative diabetes medication per patient desire Teaching Method Utilized: Visual,Auditory,Hands on Handouts given during visit include:diabetes medications and their action, avs, professional CGM Barriers to learning/adherence to lifestyle change: competing values Demonstrated degree of understanding via:  Teach Back   Monitoring/Evaluation:  Dietary intake, exercise, meter and supplies, and body weight in 2 week(s). Debera Lat, RD 11/28/2017 3:30 PM.

## 2017-11-28 NOTE — Patient Instructions (Addendum)
Today we talked about  Your A1C being an average blood sugar of about 215 mg/dl   Professional Continuous glucose monitoring is covered by medicare 4 times a year.    Please bring your meter and supplies and we can meet in a few weeks.   Call me if this does not work for you.  Butch Penny [phone]

## 2017-11-30 ENCOUNTER — Encounter: Payer: Self-pay | Admitting: *Deleted

## 2017-12-12 ENCOUNTER — Encounter: Payer: Self-pay | Admitting: Dietician

## 2017-12-12 ENCOUNTER — Telehealth: Payer: Self-pay | Admitting: Dietician

## 2017-12-12 ENCOUNTER — Ambulatory Visit (INDEPENDENT_AMBULATORY_CARE_PROVIDER_SITE_OTHER): Payer: Medicare Other | Admitting: Dietician

## 2017-12-12 DIAGNOSIS — Z713 Dietary counseling and surveillance: Secondary | ICD-10-CM | POA: Diagnosis not present

## 2017-12-12 DIAGNOSIS — Z7984 Long term (current) use of oral hypoglycemic drugs: Secondary | ICD-10-CM | POA: Diagnosis not present

## 2017-12-12 DIAGNOSIS — E118 Type 2 diabetes mellitus with unspecified complications: Secondary | ICD-10-CM

## 2017-12-12 MED ORDER — GLUCOSE BLOOD VI STRP: ORAL_STRIP | 5 refills | Status: DC

## 2017-12-12 MED ORDER — ONETOUCH DELICA LANCETS FINE MISC: 5 refills | Status: DC

## 2017-12-12 NOTE — Progress Notes (Signed)
  Medical Nutrition Therapy:  Appt start time: 0867 end time:  6195. Visit # 2  Assessment:  Primary concerns today: diabetes and ? Diet for kidneys Theo says "I'm tired" she is not interested in either CGM at this time. She is thanking about restarting the keto diet and also thinking about starting water exercise. She brought in two boxes of glucometer supplies most were out of date and no meters. She did not want to weigh today. Her diet sounds reasonable and her knee pain and care of her grandchildren is limiting her ability to go to water exercise. Suggest simplest (consider er glipizide for example) most effective diabetes medicine to bring her blood sugars to target range.    MEDICATIONS: glipizide 5mg  bid, cannot remember the last time she took it BLOOD SUGAR:252 1 hour after 3 peanut butter crackers  Lab Results  Component Value Date   HGBA1C 9.1 10/24/2017    Progress Towards Goal(s):  In progress.   Nutritional Diagnosis:  NB-1.1 Food and nutrition-related knowledge deficit As related to lack of sufficient diabetes training improved As evidenced by her report that she feels better having a meter and knowing how to use it.     Intervention:  Nutrition education about how to use her new sample one touch meter and how and why to record the numbers she gets from it.   Coordination of care:  Request diabetes testing supplies, alternative diabetes medication to assist with weight loss and least complex (for example trulicity or [id number]) or per patient desire Teaching Method Utilized: Visual,Auditory,Hands on Handouts given during visit include:sample glucometer, info on swimming at [employer], diabetes group meeting, avs, professional CGM Barriers to learning/adherence to lifestyle change: competing values Demonstrated degree of understanding via:  Teach Back   Monitoring/Evaluation:  Dietary intake, exercise, meter and supplies, and body weight in 4 week(s). Debera Lat,  RD 12/12/2017 2:50 PM.

## 2017-12-12 NOTE — Telephone Encounter (Signed)
Request supplies for new sample glucometer

## 2017-12-12 NOTE — Patient Instructions (Addendum)
Your A1C is due again August 13 or after .   Your new doctor is Dr. Gwinda Passe.   You can probably stop up frton and make that appointment with her.

## 2017-12-20 ENCOUNTER — Ambulatory Visit: Payer: Medicare Other

## 2017-12-28 ENCOUNTER — Telehealth: Payer: Self-pay | Admitting: Internal Medicine

## 2017-12-28 DIAGNOSIS — M1712 Unilateral primary osteoarthritis, left knee: Secondary | ICD-10-CM

## 2017-12-28 NOTE — Telephone Encounter (Signed)
Patient is requesting a 4 wheel Rollator Walker to help her get around due to her knee  And back pain.  Patient has already checked with [facility] and she does qualify for this request if approved by her PCP due to her current DX.  Please Advise if a DME order can be placed.

## 2018-01-08 DIAGNOSIS — S91201A Unspecified open wound of right great toe with damage to nail, initial encounter: Secondary | ICD-10-CM | POA: Diagnosis not present

## 2018-01-08 DIAGNOSIS — Y939 Activity, unspecified: Secondary | ICD-10-CM | POA: Diagnosis not present

## 2018-01-08 DIAGNOSIS — R030 Elevated blood-pressure reading, without diagnosis of hypertension: Secondary | ICD-10-CM | POA: Diagnosis not present

## 2018-01-08 DIAGNOSIS — X58XXXA Exposure to other specified factors, initial encounter: Secondary | ICD-10-CM | POA: Diagnosis not present

## 2018-01-08 DIAGNOSIS — E119 Type 2 diabetes mellitus without complications: Secondary | ICD-10-CM | POA: Diagnosis not present

## 2018-01-09 NOTE — Telephone Encounter (Signed)
I have placed this DME order. Thank you.

## 2018-01-18 ENCOUNTER — Other Ambulatory Visit: Payer: Self-pay | Admitting: Dietician

## 2018-01-18 DIAGNOSIS — E118 Type 2 diabetes mellitus with unspecified complications: Secondary | ICD-10-CM

## 2018-01-18 MED ORDER — ACCU-CHEK GUIDE W/DEVICE KIT: 1.0000 | PACK | Freq: Two times a day (BID) | 1 refills | Status: DC

## 2018-01-18 MED ORDER — GLUCOSE BLOOD VI STRP
ORAL_STRIP | 5 refills | Status: DC
Start: 2018-01-18 — End: 2018-10-09

## 2018-01-18 MED ORDER — ACCU-CHEK FASTCLIX LANCETS MISC: 5 refills | Status: DC

## 2018-01-18 NOTE — Telephone Encounter (Signed)
[facility] sends fax requesting prescriptions for accu chek or truemetrix meters per insurance preference.

## 2018-01-18 NOTE — Telephone Encounter (Signed)
Left patient a voicemail letting her know the reason for changing her meter and supplies

## 2018-02-10 DIAGNOSIS — Z8673 Personal history of transient ischemic attack (TIA), and cerebral infarction without residual deficits: Secondary | ICD-10-CM | POA: Diagnosis not present

## 2018-02-10 DIAGNOSIS — N289 Disorder of kidney and ureter, unspecified: Secondary | ICD-10-CM | POA: Diagnosis not present

## 2018-02-10 DIAGNOSIS — E11649 Type 2 diabetes mellitus with hypoglycemia without coma: Secondary | ICD-10-CM | POA: Diagnosis not present

## 2018-02-10 DIAGNOSIS — I10 Essential (primary) hypertension: Secondary | ICD-10-CM | POA: Diagnosis not present

## 2018-02-10 DIAGNOSIS — E785 Hyperlipidemia, unspecified: Secondary | ICD-10-CM | POA: Diagnosis not present

## 2018-02-10 DIAGNOSIS — L309 Dermatitis, unspecified: Secondary | ICD-10-CM | POA: Diagnosis not present

## 2018-02-10 DIAGNOSIS — Z0000 Encounter for general adult medical examination without abnormal findings: Secondary | ICD-10-CM | POA: Diagnosis not present

## 2018-02-10 DIAGNOSIS — E559 Vitamin D deficiency, unspecified: Secondary | ICD-10-CM | POA: Diagnosis not present

## 2018-02-21 DIAGNOSIS — E559 Vitamin D deficiency, unspecified: Secondary | ICD-10-CM | POA: Diagnosis not present

## 2018-02-21 DIAGNOSIS — N183 Chronic kidney disease, stage 3 (moderate): Secondary | ICD-10-CM | POA: Diagnosis not present

## 2018-02-21 DIAGNOSIS — L309 Dermatitis, unspecified: Secondary | ICD-10-CM | POA: Diagnosis not present

## 2018-02-21 DIAGNOSIS — M2550 Pain in unspecified joint: Secondary | ICD-10-CM | POA: Diagnosis not present

## 2018-02-21 DIAGNOSIS — I10 Essential (primary) hypertension: Secondary | ICD-10-CM | POA: Diagnosis not present

## 2018-02-21 DIAGNOSIS — E11649 Type 2 diabetes mellitus with hypoglycemia without coma: Secondary | ICD-10-CM | POA: Diagnosis not present

## 2018-02-21 DIAGNOSIS — Z8673 Personal history of transient ischemic attack (TIA), and cerebral infarction without residual deficits: Secondary | ICD-10-CM | POA: Diagnosis not present

## 2018-02-21 DIAGNOSIS — E785 Hyperlipidemia, unspecified: Secondary | ICD-10-CM | POA: Diagnosis not present

## 2018-03-09 ENCOUNTER — Other Ambulatory Visit: Payer: Self-pay

## 2018-03-09 ENCOUNTER — Ambulatory Visit (HOSPITAL_COMMUNITY)
Admission: RE | Admit: 2018-03-09 | Discharge: 2018-03-09 | Disposition: A | Discharge status: Home / Self Care | Payer: Medicare Other | Source: Ambulatory Visit | Attending: Internal Medicine | Admitting: Internal Medicine

## 2018-03-09 ENCOUNTER — Encounter: Payer: Self-pay | Admitting: Internal Medicine

## 2018-03-09 ENCOUNTER — Ambulatory Visit (INDEPENDENT_AMBULATORY_CARE_PROVIDER_SITE_OTHER): Payer: Medicare Other | Admitting: Internal Medicine

## 2018-03-09 VITALS — BP 159/70 | HR 88 | Temp 98.7°F | Ht 64.0 in | Wt 223.6 lb

## 2018-03-09 DIAGNOSIS — Z79899 Other long term (current) drug therapy: Secondary | ICD-10-CM

## 2018-03-09 DIAGNOSIS — G8929 Other chronic pain: Secondary | ICD-10-CM | POA: Diagnosis not present

## 2018-03-09 DIAGNOSIS — R2681 Unsteadiness on feet: Secondary | ICD-10-CM

## 2018-03-09 DIAGNOSIS — M25551 Pain in right hip: Secondary | ICD-10-CM | POA: Insufficient documentation

## 2018-03-09 DIAGNOSIS — N189 Chronic kidney disease, unspecified: Secondary | ICD-10-CM

## 2018-03-09 DIAGNOSIS — M545 Low back pain: Secondary | ICD-10-CM | POA: Diagnosis not present

## 2018-03-09 DIAGNOSIS — M1712 Unilateral primary osteoarthritis, left knee: Secondary | ICD-10-CM

## 2018-03-09 DIAGNOSIS — I129 Hypertensive chronic kidney disease with stage 1 through stage 4 chronic kidney disease, or unspecified chronic kidney disease: Secondary | ICD-10-CM | POA: Diagnosis not present

## 2018-03-09 DIAGNOSIS — E1122 Type 2 diabetes mellitus with diabetic chronic kidney disease: Secondary | ICD-10-CM

## 2018-03-09 DIAGNOSIS — Z87891 Personal history of nicotine dependence: Secondary | ICD-10-CM

## 2018-03-09 DIAGNOSIS — Z7984 Long term (current) use of oral hypoglycemic drugs: Secondary | ICD-10-CM | POA: Diagnosis not present

## 2018-03-09 DIAGNOSIS — M1611 Unilateral primary osteoarthritis, right hip: Secondary | ICD-10-CM | POA: Diagnosis not present

## 2018-03-09 DIAGNOSIS — E118 Type 2 diabetes mellitus with unspecified complications: Secondary | ICD-10-CM

## 2018-03-09 DIAGNOSIS — I10 Essential (primary) hypertension: Secondary | ICD-10-CM

## 2018-03-09 MED ORDER — ACETAMINOPHEN ER 650 MG PO TBCR: 650.0000 mg | EXTENDED_RELEASE_TABLET | Freq: Three times a day (TID) | ORAL | 2 refills | Status: DC | PRN

## 2018-03-09 NOTE — Progress Notes (Signed)
   CC: Lower back and right hip pain.  HPI:  [name] is a 67 y.o. with past medical history as listed below came to the clinic with complaint of worsening lower back and right hip pain.  Patient has an history of chronic low back pain and left knee pain, which has been treated with steroid injections with no relief twice. Patient was not sure whether her back pain is radiating to hip or she is having a separate hip pain.  She described hip pain on her lateral side of hip and wraps around to her deep groin area.  She denies any radiation.  She denies any tingling or numbness or focal weakness.  She is having difficulty with standing up from sitting position and not using a cane as she is having unstable gait due to right hip and left knee pain.  She had an history of arthroscopy done in left knee many years ago. Pain increased with movements. Improved with staying still in one position. She did get stiffness after sitting for longer time. She is not using any pain medicine and trying some supplement which includes trumaric to ease of her joint pains. She does not want to use much medications.  Chart review she has refused statin, ACE inhibitor and adding another medicine for her uncontrolled diabetes in the past.  She is trying to control her disease with diet and supplement and uses just hydrochlorothiazide and glipizide 5 mg daily.  Does not want to add any other medicine.  The assessment and plan for her chronic conditions.   Past Medical History:  Diagnosis Date  . Diabetes mellitus   . Hypertension   . Sarcoidosis   . Stroke [facility])    Review of Systems: Negative except mentioned in HPI.  Physical Exam:  Vitals:   03/09/18 1329  BP: (!) 159/70  Pulse: 88  Temp: 98.7 F (37.1 C)  TempSrc: Oral  SpO2: 93%  Weight: 223 lb 9.6 oz (101.4 kg)  Height: 5\' 4"  (1.626 m)   General: Vital signs reviewed.  Patient is well-developed and well-nourished, in no acute distress  and cooperative with exam.  Head: Normocephalic and atraumatic. Cardiovascular: RRR, S1 normal, S2 normal, no murmurs, gallops, or rubs. Pulmonary/Chest: Clear to auscultation bilaterally, no wheezes, rales, or rhonchi. Abdominal: Soft, non-tender, non-distended, BS +, Musculoskeletal: Mild tenderness along lower lumbosacral area, mild tenderness around lateral trochanteric area and mild deep tenderness at right groin, restricted range of motion due to pain at right hip and left knee. Extremities: No lower extremity edema bilaterally,  pulses symmetric and intact bilaterally. No cyanosis or clubbing. Skin: Warm, dry and intact. No rashes or erythema. Psychiatric: Normal mood and affect. speech and behavior is normal. Cognition and memory are normal.  Assessment & Plan:   See Encounters Tab for problem based charting.  Patient discussed with Dr. Beryle Beams.

## 2018-03-09 NOTE — Patient Instructions (Addendum)
Thank you for visiting clinic today. As we discussed we will do x-ray of your right hip, it looks like you are having arthritis in your lower back, left knee and now right hip. Try Tylenol arthritis which is 650 mg, 1 tablet every 8 hourly to see if that will help with your pain. As we also discussed you need more aggressive management of your high blood pressure and blood sugar, we are not adding anything at this time because you do not want to make any changes.  Aggressive management is important because of your worsening kidney function. You told me that another physician referred you to see a kidney doctor, please go for your appointment. Please follow-up in the clinic if your symptoms fail to resolved or get worse. Make a follow-up appointment with your primary care.

## 2018-03-10 ENCOUNTER — Telehealth: Payer: Self-pay

## 2018-03-10 DIAGNOSIS — M1990 Unspecified osteoarthritis, unspecified site: Secondary | ICD-10-CM | POA: Insufficient documentation

## 2018-03-10 DIAGNOSIS — M25551 Pain in right hip: Secondary | ICD-10-CM | POA: Insufficient documentation

## 2018-03-10 NOTE — Progress Notes (Signed)
Medicine attending: Medical history, presenting problems, physical findings, and medications, reviewed with resident physician Dr Sumayya Amin on the day of the patient visit and I concur with her evaluation and management plan. 

## 2018-03-10 NOTE — Assessment & Plan Note (Signed)
She was also complaining of worsening left knee pain.  I offered a left knee steroid injection which she declined.  Taking Tylenol 650 mg can help with left knee pain too.

## 2018-03-10 NOTE — Assessment & Plan Note (Signed)
Her complaints, exams are more consistent with right hip osteoarthritis. No imaging done yet. She does not want to take a pain pill on a regular basis.  Hip x-ray shows mild hip joint narrowing with some osteophytes more consistent with osteoarthritis. Advised the patient to use Tylenol arthritis 650 mg every 8 hourly if needed for pain. She can also try over-the-counter BenGay or capsicum cream to see if that will help.  We can offer physical therapy if she agrees.

## 2018-03-10 NOTE — Assessment & Plan Note (Signed)
BP Readings from Last 3 Encounters:  03/09/18 (!) 159/70  10/24/17 (!) 158/77  07/25/17 (!) 175/81   Her blood pressure was elevated today. Patient is just taking HCTZ, does not want to make any changes, stating that this blood pressure is good for her.  Had a long discussion regarding well-controlled hypertension and diabetes to prevent renal damage. Patient is having worsening CKD. Apparently she saw a outside physician who referred her to see a nephrologist, she has not made any appointments yet. That physician prescribed her a new medicine which she does not remember and does not take.  Also advised the patient that she should keep just one primary care, if she wants to go to this new physician she should start going to him regularly.  Does not want to repeat any labs today. At this point she will discontinue HCTZ. Is to be on a second agent preferably ACE inhibitor or ARB and needs a repeat BMP to stage her CKD.

## 2018-03-10 NOTE — Telephone Encounter (Signed)
Requesting x-ray test results. Please call back.

## 2018-03-13 NOTE — Telephone Encounter (Signed)
Pt is returning calling back about her Test Results.

## 2018-03-14 ENCOUNTER — Telehealth: Payer: Self-pay | Admitting: Internal Medicine

## 2018-03-14 NOTE — Telephone Encounter (Signed)
Pt is calling about results; pls call pt [phone]/ [phone]

## 2018-03-14 NOTE — Telephone Encounter (Signed)
Call the patient and discussed the results.

## 2018-03-15 NOTE — Telephone Encounter (Signed)
See Dr Reesa Chew telephone encounter on 10/1.

## 2018-05-14 NOTE — Progress Notes (Signed)
   CC: Follow-up of her hypertension, diabetes, and right hip pain  HPI: [name] is a 67 y.o.  with a PMH listed below presenting for follow-up of her hypertension and diabetes.  Please see A&P for status of the patient's chronic medical conditions  Past Medical History:  Diagnosis Date  . Diabetes mellitus   . Hypertension   . Sarcoidosis   . Stroke [facility]    Review of Systems: Refer to history of present illness and assessment and plans for pertinent review of systems, all others reviewed and negative.  Physical Exam:  Vitals:   05/15/18 1448  BP: 134/89  Pulse: 78  Temp: 98.6 F (37 C)  TempSrc: Oral  SpO2: 99%  Height: 5\' 4"  (1.626 m)   Physical Exam  Constitutional: She is well-developed, well-nourished, and in no distress.  Cardiovascular: Normal rate, regular rhythm and normal heart sounds. Exam reveals no gallop and no friction rub.  No murmur heard. Pulmonary/Chest: Effort normal and breath sounds normal. No respiratory distress.  Abdominal: Soft. Bowel sounds are normal. She exhibits no distension.  Musculoskeletal:  Right hip: Pain with internal and external rotation, tenderness over right lateral trochanteric area, pain with standing and transferring to exam bed    Social History   Socioeconomic History  . Marital status: Married    Spouse name: Not on file  . Number of children: Not on file  . Years of education: Not on file  . Highest education level: Not on file  Occupational History  . Not on file  Social Needs  . Financial resource strain: Not on file  . Food insecurity:    Worry: Not on file    Inability: Not on file  . Transportation needs:    Medical: Not on file    Non-medical: Not on file  Tobacco Use  . Smoking status: Former Smoker    Last attempt to quit: 08/09/1976    Years since quitting: 41.7  . Smokeless tobacco: Never Used  Substance and Sexual Activity  . Alcohol use: No    Alcohol/week: 0.0 standard drinks  .  Drug use: No  . Sexual activity: Not on file  Lifestyle  . Physical activity:    Days per week: Not on file    Minutes per session: Not on file  . Stress: Not on file  Relationships  . Social connections:    Talks on phone: Not on file    Gets together: Not on file    Attends religious service: Not on file    Active member of club or organization: Not on file    Attends meetings of clubs or organizations: Not on file    Relationship status: Not on file  . Intimate partner violence:    Fear of current or ex partner: Not on file    Emotionally abused: Not on file    Physically abused: Not on file    Forced sexual activity: Not on file  Other Topics Concern  . Not on file  Social History Narrative  . Not on file   Family History  Problem Relation Age of Onset  . Hypertension Son     Assessment & Plan:   See Encounters Tab for problem based charting.  Patient seen with Dr. Dareen Piano

## 2018-05-15 ENCOUNTER — Encounter: Payer: Self-pay | Admitting: Internal Medicine

## 2018-05-15 ENCOUNTER — Other Ambulatory Visit: Payer: Self-pay

## 2018-05-15 ENCOUNTER — Ambulatory Visit (INDEPENDENT_AMBULATORY_CARE_PROVIDER_SITE_OTHER): Payer: Medicare Other | Admitting: Internal Medicine

## 2018-05-15 VITALS — BP 134/89 | HR 78 | Temp 98.6°F | Ht 64.0 in

## 2018-05-15 DIAGNOSIS — N189 Chronic kidney disease, unspecified: Secondary | ICD-10-CM

## 2018-05-15 DIAGNOSIS — Z7984 Long term (current) use of oral hypoglycemic drugs: Secondary | ICD-10-CM | POA: Diagnosis not present

## 2018-05-15 DIAGNOSIS — M25551 Pain in right hip: Secondary | ICD-10-CM | POA: Diagnosis not present

## 2018-05-15 DIAGNOSIS — I10 Essential (primary) hypertension: Secondary | ICD-10-CM

## 2018-05-15 DIAGNOSIS — E1122 Type 2 diabetes mellitus with diabetic chronic kidney disease: Secondary | ICD-10-CM | POA: Diagnosis not present

## 2018-05-15 DIAGNOSIS — Z79899 Other long term (current) drug therapy: Secondary | ICD-10-CM

## 2018-05-15 DIAGNOSIS — Z87891 Personal history of nicotine dependence: Secondary | ICD-10-CM

## 2018-05-15 DIAGNOSIS — E1169 Type 2 diabetes mellitus with other specified complication: Secondary | ICD-10-CM | POA: Diagnosis not present

## 2018-05-15 DIAGNOSIS — I129 Hypertensive chronic kidney disease with stage 1 through stage 4 chronic kidney disease, or unspecified chronic kidney disease: Secondary | ICD-10-CM | POA: Diagnosis not present

## 2018-05-15 DIAGNOSIS — Z1231 Encounter for screening mammogram for malignant neoplasm of breast: Secondary | ICD-10-CM

## 2018-05-15 LAB — POCT GLYCOSYLATED HEMOGLOBIN (HGB A1C): Hemoglobin A1C: 9 % — AB (ref 4.0–5.6)

## 2018-05-15 LAB — GLUCOSE, CAPILLARY: GLUCOSE-CAPILLARY: 150 mg/dL — AB (ref 70–99)

## 2018-05-15 MED ORDER — TRAMADOL HCL 50 MG PO TABS: 50.0000 mg | ORAL_TABLET | Freq: Three times a day (TID) | ORAL | 0 refills | Status: AC | PRN

## 2018-05-15 MED ORDER — HYDROCHLOROTHIAZIDE 25 MG PO TABS: 25.0000 mg | ORAL_TABLET | Freq: Every day | ORAL | 1 refills | Status: DC

## 2018-05-15 NOTE — Patient Instructions (Addendum)
Thank you for allowing [place] to provide your care today. Today we discussed your hip pain, diabetes, and blood pressure.     Please continue to take the hydrochlorothiazide as prescribed.  You can start taking tramadol 50 mg every 8 hours as needed.  I have placed a referral for the orthopedic doctor.   Please take the glipizide twice a day. Check your blood sugars up to 2 times a day and bring in your glucometer to your next visit.   Please follow-up in 3 months.    Should you have any questions or concerns please call the internal medicine clinic at [phone].

## 2018-05-16 LAB — LIPID PANEL
CHOLESTEROL TOTAL: 227 mg/dL — AB (ref 100–199)
Chol/HDL Ratio: 3.8 ratio (ref 0.0–4.4)
HDL: 60 mg/dL (ref >39)
LDL CALC: 149 mg/dL — AB (ref 0–99)
Triglycerides: 89 mg/dL (ref 0–149)
VLDL Cholesterol Cal: 18 mg/dL (ref 5–40)

## 2018-05-16 NOTE — Assessment & Plan Note (Signed)
Patient is currently on hydrochlorothiazide 25 mg daily. Blood pressure today was 134/89.  She saw an outside physician who prescribed her amlodipine however she has not started this because she has been using of hydrochlorothiazide. She has been having worsening CKD and is going to follow-up with nephrology.  She denies any issues taking the medications, no side effects.  Since her blood pressure is at goal and she has not had any issues with the medications we will continue the hydrochlorothiazide 25 mg daily.  Plan:  -Continue HCTZ 25 mg daily -Follow up with nephrology

## 2018-05-17 ENCOUNTER — Telehealth: Payer: Self-pay

## 2018-05-17 NOTE — Assessment & Plan Note (Signed)
She reports that she is having significant right hip pain that is sharp in nature and will radiate over to her right groin and down her leg.  She reports that the pain is worse with walking on it and improves with sitting.  She has been taking Tylenol which seems to help a little bit but she does not like taking this, she has not tried anything else. On exam she has tenderness with internal rotation of her right hip and pain with standing. She is requesting a referral to orthopedics to discuss other options instead of pain medications.   Plan: -Tramadol 5 mg q8 hr PRN x 5 days -Orthopedic referral

## 2018-05-17 NOTE — Progress Notes (Signed)
Internal Medicine Clinic Attending  I saw and evaluated the patient.  I personally confirmed the key portions of the history and exam documented by Dr. Krienke and I reviewed pertinent patient test results.  The assessment, diagnosis, and plan were formulated together and I agree with the documentation in the resident's note.    

## 2018-05-17 NOTE — Telephone Encounter (Signed)
Needs to speak with a nurse about tramadol. Please call pt back.

## 2018-05-17 NOTE — Assessment & Plan Note (Signed)
Patient is supposed to be on glipizide 5 mg BID, however she reports that she is only been taking in once a day.  Her last A1c was 9.1 on 10/24/17 and today it is 9.0.  She reports that she does not check her sugars for a few months however she has had times where she feels "different" which she thinks is related to lower blood sugars. We discussed the importance of checking her blood sugars daily or at least when she has those episode of feeling "different" to see if her symptoms are related to hypoglycemia.   Plan: -Continue glipizide 5 mg twice daily for now, can consider switching to and SGLT-2 inhibitor -Hemoglobin A1c -She will contact ophthalmology for eye exam -Lipid panel

## 2018-05-17 NOTE — Assessment & Plan Note (Signed)
She had a mammogram scheduled however for was noted to have the loop recorder in place and was advised to have that removed before getting a mammogram.  Advised patient to contact Heart care to have this removed.

## 2018-05-17 NOTE — Addendum Note (Signed)
Addended by: Aldine Contes on: 05/17/2018 11:08 AM   Modules accepted: Level of Service

## 2018-05-17 NOTE — Telephone Encounter (Signed)
Stated she felt light-headededand dizzy this morning after taking Tramadol last night but feeling better now. Informed to call us back if it happens again; stated she will.

## 2018-05-17 NOTE — Telephone Encounter (Signed)
If she calls back and is still having dizziness or light headedness after taking the tamadol please ask her to stop taking it and to continue taking tylenol. She should have a follow up with orthopedics to assess her hip pain. Thank you.

## 2018-05-23 ENCOUNTER — Ambulatory Visit (INDEPENDENT_AMBULATORY_CARE_PROVIDER_SITE_OTHER): Payer: Medicare Other | Admitting: Orthopaedic Surgery

## 2018-05-25 ENCOUNTER — Encounter: Payer: Self-pay | Admitting: Internal Medicine

## 2018-05-25 ENCOUNTER — Other Ambulatory Visit: Payer: Self-pay

## 2018-05-25 ENCOUNTER — Ambulatory Visit (INDEPENDENT_AMBULATORY_CARE_PROVIDER_SITE_OTHER): Payer: Medicare Other | Admitting: Internal Medicine

## 2018-05-25 ENCOUNTER — Ambulatory Visit (HOSPITAL_COMMUNITY)
Admission: RE | Admit: 2018-05-25 | Discharge: 2018-05-25 | Disposition: A | Discharge status: Home / Self Care | Payer: Medicare Other | Source: Ambulatory Visit | Attending: Internal Medicine | Admitting: Internal Medicine

## 2018-05-25 VITALS — BP 173/84 | HR 91 | Temp 99.8°F | Ht 64.0 in

## 2018-05-25 DIAGNOSIS — M25571 Pain in right ankle and joints of right foot: Secondary | ICD-10-CM

## 2018-05-25 DIAGNOSIS — M1990 Unspecified osteoarthritis, unspecified site: Secondary | ICD-10-CM

## 2018-05-25 DIAGNOSIS — Z87891 Personal history of nicotine dependence: Secondary | ICD-10-CM

## 2018-05-25 HISTORY — DX: Pain in right ankle and joints of right foot: M25.571

## 2018-05-25 MED ORDER — NAPROXEN 500 MG PO TABS: 500.0000 mg | ORAL_TABLET | Freq: Two times a day (BID) | ORAL | 0 refills | Status: DC

## 2018-05-25 NOTE — Progress Notes (Signed)
   CC: R Ankle Pain  HPI:  [name] is a 67 y.o. F with PMHx listed below presenting for  R Ankle Pain. Please see the A&P for the status of the patient's chronic medical problems.   Past Medical History:  Diagnosis Date  . Diabetes mellitus   . Hypertension   . Sarcoidosis   . Stroke [facility])    Review of Systems:  Performed and all others negative.  Physical Exam:  Vitals:   05/25/18 1358  BP: (!) 173/84  Pulse: 91  Temp: 99.8 F (37.7 C)  TempSrc: Oral  SpO2: 100%  Height: 5\' 4"  (1.626 m)   Physical Exam Constitutional:      General: She is not in acute distress.    Appearance: Normal appearance.  Cardiovascular:     Rate and Rhythm: Normal rate and regular rhythm.     Heart sounds: Normal heart sounds.  Pulmonary:     Effort: Pulmonary effort is normal. No respiratory distress.     Breath sounds: Normal breath sounds.  Abdominal:     General: Bowel sounds are normal. There is no distension.     Palpations: Abdomen is soft.     Tenderness: There is no abdominal tenderness.  Musculoskeletal:     Comments: Mild edema and moderate tenderness to the lateral dorsal aspect of her right foot. Only minimal warmth. ROM intact with some tenderness with dorsiflexion.  Skin:    General: Skin is warm and dry.      Assessment & Plan:   See Encounters Tab for problem based charting.  Patient discussed with Dr. Dareen Piano

## 2018-05-25 NOTE — Patient Instructions (Addendum)
Thank you for allowing [place] to care for you  For your ankle pain - We have ordered an xray for further evaluation - We have ordered medication to take for pain for the next 5 days for pain - Pick up a wrap for your ankle for support - Have you foot re-evaluated at your orthopedics visit  - Return to clinic if your foot becomes hot, you experienced increased swelling, or if you have fevers

## 2018-05-25 NOTE — Assessment & Plan Note (Addendum)
Patient reports 3 day of R ankle pain. Patient denies trauma or other injury to the ankle. The pain is described as a waxing and waning pain. Worse with walking or manipulation (up to 10/10) and improved with rest and elevation (to 1-2/10). She has not tried anything to relieve the pain at home. She denies fevers. She does endorse a sensation of hand swelling though this is not appreciable on exam.  On exam patient has mild edema and moderate tenderness to the lateral dorsal aspect of her right foot. Only minimal warmth. ROM intact with some tenderness with dorsiflexion. Gout less likely due to lack of significant warmth or erythema. She does have a history of OA but the acuity of this pain makes this less likely. Though she denies any injury to the site that she is aware of, her presentation is most consistent with ankle sprain/strain. - Will obtain R ankle xray for further evaluation - Naproxen 500mg  BID x5day (short course due to CKD) - Recommend wrap or brace - Return precautions given

## 2018-05-26 ENCOUNTER — Telehealth: Payer: Self-pay | Admitting: Internal Medicine

## 2018-05-26 NOTE — Telephone Encounter (Signed)
Patient contacted with the results of her recent xray. She was informed that there were no fractures nor significant boney abnormalities. She was encouraged to use a brace for her ankle and ambulate as able.

## 2018-05-26 NOTE — Progress Notes (Signed)
Internal Medicine Clinic Attending  I saw and evaluated the patient.  I personally confirmed the key portions of the history and exam documented by Dr. Melvin and I reviewed pertinent patient test results.  The assessment, diagnosis, and plan were formulated together and I agree with the documentation in the resident's note.  

## 2018-05-26 NOTE — Telephone Encounter (Signed)
Pt is calling regarding results; pt contact [phone]

## 2018-05-29 DIAGNOSIS — E785 Hyperlipidemia, unspecified: Secondary | ICD-10-CM | POA: Diagnosis not present

## 2018-05-29 DIAGNOSIS — I639 Cerebral infarction, unspecified: Secondary | ICD-10-CM | POA: Diagnosis not present

## 2018-05-29 DIAGNOSIS — I129 Hypertensive chronic kidney disease with stage 1 through stage 4 chronic kidney disease, or unspecified chronic kidney disease: Secondary | ICD-10-CM | POA: Diagnosis not present

## 2018-05-29 DIAGNOSIS — N183 Chronic kidney disease, stage 3 (moderate): Secondary | ICD-10-CM | POA: Diagnosis not present

## 2018-05-29 DIAGNOSIS — E1122 Type 2 diabetes mellitus with diabetic chronic kidney disease: Secondary | ICD-10-CM | POA: Diagnosis not present

## 2018-05-30 ENCOUNTER — Ambulatory Visit (INDEPENDENT_AMBULATORY_CARE_PROVIDER_SITE_OTHER): Payer: Medicare Other | Admitting: Orthopaedic Surgery

## 2018-05-30 ENCOUNTER — Ambulatory Visit (INDEPENDENT_AMBULATORY_CARE_PROVIDER_SITE_OTHER): Payer: Medicare Other

## 2018-05-30 ENCOUNTER — Encounter (INDEPENDENT_AMBULATORY_CARE_PROVIDER_SITE_OTHER): Payer: Self-pay | Admitting: Orthopaedic Surgery

## 2018-05-30 DIAGNOSIS — M1611 Unilateral primary osteoarthritis, right hip: Secondary | ICD-10-CM | POA: Diagnosis not present

## 2018-05-30 DIAGNOSIS — M79671 Pain in right foot: Secondary | ICD-10-CM

## 2018-05-30 DIAGNOSIS — M7671 Peroneal tendinitis, right leg: Secondary | ICD-10-CM | POA: Diagnosis not present

## 2018-05-30 HISTORY — DX: Peroneal tendinitis, right leg: M76.71

## 2018-05-30 MED ORDER — DICLOFENAC SODIUM 1 % TD GEL: 2.0000 g | Freq: Four times a day (QID) | TRANSDERMAL | 1 refills | Status: DC

## 2018-05-30 NOTE — Progress Notes (Signed)
Office Visit Note   Patient: Wendy [name]           Date of Birth: March 04, 1951           MRN: [id number] Visit Date: 05/30/2018              Requested by: Asencion Noble, MD [address], [place] [address] PCP: Asencion Noble, MD   Assessment & Plan: Visit Diagnoses:  1. Unilateral primary osteoarthritis, right hip   2. Peroneal tendinitis, right leg   3. Pain in right foot     Plan: Impression is #1 right hip primary localized osteoarthritis.  #2 right ankle peroneal tendinitis and tibiotalar osteoarthritis.  In regards to the right hip, of offered the patient a cortisone injection today under ultrasound.  She is politely declined.  She will call us when her pain worsens and we will refer her to Dr. Junius Roads or Ernestina Patches for an intra-articular cortisone injection.  In regards to the right ankle, we will place her in a cam walker weightbearing as tolerated.  I will also send her to physical therapy and call in a prescription of Voltaren gel.  She will follow-up with [place] in 4 weeks time for recheck of the right ankle/foot.  Follow-Up Instructions: Return in about 4 weeks (around 06/27/2018).   Orders:  Orders Placed This Encounter  Procedures  . XR Foot Complete Right   Meds ordered this encounter  Medications  . diclofenac sodium (VOLTAREN) 1 % GEL    Sig: Apply 2 g topically 4 (four) times daily.    Dispense:  1 Tube    Refill:  1      Procedures: No procedures performed   Clinical Data: No additional findings.   Subjective: Chief Complaint  Patient presents with  . Right Hip - Pain    HPI patient is a pleasant 66 year old female presents our clinic today with right hip and right foot pain.  In regards to her right hip, 5 months history of increased pain primarily to the groin with occasional radiation to the lateral hip.  No specific injury, but she does note a trip to [place] around [place] where she was riding on a bus for several weeks.  This  was back in July.  The pain seems to be worse with walking as well as sleeping on the right side.  She denies any radicular symptoms to the right lower extremity.  She has been taking naproxen with mild relief of symptoms.  Her pain does seem to be much better today.  X-rays reviewed by me in canopy of the right hip show marked degenerative changes.  No previous cortisone injection of the right hip joint.  Other issue she brings up today is her right foot.  Pain to the anterolateral aspect over the past 4 days.  No known injury or change in activity, but she does note that she has been limping recently due to the right hip pain.  She is also noticed some swelling as well.  Pain is worse with bearing weight.  She has been elevating and taking naproxen with mild relief of symptoms.  Previous x-rays of the right ankle from 05/25/2018 show mild tibiotalar arthritis but nothing more.  Review of Systems as detailed in HPI.  All others reviewed and are negative.   Objective: Vital Signs: There were no vitals taken for this visit.  Physical Exam well-developed and well-nourished female no acute distress.  Alert and oriented  x3.  Ortho Exam examination of her right ankle reveals mild tenderness to the anterior aspect.  She does have marked tenderness along the peroneal tendon into the fourth and fifth metatarsals.  No increased pain with resisted eversion and inversion.  She does have increased pain with dorsiflexion.  Moderate swelling throughout.  Calf is soft nontender.  Specialty Comments:  No specialty comments available.  Imaging: Xr Foot Complete Right  Result Date: 05/30/2018 X-rays demonstrate a small avulsion off the cuboid as well as tibiotalar spurring    PMFS History: Patient Active Problem List   Diagnosis Date Noted  . Unilateral primary osteoarthritis, right hip 05/30/2018  . Peroneal tendinitis, right leg 05/30/2018  . Acute right ankle pain 05/25/2018  . Right hip pain  03/10/2018  . History of CVA (cerebrovascular accident) 09/26/2015  . Chronic kidney disease 10/14/2014  . Left arm pain 10/14/2014  . Low back pain radiating to right lower extremity 02/11/2014  . Background diabetic retinopathy(362.01) 07/25/2012  . Screening mammogram, encounter for 06/29/2012  . Sarcoidosis of lung ([place]) 09/30/2011  . Hyperlipidemia 08/10/2011  . Diabetes ([place]) 06/26/2008  . Essential hypertension, benign 06/26/2008  . Osteoarthritis of left knee 06/26/2008   Past Medical History:  Diagnosis Date  . Diabetes mellitus   . Hypertension   . Sarcoidosis   . Stroke [facility])     Family History  Problem Relation Age of Onset  . Hypertension Son     Past Surgical History:  Procedure Laterality Date  . EP IMPLANTABLE DEVICE N/A 09/29/2015   Procedure: Loop Recorder Insertion;  Surgeon: Evans Lance, MD;  Location: [place] [facility];  Service: Cardiovascular;  Laterality: N/A;  . INTRAOCULAR LENS INSERTION    . TEE WITHOUT CARDIOVERSION N/A 09/29/2015   Procedure: TRANSESOPHAGEAL ECHOCARDIOGRAM (TEE);  Surgeon: Larey Dresser, MD;  Location: [place];  Service: Cardiovascular;  Laterality: N/A;   Social History   Occupational History  . Not on file  Tobacco Use  . Smoking status: Former Smoker    Last attempt to quit: 08/09/1976    Years since quitting: 41.8  . Smokeless tobacco: Never Used  Substance and Sexual Activity  . Alcohol use: No    Alcohol/week: 0.0 standard drinks  . Drug use: No  . Sexual activity: Not on file

## 2018-06-01 ENCOUNTER — Other Ambulatory Visit: Payer: Self-pay

## 2018-06-01 ENCOUNTER — Ambulatory Visit: Payer: Medicare Other | Attending: Orthopaedic Surgery | Admitting: Physical Therapy

## 2018-06-01 ENCOUNTER — Encounter: Payer: Self-pay | Admitting: Physical Therapy

## 2018-06-01 DIAGNOSIS — R262 Difficulty in walking, not elsewhere classified: Secondary | ICD-10-CM | POA: Insufficient documentation

## 2018-06-01 DIAGNOSIS — M6281 Muscle weakness (generalized): Secondary | ICD-10-CM | POA: Diagnosis not present

## 2018-06-01 DIAGNOSIS — R600 Localized edema: Secondary | ICD-10-CM | POA: Diagnosis not present

## 2018-06-01 DIAGNOSIS — M25571 Pain in right ankle and joints of right foot: Secondary | ICD-10-CM

## 2018-06-01 NOTE — Therapy (Signed)
[place], [place], [address] Phone: [phone]   Fax:  [phone]  Physical Therapy Evaluation  Patient Details  Name: Wendy [name] MRN: [id number] Date of Birth: 1950-06-21 Referring Provider (PT): Frankey Shown, MD   Encounter Date: 06/01/2018  PT End of Session - 06/01/18 2243    Visit Number  1    Number of Visits  8    Date for PT Re-Evaluation  07/13/18    Authorization Type  [employer]    PT Start Time  6073    PT Stop Time  1803    PT Time Calculation (min)  48 min    Activity Tolerance  Patient tolerated treatment well    Behavior During Therapy  [facility] for tasks assessed/performed       Past Medical History:  Diagnosis Date  . Diabetes mellitus   . Hypertension   . Sarcoidosis   . Stroke [facility])     Past Surgical History:  Procedure Laterality Date  . EP IMPLANTABLE DEVICE N/A 09/29/2015   Procedure: Loop Recorder Insertion;  Surgeon: Evans Lance, MD;  Location: [place] [facility];  Service: Cardiovascular;  Laterality: N/A;  . INTRAOCULAR LENS INSERTION    . TEE WITHOUT CARDIOVERSION N/A 09/29/2015   Procedure: TRANSESOPHAGEAL ECHOCARDIOGRAM (TEE);  Surgeon: Larey Dresser, MD;  Location: [place];  Service: Cardiovascular;  Laterality: N/A;    There were no vitals filed for this visit.   Subjective Assessment - 06/01/18 2223    Subjective  Pt. reports 5 day history of right lateral ankle pain symptoms. No mechanism of injury noted. Pt. had also been having right hip pain and saw MD for this as well (diagnosed as OA and declined injection). Pt. reports hip improving but continues with right peroneal tendon region pain. Pt. also reports has recently had some left knee pain issues but has not seen MD for this. Pt. put in CAM boot-reports difficulty walking in this and requests w/c transport to tx. area.    Pertinent History  diabetes, stroke, sarcoidosis, left knee and right hip  pain issues    Limitations  Standing;Walking    How long can you sit comfortably?  no limitations    How long can you stand comfortably?  pt. reports difficulty stating specific timeframe but minimal limitation with static standing    How long can you walk comfortably?  unable    Diagnostic tests  X-rays    Patient Stated Goals  Walk without pain    Currently in Pain?  Yes    Pain Score  5     Pain Location  Ankle    Pain Orientation  Right    Pain Descriptors / Indicators  Dull    Pain Type  Acute pain    Pain Onset  In the past 7 days    Pain Frequency  Intermittent    Aggravating Factors   walking    Pain Relieving Factors  rest    Effect of Pain on Daily Activities  limits tolerance walking for IADLs and community mobility         [facility] PT Assessment - 06/01/18 0001      Assessment   Medical Diagnosis  Right peroneal tendinitis    Referring Provider (PT)  Frankey Shown, MD    Onset Date/Surgical Date  05/27/18    Hand Dominance  Right    Prior Therapy  --   none for current episode  Precautions   Precautions  None    Precaution Comments  Pt. has implanted cardiac loop recorder device-she reports this is "off"/no longer functioning and needs to be removed    Required Braces or Orthoses  --   CAM boot for ambulation     Balance Screen   Has the patient fallen in the past 6 months  No      [place] residence    Living Arrangements  Spouse/significant other    Home Access  --   1 step   Home Equipment  --   has RW-reports intermittent use of this     Prior Function   Level of Independence  Independent with basic ADLs      Cognition   Overall Cognitive Status  Within Functional Limits for tasks assessed      Observation/Other Assessments   Focus on Therapeutic Outcomes (FOTO)   58% limited      Observation/Other Assessments-Edema    Edema  --   Rt ankle 32 cm. Lt ankle 31 cm at mid-malleolus     Sensation   Additional  Comments  Pt. reports intermittent bilat. foot parasthesias      ROM / Strength   AROM / PROM / Strength  AROM;Strength      AROM   AROM Assessment Site  Ankle    Right/Left Ankle  Right;Left    Right Ankle Dorsiflexion  0    Right Ankle Plantar Flexion  35    Right Ankle Inversion  20    Right Ankle Eversion  5    Left Ankle Dorsiflexion  3    Left Ankle Plantar Flexion  50    Left Ankle Inversion  10    Left Ankle Eversion  10      Strength   Strength Assessment Site  Ankle    Right/Left Ankle  Right;Left    Right Ankle Dorsiflexion  4+/5    Right Ankle Plantar Flexion  4/5    Right Ankle Inversion  4/5    Right Ankle Eversion  4-/5    Left Ankle Dorsiflexion  5/5    Left Ankle Plantar Flexion  5/5    Left Ankle Inversion  5/5    Left Ankle Eversion  5/5      Flexibility   Soft Tissue Assessment /Muscle Length  --   tight right gastrocnemius and peroneals     Transfers   Transfers  --   SBA pivot transfer w/c<>mat in CAM boot     Ambulation/Gait   Gait Comments  Pt. had significant difficulty ambulating in CAM boot and requested w/c transport to eval room, SBA as noted in CAM boot, required family member assist to ambulate in boot                Objective measurements completed on examination: See above findings.      [place] Adult PT Treatment/Exercise - 06/01/18 0001      Exercises   Exercises  Ankle      Modalities   Modalities  Ultrasound      Ultrasound   Ultrasound Location  Right distal peroneal tendon    Ultrasound Parameters  3 MHZ 50% 1.0 W/cm2    Ultrasound Goals  Edema;Pain      Ankle Exercises: Stretches   Gastroc Stretch  --   instructed HEP longsitting stretch with strap     Ankle Exercises: Seated   Other Seated Ankle Exercises  instructed T-band EV ROM             PT Education - 06/01/18 2242    Education Details  HEP, POC, etiology symptoms    Person(s) Educated  Patient    Methods   Explanation;Demonstration;Tactile cues;Verbal cues;Handout    Comprehension  Verbal cues required;Tactile cues required;Returned demonstration;Verbalized understanding       PT Short Term Goals - 06/01/18 2255      PT SHORT TERM GOAL #1   Title  Independent with HEP    Time  3    Period  Weeks    Status  New      PT SHORT TERM GOAL #2   Title  Household and short distance community ambulation 150 feet or less mod I in CAM boot    Baseline  needs w/c waiting room to tx. area    Time  3    Period  Weeks    Status  New        PT Long Term Goals - 06/01/18 2254      PT LONG TERM GOAL #1   Title  Improve FOTO score to 47% or less impairment    Baseline  58% limited    Time  6    Period  Weeks    Status  New    Target Date  07/13/18      PT LONG TERM GOAL #2   Title  Increase right ankle DF AROM 3-5 deg for improved toe clearance with gait    Baseline  0 deg    Time  6    Period  Weeks    Status  New    Target Date  07/13/18      PT LONG TERM GOAL #3   Title  Increase right ankle strength grossly 1/2 MMT or greater to improve ankle stability with gait    Baseline  4-/5 to 4+/5    Time  6    Period  Weeks    Status  New    Target Date  07/13/18      PT LONG TERM GOAL #4   Title  Independent household ambulation without AD, community ambulation mod I with RW or LRAD    Baseline  requires w/c transport to tx. area    Time  6    Period  Weeks    Status  New    Target Date  07/13/18             Plan - 06/01/18 2244    Clinical Impression Statement  Pt. presents with right lateral ankle/heel pain consistent with referring dx. peroneal tendinitis with decreased ankle ROM, associated muscle tightness and weakness. Pt. would benefit from PT to address current functional limitations for mobility. Pt. interested in PT for left knee but has not seen MD for this/no referral. Plan proceed with PT for ankle as this is current greatest functional limitations and if pt. has  knee assessed by MD/receives PT referral for knee will add to plan of care at future session as needed.    History and Personal Factors relevant to plan of care:  stroke, sarcoidosis, diabetes, left ankle and right hip pain    Clinical Presentation  Stable    Clinical Decision Making  Low    Rehab Potential  Good    Clinical Impairments Affecting Rehab Potential  difficulty walking in CAM boot, medical comorbidities and multiple pain regions    PT Frequency  --  1-2x/week   PT Duration  6 weeks    PT Treatment/Interventions  ADLs/Self Care Home Management;Cryotherapy;Ultrasound;Iontophoresis 4mg /ml Dexamethasone;Moist Heat;Therapeutic activities;Therapeutic exercise;Balance training;Gait training;Neuromuscular re-education;Manual techniques;Dry needling;Taping;Patient/family education    PT Next Visit Plan  see precautions re: implanted device (no longer "on"/functioning), gait training in CAM boot with RW as needed, [place] distal peroneal tendon, peroneal/gastroc stretches, ankle ROM, gentle T-band exercises eccentric emphasis EV and PF, STM peroneals    PT Home Exercise Plan  gastroc/peroneal stretch with strap, ankle EV with T-band    Consulted and Agree with Plan of Care  Patient       Patient will benefit from skilled therapeutic intervention in order to improve the following deficits and impairments:  Obesity, Impaired flexibility, Increased edema, Difficulty walking, Decreased balance, Decreased activity tolerance, Decreased endurance, Decreased range of motion, Decreased strength, Hypomobility, Abnormal gait, Pain  Visit Diagnosis: Pain in right ankle and joints of right foot  Difficulty in walking, not elsewhere classified  Localized edema  Muscle weakness (generalized)     Problem List Patient Active Problem List   Diagnosis Date Noted  . Unilateral primary osteoarthritis, right hip 05/30/2018  . Peroneal tendinitis, right leg 05/30/2018  . Acute right ankle pain 05/25/2018   . Right hip pain 03/10/2018  . History of CVA (cerebrovascular accident) 09/26/2015  . Chronic kidney disease 10/14/2014  . Left arm pain 10/14/2014  . Low back pain radiating to right lower extremity 02/11/2014  . Background diabetic retinopathy(362.01) 07/25/2012  . Screening mammogram, encounter for 06/29/2012  . Sarcoidosis of lung ([place]) 09/30/2011  . Hyperlipidemia 08/10/2011  . Diabetes ([place]) 06/26/2008  . Essential hypertension, benign 06/26/2008  . Osteoarthritis of left knee 06/26/2008    Beaulah Dinning, PT, DPT 06/01/18 11:02 PM  [facility] [address] [place], [place], [address] Phone: [phone]   Fax:  [phone]  Name: Wendy [name] MRN: [id number] Date of Birth: [birth year]

## 2018-06-09 ENCOUNTER — Encounter

## 2018-06-12 ENCOUNTER — Telehealth (INDEPENDENT_AMBULATORY_CARE_PROVIDER_SITE_OTHER): Payer: Self-pay | Admitting: Orthopaedic Surgery

## 2018-06-12 NOTE — Telephone Encounter (Signed)
Diclofenac sodium(Voltaren)  Patient called would like to know if we could prescribe a cheaper Gel due to price

## 2018-06-13 NOTE — Telephone Encounter (Signed)
Talked with patient and advised her of message below per Dr. Erlinda Hong.

## 2018-06-13 NOTE — Telephone Encounter (Signed)
Could you please advise on message below concerning another Rx due to cost?  Thank You

## 2018-06-13 NOTE — Telephone Encounter (Signed)
Capsaicin cream or biofreeze.  Both are OTC

## 2018-06-16 ENCOUNTER — Encounter

## 2018-06-22 ENCOUNTER — Encounter: Payer: Self-pay | Admitting: Physical Therapy

## 2018-06-22 ENCOUNTER — Ambulatory Visit: Payer: Medicare Other | Attending: Orthopaedic Surgery | Admitting: Physical Therapy

## 2018-06-22 DIAGNOSIS — R262 Difficulty in walking, not elsewhere classified: Secondary | ICD-10-CM

## 2018-06-22 DIAGNOSIS — R600 Localized edema: Secondary | ICD-10-CM

## 2018-06-22 DIAGNOSIS — M6281 Muscle weakness (generalized): Secondary | ICD-10-CM | POA: Diagnosis not present

## 2018-06-22 DIAGNOSIS — M25571 Pain in right ankle and joints of right foot: Secondary | ICD-10-CM | POA: Diagnosis not present

## 2018-06-23 NOTE — Therapy (Signed)
[place], [place], [address] Phone: [phone]   Fax:  [phone]  Physical Therapy Treatment  Patient Details  Name: Wendy [name] MRN: [id number] Date of Birth: 1950/06/30 Referring Provider (PT): Frankey Shown, MD   Encounter Date: 06/22/2018  PT End of Session - 06/23/18 0923    Visit Number  2    Number of Visits  8    Date for PT Re-Evaluation  07/13/18    Authorization Type  [employer]    PT Start Time  6433    PT Stop Time  1457    PT Time Calculation (min)  42 min    Activity Tolerance  Patient tolerated treatment well    Behavior During Therapy  [facility] for tasks assessed/performed       Past Medical History:  Diagnosis Date  . Diabetes mellitus   . Hypertension   . Sarcoidosis   . Stroke [facility])     Past Surgical History:  Procedure Laterality Date  . EP IMPLANTABLE DEVICE N/A 09/29/2015   Procedure: Loop Recorder Insertion;  Surgeon: Evans Lance, MD;  Location: [place] [facility];  Service: Cardiovascular;  Laterality: N/A;  . INTRAOCULAR LENS INSERTION    . TEE WITHOUT CARDIOVERSION N/A 09/29/2015   Procedure: TRANSESOPHAGEAL ECHOCARDIOGRAM (TEE);  Surgeon: Larey Dresser, MD;  Location: [place];  Service: Cardiovascular;  Laterality: N/A;    There were no vitals filed for this visit.  Subjective Assessment - 06/22/18 1415    Subjective  Patien reports things are about the same. She is still using a whee chair for mobility. She reports it treports it is mostly because of right hip pain and left knee pain. She rpeorts her foot hasnt hurt as much     Pertinent History  diabetes, stroke, sarcoidosis, left knee and right hip pain issues    How long can you stand comfortably?  pt. reports difficulty stating specific timeframe but minimal limitation with static standing    How long can you walk comfortably?  unable    Diagnostic tests  X-rays    Patient Stated Goals  Walk without  pain    Currently in Pain?  Yes    Pain Score  9     Pain Location  Hip    Pain Orientation  Right    Pain Descriptors / Indicators  Aching    Pain Type  Acute pain    Pain Onset  More than a month ago    Pain Frequency  Constant    Aggravating Factors   walking     Pain Relieving Factors  rest    Effect of Pain on Daily Activities  limited tolerance to walking                        [facility] Adult PT Treatment/Exercise - 06/23/18 0001      Manual Therapy   Manual Therapy  Soft tissue mobilization;Passive ROM;Joint mobilization    Joint Mobilization  gentle anterior drawer glde; Calcaneal mobilization     Soft tissue mobilization  trigger point release to plantarfacia; TFM to peroneal trendon; trigger point release to gastroc     Passive ROM  into DF      Ankle Exercises: Supine   T-Band  4 way ankle t-band     Other Supine Ankle Exercises  quad set for the knee.  PT Education - 06/22/18 1554    Education Details  reviewed HEP; symptom mangement; improtance of stretching     Person(s) Educated  Patient    Methods  Explanation;Demonstration;Verbal cues;Tactile cues    Comprehension  Verbalized understanding;Returned demonstration;Verbal cues required       PT Short Term Goals - 06/01/18 2255      PT SHORT TERM GOAL #1   Title  Independent with HEP    Time  3    Period  Weeks    Status  New      PT SHORT TERM GOAL #2   Title  Household and short distance community ambulation 150 feet or less mod I in CAM boot    Baseline  needs w/c waiting room to tx. area    Time  3    Period  Weeks    Status  New        PT Long Term Goals - 06/01/18 2254      PT LONG TERM GOAL #1   Title  Improve FOTO score to 47% or less impairment    Baseline  58% limited    Time  6    Period  Weeks    Status  New    Target Date  07/13/18      PT LONG TERM GOAL #2   Title  Increase right ankle DF AROM 3-5 deg for improved toe clearance with gait     Baseline  0 deg    Time  6    Period  Weeks    Status  New    Target Date  07/13/18      PT LONG TERM GOAL #3   Title  Increase right ankle strength grossly 1/2 MMT or greater to improve ankle stability with gait    Baseline  4-/5 to 4+/5    Time  6    Period  Weeks    Status  New    Target Date  07/13/18      PT LONG TERM GOAL #4   Title  Independent household ambulation without AD, community ambulation mod I with RW or LRAD    Baseline  requires w/c transport to tx. area    Time  6    Period  Weeks    Status  New    Target Date  07/13/18            Plan - 06/22/18 1603    Clinical Impression Statement  Patient continues to have tightness with ankle dorsiflexion. She was given stretching for home. She is still using the wheelchair for primary mobility whcih is concerning. She states it is because of left knee and right hip pain. She will be going to the MD on Tuesday. She was advised to get a prescription to work on her hip if that was thought to be beneficial.     Clinical Presentation  Stable    Rehab Potential  Good    Clinical Impairments Affecting Rehab Potential  difficulty walking in CAM boot, medical comorbidities and multiple pain regions    PT Duration  6 weeks    PT Treatment/Interventions  ADLs/Self Care Home Management;Cryotherapy;Ultrasound;Iontophoresis 4mg /ml Dexamethasone;Moist Heat;Therapeutic activities;Therapeutic exercise;Balance training;Gait training;Neuromuscular re-education;Manual techniques;Dry needling;Taping;Patient/family education    PT Next Visit Plan  see precautions re: implanted device (no longer "on"/functioning), gait training in CAM boot with RW as needed, [place] distal peroneal tendon, peroneal/gastroc stretches, ankle ROM, gentle T-band exercises eccentric emphasis EV and PF, STM peroneals  PT Home Exercise Plan  gastroc/peroneal stretch with strap, ankle EV with T-band    Consulted and Agree with Plan of Care  Patient       Patient  will benefit from skilled therapeutic intervention in order to improve the following deficits and impairments:  Obesity, Impaired flexibility, Increased edema, Difficulty walking, Decreased balance, Decreased activity tolerance, Decreased endurance, Decreased range of motion, Decreased strength, Hypomobility, Abnormal gait, Pain  Visit Diagnosis: Pain in right ankle and joints of right foot  Difficulty in walking, not elsewhere classified  Localized edema  Muscle weakness (generalized)     Problem List Patient Active Problem List   Diagnosis Date Noted  . Unilateral primary osteoarthritis, right hip 05/30/2018  . Peroneal tendinitis, right leg 05/30/2018  . Acute right ankle pain 05/25/2018  . Right hip pain 03/10/2018  . History of CVA (cerebrovascular accident) 09/26/2015  . Chronic kidney disease 10/14/2014  . Left arm pain 10/14/2014  . Low back pain radiating to right lower extremity 02/11/2014  . Background diabetic retinopathy(362.01) 07/25/2012  . Screening mammogram, encounter for 06/29/2012  . Sarcoidosis of lung ([place]) 09/30/2011  . Hyperlipidemia 08/10/2011  . Diabetes ([place]) 06/26/2008  . Essential hypertension, benign 06/26/2008  . Osteoarthritis of left knee 06/26/2008    Carney Living PT DPT  06/23/2018, 9:29 AM  [facility] [address] [place], [place], [address] Phone: [phone]   Fax:  [phone]  Name: Wendy [name] MRN: [id number] Date of Birth: 15-Jul-1950

## 2018-06-26 ENCOUNTER — Ambulatory Visit: Payer: Medicare Other | Admitting: Physical Therapy

## 2018-06-27 ENCOUNTER — Ambulatory Visit (INDEPENDENT_AMBULATORY_CARE_PROVIDER_SITE_OTHER): Payer: Medicare Other | Admitting: Orthopaedic Surgery

## 2018-06-27 ENCOUNTER — Encounter (INDEPENDENT_AMBULATORY_CARE_PROVIDER_SITE_OTHER): Payer: Self-pay | Admitting: Orthopaedic Surgery

## 2018-06-27 DIAGNOSIS — M1611 Unilateral primary osteoarthritis, right hip: Secondary | ICD-10-CM

## 2018-06-27 DIAGNOSIS — M7671 Peroneal tendinitis, right leg: Secondary | ICD-10-CM

## 2018-06-27 MED ORDER — METHYLPREDNISOLONE ACETATE 40 MG/ML IJ SUSP: 40.0000 mg | Freq: Once | INTRAMUSCULAR | Status: DC

## 2018-06-27 NOTE — Addendum Note (Signed)
Addended by: Hortencia Pilar on: 06/27/2018 04:00 PM   Modules accepted: Orders

## 2018-06-27 NOTE — Progress Notes (Signed)
Office Visit Note   Patient: Wendy [name]           Date of Birth: Sep 25, 1950           MRN: [id number] Visit Date: 06/27/2018              Requested by: Asencion Noble, MD [address], [place] [address] PCP: Asencion Noble, MD   Assessment & Plan: Visit Diagnoses:  1. Unilateral primary osteoarthritis, right hip   2. Peroneal tendinitis, right leg     Plan: At this point we will discontinue her cam boot and transition her to an ASO brace.  Patient is not able to take NSAIDs.  We discussed the importance of weight loss and its implications on her hip arthritis.  We did arrange for a right hip injection with Dr. Junius Roads today also.  Patient instructed to follow-up if she does not improve from this.  Follow-Up Instructions: Return if symptoms worsen or fail to improve.   Orders:  No orders of the defined types were placed in this encounter.  Meds ordered this encounter  Medications  . methylPREDNISolone acetate (DEPO-MEDROL) injection 40 mg      Procedures: No procedures performed   Clinical Data: No additional findings.   Subjective: Chief Complaint  Patient presents with  . Right Hip - Pain, Follow-up    Wendy [name] returns today for her right hip pain and right ankle pain.  She states that the cam boot has made her ankle feel significantly better.  She has been taking Tylenol for her pain.  She endorses right hip and groin pain.  She states that the cam boot is making her right hip feel worse because of the weight and the leg length discrepancy causes.   Review of Systems  Constitutional: Negative.   HENT: Negative.   Eyes: Negative.   Respiratory: Negative.   Cardiovascular: Negative.   Endocrine: Negative.   Musculoskeletal: Negative.   Neurological: Negative.   Hematological: Negative.   Psychiatric/Behavioral: Negative.   All other systems reviewed and are negative.    Objective: Vital Signs: There were no vitals taken for this  visit.  Physical Exam Vitals signs and nursing note reviewed.  Constitutional:      Appearance: She is well-developed.  Pulmonary:     Effort: Pulmonary effort is normal.  Skin:    General: Skin is warm.     Capillary Refill: Capillary refill takes less than 2 seconds.  Neurological:     Mental Status: She is alert and oriented to person, place, and time.  Psychiatric:        Behavior: Behavior normal.        Thought Content: Thought content normal.        Judgment: Judgment normal.     Ortho Exam Right hip and ankle exams are stable. Right hip exam localizes pain to the right hip region and groin.  Positive logroll. Specialty Comments:  No specialty comments available.  Imaging: No results found.   PMFS History: Patient Active Problem List   Diagnosis Date Noted  . Unilateral primary osteoarthritis, right hip 05/30/2018  . Peroneal tendinitis, right leg 05/30/2018  . Acute right ankle pain 05/25/2018  . Right hip pain 03/10/2018  . History of CVA (cerebrovascular accident) 09/26/2015  . Chronic kidney disease 10/14/2014  . Left arm pain 10/14/2014  . Low back pain radiating to right lower extremity 02/11/2014  . Background diabetic retinopathy(362.01) 07/25/2012  . Screening mammogram, encounter  for 06/29/2012  . Sarcoidosis of lung ([place]) 09/30/2011  . Hyperlipidemia 08/10/2011  . Diabetes ([place]) 06/26/2008  . Essential hypertension, benign 06/26/2008  . Osteoarthritis of left knee 06/26/2008   Past Medical History:  Diagnosis Date  . Diabetes mellitus   . Hypertension   . Sarcoidosis   . Stroke [facility])     Family History  Problem Relation Age of Onset  . Hypertension Son     Past Surgical History:  Procedure Laterality Date  . EP IMPLANTABLE DEVICE N/A 09/29/2015   Procedure: Loop Recorder Insertion;  Surgeon: Evans Lance, MD;  Location: [place] [facility];  Service: Cardiovascular;  Laterality: N/A;  . INTRAOCULAR LENS INSERTION    . TEE WITHOUT  CARDIOVERSION N/A 09/29/2015   Procedure: TRANSESOPHAGEAL ECHOCARDIOGRAM (TEE);  Surgeon: Larey Dresser, MD;  Location: [place];  Service: Cardiovascular;  Laterality: N/A;   Social History   Occupational History  . Not on file  Tobacco Use  . Smoking status: Former Smoker    Last attempt to quit: 08/09/1976    Years since quitting: 41.9  . Smokeless tobacco: Never Used  Substance and Sexual Activity  . Alcohol use: No    Alcohol/week: 0.0 standard drinks  . Drug use: No  . Sexual activity: Not on file

## 2018-06-27 NOTE — Progress Notes (Addendum)
S:  Here for ultrasound-guided right hip injection for DJD.  O:  Pain and decreased ROM with passive internal rotation.  Procedure: Ultrasound-guided right hip injection: After sterile prep with Betadine, injected 8 cc 1% lidocaine without epinephrine and 40 mg of methylprednisolone using a 22-gauge spinal needle, passing the needle through the iliofemoral ligament into the femoral head/neck junction.  Injectate was seen filling the joint capsule.  She had good pain relief during the immediate anesthetic phase.  Follow-up as directed.

## 2018-07-04 ENCOUNTER — Ambulatory Visit: Payer: Medicare Other | Admitting: Physical Therapy

## 2018-07-04 ENCOUNTER — Encounter: Payer: Self-pay | Admitting: Physical Therapy

## 2018-07-04 DIAGNOSIS — M25571 Pain in right ankle and joints of right foot: Secondary | ICD-10-CM

## 2018-07-04 DIAGNOSIS — R600 Localized edema: Secondary | ICD-10-CM

## 2018-07-04 DIAGNOSIS — R262 Difficulty in walking, not elsewhere classified: Secondary | ICD-10-CM | POA: Diagnosis not present

## 2018-07-04 DIAGNOSIS — M6281 Muscle weakness (generalized): Secondary | ICD-10-CM | POA: Diagnosis not present

## 2018-07-04 NOTE — Therapy (Signed)
[place], [place], [address] Phone: [phone]   Fax:  [phone]  Physical Therapy Treatment  Patient Details  Name: Wendy [name] MRN: [id number] Date of Birth: 1951/06/11 Referring Provider (PT): Frankey Shown, MD   Encounter Date: 07/04/2018  PT End of Session - 07/04/18 1628    Visit Number  3    Number of Visits  8    Date for PT Re-Evaluation  07/13/18    Authorization Type  [employer]    PT Start Time  8315    PT Stop Time  1504    PT Time Calculation (min)  49 min    Activity Tolerance  Patient tolerated treatment well    Behavior During Therapy  [facility] for tasks assessed/performed       Past Medical History:  Diagnosis Date  . Diabetes mellitus   . Hypertension   . Sarcoidosis   . Stroke [facility])     Past Surgical History:  Procedure Laterality Date  . EP IMPLANTABLE DEVICE N/A 09/29/2015   Procedure: Loop Recorder Insertion;  Surgeon: Evans Lance, MD;  Location: [place] [facility];  Service: Cardiovascular;  Laterality: N/A;  . INTRAOCULAR LENS INSERTION    . TEE WITHOUT CARDIOVERSION N/A 09/29/2015   Procedure: TRANSESOPHAGEAL ECHOCARDIOGRAM (TEE);  Surgeon: Larey Dresser, MD;  Location: [place];  Service: Cardiovascular;  Laterality: N/A;    There were no vitals filed for this visit.  Subjective Assessment - 07/04/18 1619    Subjective  Patient reports she has a perscription for her hip but she is not sure she wants therapy to work on her hip because it hurts too bad She was advised she should let therapy work o her hip because it hurts. she is back to using a cane and not using a wheelchair for mobility.     Pertinent History  diabetes, stroke, sarcoidosis, left knee and right hip pain issues    Limitations  Standing;Walking    How long can you sit comfortably?  no limitations    How long can you stand comfortably?  pt. reports difficulty stating specific timeframe but  minimal limitation with static standing    How long can you walk comfortably?  unable    Diagnostic tests  X-rays    Patient Stated Goals  Walk without pain    Currently in Pain?  Yes    Pain Score  8     Pain Location  Hip    Pain Orientation  Right    Pain Descriptors / Indicators  Aching    Pain Type  Acute pain    Pain Onset  More than a month ago    Pain Frequency  Constant    Aggravating Factors   walking     Pain Relieving Factors  rest    Effect of Pain on Daily Activities  limited tolerance to walking     Multiple Pain Sites  Yes    Pain Score  3    Pain Location  Ankle    Pain Orientation  Right;Upper    Pain Descriptors / Indicators  Aching    Pain Onset  More than a month ago    Pain Frequency  Constant    Aggravating Factors   standing and walking     Pain Relieving Factors  rest  [facility] PT Treatment/Exercise - 07/04/18 0001      Manual Therapy   Manual Therapy  Soft tissue mobilization;Passive ROM;Joint mobilization    Joint Mobilization  gentle anterior drawer glde; Calcaneal mobilization     Soft tissue mobilization  trigger point release to plantarfacia; TFM to peroneal trendon; trigger point release to gastroc     Passive ROM  into DF      Ankle Exercises: Stretches   Gastroc Stretch  3 reps;10 seconds   with strap      Ankle Exercises: Seated   Other Seated Ankle Exercises  with pressure heel raise 3x10; toe raise 3x10; windsheiled wiper 3x10;              PT Education - 07/04/18 1628    Education Details  improtance of walking     Person(s) Educated  Patient    Methods  Explanation;Demonstration;Tactile cues;Verbal cues    Comprehension  Verbalized understanding;Returned demonstration;Tactile cues required;Verbal cues required;Need further instruction       PT Short Term Goals - 07/04/18 1648      PT SHORT TERM GOAL #1   Title  Independent with HEP    Time  3    Period  Weeks    Status   On-going      PT SHORT TERM GOAL #2   Title  Household and short distance community ambulation 150 feet or less mod I in CAM boot    Baseline  ambualting with shoe and cane     Time  3    Period  Weeks    Status  On-going        PT Long Term Goals - 06/01/18 2254      PT LONG TERM GOAL #1   Title  Improve FOTO score to 47% or less impairment    Baseline  58% limited    Time  6    Period  Weeks    Status  New    [place] Date  07/13/18      PT LONG TERM GOAL #2   Title  Increase right ankle DF AROM 3-5 deg for improved toe clearance with gait    Baseline  0 deg    Time  6    Period  Weeks    Status  New    [place] Date  07/13/18      PT LONG TERM GOAL #3   Title  Increase right ankle strength grossly 1/2 MMT or greater to improve ankle stability with gait    Baseline  4-/5 to 4+/5    Time  6    Period  Weeks    Status  New    Target Date  07/13/18      PT LONG TERM GOAL #4   Title  Independent household ambulation without AD, community ambulation mod I with RW or LRAD    Baseline  requires w/c transport to tx. area    Time  6    Period  Weeks    Status  New    Target Date  07/13/18            Plan - 07/04/18 1639    Clinical Impression Statement  Patient encouraged to continue walking as much as possible. She is very anxious about her hip having something wrong with it but x-rays show only mild degenetration. She was strongly advised to bring her script next visit so therapy can work on her hip. She tolerated stretching and exercises  well. Her ankle motion has improved but she is still having pain just past neutral. She had tirgger points throughout her calfd on the left side. She was given seated exercises with weight bearing. Significant time spent educationg the patient on the improtance of walking and how walking can help build strength.     Clinical Presentation  Stable    Clinical Decision Making  Low    Rehab Potential  Good    Clinical Impairments  Affecting Rehab Potential  difficulty walking in CAM boot, medical comorbidities and multiple pain regions    PT Frequency  2x / week    PT Duration  6 weeks    PT Treatment/Interventions  ADLs/Self Care Home Management;Cryotherapy;Ultrasound;Iontophoresis 4mg /ml Dexamethasone;Moist Heat;Therapeutic activities;Therapeutic exercise;Balance training;Gait training;Neuromuscular re-education;Manual techniques;Dry needling;Taping;Patient/family education    PT Next Visit Plan  see precautions re: implanted device (no longer "on"/functioning), gait training in CAM boot with RW as needed, [place] distal peroneal tendon, peroneal/gastroc stretches, ankle ROM, gentle T-band exercises eccentric emphasis EV and PF, STM peroneals    PT Home Exercise Plan  gastroc/peroneal stretch with strap, ankle EV with T-band    Consulted and Agree with Plan of Care  Patient       Patient will benefit from skilled therapeutic intervention in order to improve the following deficits and impairments:  Obesity, Impaired flexibility, Increased edema, Difficulty walking, Decreased balance, Decreased activity tolerance, Decreased endurance, Decreased range of motion, Decreased strength, Hypomobility, Abnormal gait, Pain  Visit Diagnosis: Pain in right ankle and joints of right foot  Difficulty in walking, not elsewhere classified  Localized edema  Muscle weakness (generalized)     Problem List Patient Active Problem List   Diagnosis Date Noted  . Unilateral primary osteoarthritis, right hip 05/30/2018  . Peroneal tendinitis, right leg 05/30/2018  . Acute right ankle pain 05/25/2018  . Right hip pain 03/10/2018  . History of CVA (cerebrovascular accident) 09/26/2015  . Chronic kidney disease 10/14/2014  . Left arm pain 10/14/2014  . Low back pain radiating to right lower extremity 02/11/2014  . Background diabetic retinopathy(362.01) 07/25/2012  . Screening mammogram, encounter for 06/29/2012  . Sarcoidosis of lung  ([place]) 09/30/2011  . Hyperlipidemia 08/10/2011  . Diabetes ([place]) 06/26/2008  . Essential hypertension, benign 06/26/2008  . Osteoarthritis of left knee 06/26/2008    Carney Living PT DPT  07/04/2018, 4:51 PM  [facility] [address] [place], [place], [address] Phone: [phone]   Fax:  [phone]  Name: Wendy [name] MRN: [id number] Date of Birth: Jul 31, 1950

## 2018-07-11 ENCOUNTER — Encounter: Payer: Self-pay | Admitting: Physical Therapy

## 2018-07-11 ENCOUNTER — Ambulatory Visit: Payer: Medicare Other | Admitting: Physical Therapy

## 2018-07-11 DIAGNOSIS — R262 Difficulty in walking, not elsewhere classified: Secondary | ICD-10-CM | POA: Diagnosis not present

## 2018-07-11 DIAGNOSIS — M6281 Muscle weakness (generalized): Secondary | ICD-10-CM

## 2018-07-11 DIAGNOSIS — R600 Localized edema: Secondary | ICD-10-CM

## 2018-07-11 DIAGNOSIS — M25571 Pain in right ankle and joints of right foot: Secondary | ICD-10-CM

## 2018-07-12 ENCOUNTER — Encounter: Payer: Self-pay | Admitting: Physical Therapy

## 2018-07-12 NOTE — Therapy (Addendum)
[place], [place], [address] Phone: [phone]   Fax:  [phone]  Physical Therapy Treatment/Recert/Discharge  Patient Details  Name: Wendy [name] MRN: [id number] Date of Birth: December 23, 1950 Referring Provider (PT): Frankey Shown, MD   Encounter Date: 07/11/2018  PT End of Session - 07/11/18 1418    Visit Number  4    Number of Visits  12    Date for PT Re-Evaluation  08/09/18    Authorization Type  [employer] PROGRESS NOTE AT VISIT 10    PT Start Time  1415    PT Stop Time  1456    PT Time Calculation (min)  41 min    Activity Tolerance  Patient tolerated treatment well    Behavior During Therapy  [facility] for tasks assessed/performed       Past Medical History:  Diagnosis Date  . Diabetes mellitus   . Hypertension   . Sarcoidosis   . Stroke [facility])     Past Surgical History:  Procedure Laterality Date  . EP IMPLANTABLE DEVICE N/A 09/29/2015   Procedure: Loop Recorder Insertion;  Surgeon: Evans Lance, MD;  Location: [place] [facility];  Service: Cardiovascular;  Laterality: N/A;  . INTRAOCULAR LENS INSERTION    . TEE WITHOUT CARDIOVERSION N/A 09/29/2015   Procedure: TRANSESOPHAGEAL ECHOCARDIOGRAM (TEE);  Surgeon: Larey Dresser, MD;  Location: [place];  Service: Cardiovascular;  Laterality: N/A;    There were no vitals filed for this visit.  Subjective Assessment - 07/11/18 1419    Subjective  Patient reports her ankle continues to improve. Her right hip is still hurting her a lot. She has a perscription for her hip but hasn't decided if she wants thenapy to work on it or not. She did a lot of walking on her right ankle over the weekend.     Pertinent History  diabetes, stroke, sarcoidosis, left knee and right hip pain issues    Limitations  Standing;Walking    How long can you sit comfortably?  no limitations    How long can you stand comfortably?  pt. reports difficulty stating specific  timeframe but minimal limitation with static standing    How long can you walk comfortably?  unable    Diagnostic tests  X-rays    Patient Stated Goals  Walk without pain    Currently in Pain?  Yes    Pain Score  2     Pain Location  Ankle    Pain Orientation  Right    Pain Descriptors / Indicators  Aching    Pain Type  Acute pain    Pain Onset  More than a month ago    Aggravating Factors   walking     Pain Relieving Factors  rest    Effect of Pain on Daily Activities  limited tolerance to walking     Pain Score  6    Pain Location  Hip    Pain Orientation  Right    Pain Descriptors / Indicators  Aching    Pain Type  Chronic pain    Pain Onset  More than a month ago    Pain Frequency  Constant    Aggravating Factors   standing and walking     Pain Relieving Factors  rest         [facility] PT Assessment - 07/12/18 0001      AROM   Right Ankle Dorsiflexion  5  Right Ankle Plantar Flexion  30    Right Ankle Inversion  28    Right Ankle Eversion  13      Strength   Right Ankle Dorsiflexion  5/5    Right Ankle Inversion  5/5    Right Ankle Eversion  5/5    Left Ankle Dorsiflexion  5/5    Left Ankle Inversion  5/5    Left Ankle Eversion  5/5                           PT Education - 07/12/18 1312    Education Details  reviewed quad sets    Person(s) Educated  Patient    Methods  Explanation;Demonstration;Tactile cues;Verbal cues    Comprehension  Verbalized understanding;Returned demonstration;Verbal cues required       PT Short Term Goals - 07/04/18 1648      PT SHORT TERM GOAL #1   Title  Independent with HEP    Time  3    Period  Weeks    Status  On-going      PT SHORT TERM GOAL #2   Title  Household and short distance community ambulation 150 feet or less mod I in CAM boot    Baseline  ambualting with shoe and cane     Time  3    Period  Weeks    Status  On-going        PT Long Term Goals - 06/01/18 2254      PT LONG TERM GOAL  #1   Title  Improve FOTO score to 47% or less impairment    Baseline  58% limited    Time  6    Period  Weeks    Status  New    Target Date  07/13/18      PT LONG TERM GOAL #2   Title  Increase right ankle DF AROM 3-5 deg for improved toe clearance with gait    Baseline  0 deg    Time  6    Period  Weeks    Status  New    Target Date  07/13/18      PT LONG TERM GOAL #3   Title  Increase right ankle strength grossly 1/2 MMT or greater to improve ankle stability with gait    Baseline  4-/5 to 4+/5    Time  6    Period  Weeks    Status  New    Target Date  07/13/18      PT LONG TERM GOAL #4   Title  Independent household ambulation without AD, community ambulation mod I with RW or LRAD    Baseline  requires w/c transport to tx. area    Time  6    Period  Weeks    Status  New    Target Date  07/13/18            Plan - 07/12/18 1314    Clinical Impression Statement  Patient ankle has progressed well. Her motion has improved. Her DF is to 5 degrees on the right side. She is having very little pain in her ankle. She is having significant pain in her hip still. Functionaly she is no longer using her wheelchair. The normal progression of ankle activity woulfd be to standing activity but she has too much pain in her hip. She has her script for her hip but is hesitant to bring it because  she feels we will put her into too much pain. Sh ewas strongly advised that we will do things to make her hip feel better. She will come for 1-2 more visits for her ankle. If she widhes we may go loger for her hip but we need to evaluate it first.     Clinical Presentation  Evolving    Clinical Decision Making  Low    Rehab Potential  Good    PT Frequency  2x / week    PT Duration  4 weeks    PT Treatment/Interventions  ADLs/Self Care Home Management;Cryotherapy;Ultrasound;Iontophoresis 37m/ml Dexamethasone;Moist Heat;Therapeutic activities;Therapeutic exercise;Balance training;Gait  training;Neuromuscular re-education;Manual techniques;Dry needling;Taping;Patient/family education    PT Next Visit Plan  see precautions re: implanted device (no longer "on"/functioning), gait training if hip tolerates. X-ray shows minimal deneration in her hip but she seems to have significant bursitis. She     PT Home Exercise Plan  gastroc/peroneal stretch with strap, ankle EV with T-band       Patient will benefit from skilled therapeutic intervention in order to improve the following deficits and impairments:  Obesity, Impaired flexibility, Increased edema, Difficulty walking, Decreased balance, Decreased activity tolerance, Decreased endurance, Decreased range of motion, Decreased strength, Hypomobility, Abnormal gait, Pain  Visit Diagnosis: Pain in right ankle and joints of right foot - Plan: PT plan of care cert/re-cert  Difficulty in walking, not elsewhere classified - Plan: PT plan of care cert/re-cert  Localized edema - Plan: PT plan of care cert/re-cert  Muscle weakness (generalized) - Plan: PT plan of care cert/re-cert     Problem List Patient Active Problem List   Diagnosis Date Noted  . Unilateral primary osteoarthritis, right hip 05/30/2018  . Peroneal tendinitis, right leg 05/30/2018  . Acute right ankle pain 05/25/2018  . Right hip pain 03/10/2018  . History of CVA (cerebrovascular accident) 09/26/2015  . Chronic kidney disease 10/14/2014  . Left arm pain 10/14/2014  . Low back pain radiating to right lower extremity 02/11/2014  . Background diabetic retinopathy(362.01) 07/25/2012  . Screening mammogram, encounter for 06/29/2012  . Sarcoidosis of lung ([place] 09/30/2011  . Hyperlipidemia 08/10/2011  . Diabetes ([place] 06/26/2008  . Essential hypertension, benign 06/26/2008  . Osteoarthritis of left knee 06/26/2008       PHYSICAL THERAPY DISCHARGE SUMMARY  Visits from Start of Care: 5  Current functional level related to goals / functional  outcomes: Current status unknown. Patient did not return for further therapy after last session 07/11/18.   Remaining deficits: Unknown   Education / Equipment: NA Plan: Patient agrees to discharge.  Patient goals were not met. Patient is being discharged due to not returning since the last visit.  ?????           CBeaulah Dinning PT, DPT 08/21/18 5:20 PM     [facility] StreetGSardinia [place] [address]Phone: [phone]  Fax:  [phone] Name: Wendy ROSEMANMRN: [id number] of Birth: 425-Feb-1952

## 2018-07-18 ENCOUNTER — Telehealth (INDEPENDENT_AMBULATORY_CARE_PROVIDER_SITE_OTHER): Payer: Self-pay | Admitting: Orthopaedic Surgery

## 2018-07-18 NOTE — Telephone Encounter (Signed)
Patient called stating that the injection in her hip has not helped and wanted to know what her next step would be.  CB#[phone].  Thank you.

## 2018-07-18 NOTE — Telephone Encounter (Signed)
Let's have her come back in for office visit.

## 2018-07-18 NOTE — Telephone Encounter (Signed)
Please advise 

## 2018-07-19 NOTE — Telephone Encounter (Signed)
Patient scheduled 07/20/2018 at 10:00am

## 2018-07-19 NOTE — Telephone Encounter (Signed)
Please make appt for patient. Thank you.

## 2018-07-20 ENCOUNTER — Ambulatory Visit (INDEPENDENT_AMBULATORY_CARE_PROVIDER_SITE_OTHER): Payer: Medicare Other | Admitting: Orthopaedic Surgery

## 2018-07-25 ENCOUNTER — Ambulatory Visit (INDEPENDENT_AMBULATORY_CARE_PROVIDER_SITE_OTHER): Payer: Medicare Other

## 2018-07-25 ENCOUNTER — Encounter (INDEPENDENT_AMBULATORY_CARE_PROVIDER_SITE_OTHER): Payer: Self-pay | Admitting: Orthopaedic Surgery

## 2018-07-25 ENCOUNTER — Ambulatory Visit (INDEPENDENT_AMBULATORY_CARE_PROVIDER_SITE_OTHER): Payer: Medicare Other | Admitting: Orthopaedic Surgery

## 2018-07-25 ENCOUNTER — Telehealth (INDEPENDENT_AMBULATORY_CARE_PROVIDER_SITE_OTHER): Payer: Self-pay

## 2018-07-25 DIAGNOSIS — M545 Low back pain: Secondary | ICD-10-CM

## 2018-07-25 DIAGNOSIS — M1712 Unilateral primary osteoarthritis, left knee: Secondary | ICD-10-CM | POA: Diagnosis not present

## 2018-07-25 DIAGNOSIS — G8929 Other chronic pain: Secondary | ICD-10-CM

## 2018-07-25 DIAGNOSIS — M25551 Pain in right hip: Secondary | ICD-10-CM | POA: Diagnosis not present

## 2018-07-25 NOTE — Telephone Encounter (Signed)
Please submit for gel injection Left Knee-Dr Erlinda Hong.

## 2018-07-25 NOTE — Progress Notes (Signed)
Office Visit Note   Patient: Wendy [name]           Date of Birth: 05-12-1951           MRN: [id number] Visit Date: 07/25/2018              Requested by: Asencion Noble, MD [address] [place], [place] [address] PCP: Asencion Noble, MD   Assessment & Plan: Visit Diagnoses:  1. Pain of right hip joint   2. Chronic right-sided low back pain, unspecified whether sciatica present   3. Primary localized osteoarthritis of left knee     Plan: Impression is right hip osteoarthritis, right hip trochanteric bursitis, lumbago and left knee and stage of joint disease.  In regards to the right hip pain, I think majority of her pain is coming from the trochanteric bursa.  I have discussed injecting this with cortisone today as well as providing her with an iliotibial band stretching program.  Due to her elevated blood sugars following cortisone injection, we will hold off on this for now.  She will talk to her PCP about possibly dosing her medication accordingly should she follow-up with [place] for injection.  We will provide her with an iliotibial band stretching program today.  In regards to the left knee, I have discussed definitive treatment of total knee replacement.  I have discussed with her the need for her A1c to be below 8 before proceeding with surgery.  She would like to try course of viscosupplementation injection.  She will follow-up with [place] once approved.   Follow-Up Instructions: Return if symptoms worsen or fail to improve.   Orders:  Orders Placed This Encounter  Procedures  . XR Lumbar Spine 2-3 Views  . XR Knee Complete 4 Views Left   No orders of the defined types were placed in this encounter.     Procedures: No procedures performed   Clinical Data: No additional findings.   Subjective: Chief Complaint  Patient presents with  . Right Hip - Pain  . Spine - Pain    HPI patient is a pleasant 68 year old female who presents our clinic today with  recurrent right hip pain.  This is been ongoing for the past 4 to 5 months.  She has had pain to her right lower back as well as the groin, lateral hip and anterior aspect of her thigh.  History of marked osteoarthritis to the right hip.  She has been recently seen by Dr. Junius Roads where she is undergone an intra-articular hip injection.  She noticed mild improvement of symptoms even during the anesthetic phase.  Pain appears to be worse with ambulation as well as with standing for long periods of time.  She denies any pain down the back of her leg.  No numbness, tingling or burning.  The other issue she brings up today is her left knee.  History of end-stage renal joint disease.  She has had this injected twice with out relief of symptoms.  She feels that her pain has worsened as she has been favoring this knee due to her right lower extremity pain.  Of note, she is a type II diabetic on glipizide.  Her last hemoglobin A1c was over 9.  She notes that the cortisone injection significantly increased her blood sugar and blood pressure.  Review of Systems as detailed HPI.  All others reviewed and are negative.   Objective: Vital Signs: There were no vitals taken for this visit.  Physical Exam well-developed and well-nourished female in no acute distress.  Alert and oriented x3.  Ortho Exam examination of the right lower extremity reveals a positive straight leg raise.  Mildly positive logroll.  Increased pain with lumbar flexion.  No bony tenderness.  No focal weakness.  She is neurovascularly intact distally.  Specialty Comments:  No specialty comments available.  Imaging: Xr Knee Complete 4 Views Left  Result Date: 07/25/2018 Marked tricompartmental degenerative changes with significant periarticular spurring  Xr Lumbar Spine 2-3 Views  Result Date: 07/25/2018 Mild diffuse degenerative disc disease    PMFS History: Patient Active Problem List   Diagnosis Date Noted  . Unilateral primary  osteoarthritis, right hip 05/30/2018  . Peroneal tendinitis, right leg 05/30/2018  . Acute right ankle pain 05/25/2018  . Pain of right hip joint 03/10/2018  . History of CVA (cerebrovascular accident) 09/26/2015  . Chronic kidney disease 10/14/2014  . Left arm pain 10/14/2014  . Chronic right-sided low back pain 02/11/2014  . Background diabetic retinopathy(362.01) 07/25/2012  . Screening mammogram, encounter for 06/29/2012  . Sarcoidosis of lung ([place]) 09/30/2011  . Hyperlipidemia 08/10/2011  . Diabetes ([place]) 06/26/2008  . Essential hypertension, benign 06/26/2008  . Primary localized osteoarthritis of left knee 06/26/2008   Past Medical History:  Diagnosis Date  . Diabetes mellitus   . Hypertension   . Sarcoidosis   . Stroke [facility])     Family History  Problem Relation Age of Onset  . Hypertension Son     Past Surgical History:  Procedure Laterality Date  . EP IMPLANTABLE DEVICE N/A 09/29/2015   Procedure: Loop Recorder Insertion;  Surgeon: Evans Lance, MD;  Location: [place] [facility];  Service: Cardiovascular;  Laterality: N/A;  . INTRAOCULAR LENS INSERTION    . TEE WITHOUT CARDIOVERSION N/A 09/29/2015   Procedure: TRANSESOPHAGEAL ECHOCARDIOGRAM (TEE);  Surgeon: Larey Dresser, MD;  Location: [place];  Service: Cardiovascular;  Laterality: N/A;   Social History   Occupational History  . Not on file  Tobacco Use  . Smoking status: Former Smoker    Last attempt to quit: 08/09/1976    Years since quitting: 41.9  . Smokeless tobacco: Never Used  Substance and Sexual Activity  . Alcohol use: No    Alcohol/week: 0.0 standard drinks  . Drug use: No  . Sexual activity: Not on file

## 2018-07-28 NOTE — Telephone Encounter (Signed)
Noted  

## 2018-08-04 ENCOUNTER — Telehealth (INDEPENDENT_AMBULATORY_CARE_PROVIDER_SITE_OTHER): Payer: Self-pay

## 2018-08-04 NOTE — Telephone Encounter (Signed)
Submitted VOB for Monovisc, left knee. 

## 2018-08-11 ENCOUNTER — Telehealth (INDEPENDENT_AMBULATORY_CARE_PROVIDER_SITE_OTHER): Payer: Self-pay

## 2018-08-11 NOTE — Telephone Encounter (Signed)
Called and left VM for patient to CB and schedule appointment with Dr. Erlinda Hong for gel injection.  Approved for Monovisc, left knee. [place]  Patient is covered at 100% through 2nd insurance. No Co-pay No PA required

## 2018-08-15 ENCOUNTER — Telehealth: Payer: Self-pay

## 2018-08-15 NOTE — Telephone Encounter (Signed)
Pt would like a scooter. Please call back.

## 2018-08-15 NOTE — Telephone Encounter (Signed)
rtc to pt, informed her to speak w/ otho md tomorrow at appt about a motorized device and assisting her with the process, she is agreeable

## 2018-08-16 ENCOUNTER — Encounter (INDEPENDENT_AMBULATORY_CARE_PROVIDER_SITE_OTHER): Payer: Self-pay | Admitting: Orthopaedic Surgery

## 2018-08-16 ENCOUNTER — Ambulatory Visit (INDEPENDENT_AMBULATORY_CARE_PROVIDER_SITE_OTHER): Payer: Medicare Other | Admitting: Orthopaedic Surgery

## 2018-08-16 DIAGNOSIS — M1712 Unilateral primary osteoarthritis, left knee: Secondary | ICD-10-CM

## 2018-08-16 MED ORDER — LIDOCAINE HCL 1 % IJ SOLN
2.0000 mL | INTRAMUSCULAR | Status: AC | PRN
  Administered 2018-08-16: 2 mL

## 2018-08-16 MED ORDER — BUPIVACAINE HCL 0.25 % IJ SOLN
2.0000 mL | INTRAMUSCULAR | Status: AC | PRN
  Administered 2018-08-16: 2 mL via INTRA_ARTICULAR

## 2018-08-16 MED ORDER — HYALURONAN 88 MG/4ML IX SOSY
88.0000 mg | PREFILLED_SYRINGE | INTRA_ARTICULAR | Status: AC | PRN
  Administered 2018-08-16: 88 mg via INTRA_ARTICULAR

## 2018-08-16 NOTE — Progress Notes (Signed)
   Procedure Note  Patient: Wendy [name]             Date of Birth: 12-26-1950           MRN: [id number]             Visit Date: 08/16/2018  Procedures: Visit Diagnoses: Unilateral primary osteoarthritis, left knee - Plan: Large Joint Inj: R knee  Large Joint Inj: R knee on 08/16/2018 4:27 PM Indications: pain Details: 22 G needle, anterolateral approach Medications: 2 mL bupivacaine 0.25 %; 88 mg Hyaluronan 88 MG/4ML; 2 mL lidocaine 1 %

## 2018-10-09 ENCOUNTER — Other Ambulatory Visit: Payer: Self-pay | Admitting: Dietician

## 2018-10-09 DIAGNOSIS — E1169 Type 2 diabetes mellitus with other specified complication: Secondary | ICD-10-CM

## 2018-10-09 MED ORDER — ONETOUCH DELICA LANCETS 33G MISC: 3 refills | Status: DC

## 2018-10-09 MED ORDER — GLUCOSE BLOOD VI STRP: ORAL_STRIP | 12 refills | Status: DC

## 2018-10-09 NOTE — Telephone Encounter (Signed)
Ms Para called asking for prescriptions and about her Medicare B vs Medicare D cards. Answered her questions

## 2018-10-09 NOTE — Telephone Encounter (Signed)
Wants strips and lancets, felt a little funny so she checked her blood sugar; it was 162 after eating saltines. She reports her blood sugar is  Usually in the  200s. Realized she is almost out of strips and lancets and wonders if you can get symptoms of low blood sugar when her body is not used to being that low, but her blood sugar is not actually low?  Her Brunch today was Eggs, sausage, green tea  A few hours ago. We discussed that this can happen and I encouraged her to continue to check her blood sugar and eat healthy foods. Call back if this happens more often.  Request testing supply prescriptions. Suggest documenting in her chart that she needs to check two times a day and is checking two times a day due to hypoglycemic symptoms and  diabetes medication that can cause low blood sugars.  Debera Lat, RD 10/09/2018 1:40 PM.

## 2018-10-20 ENCOUNTER — Encounter (HOSPITAL_COMMUNITY): Payer: Self-pay

## 2018-10-20 ENCOUNTER — Inpatient Hospital Stay (HOSPITAL_COMMUNITY)
Admission: EM | Admit: 2018-10-20 | Discharge: 2018-10-21 | DRG: 065 | Disposition: A | Discharge status: Home / Self Care | Payer: Medicare Other | Attending: Internal Medicine | Admitting: Internal Medicine

## 2018-10-20 ENCOUNTER — Other Ambulatory Visit: Payer: Self-pay

## 2018-10-20 ENCOUNTER — Emergency Department (HOSPITAL_COMMUNITY): Payer: Medicare Other

## 2018-10-20 DIAGNOSIS — R8281 Pyuria: Secondary | ICD-10-CM | POA: Diagnosis present

## 2018-10-20 DIAGNOSIS — Q211 Atrial septal defect: Secondary | ICD-10-CM

## 2018-10-20 DIAGNOSIS — E785 Hyperlipidemia, unspecified: Secondary | ICD-10-CM | POA: Diagnosis present

## 2018-10-20 DIAGNOSIS — Z8673 Personal history of transient ischemic attack (TIA), and cerebral infarction without residual deficits: Secondary | ICD-10-CM | POA: Diagnosis not present

## 2018-10-20 DIAGNOSIS — I639 Cerebral infarction, unspecified: Secondary | ICD-10-CM | POA: Diagnosis not present

## 2018-10-20 DIAGNOSIS — Z8249 Family history of ischemic heart disease and other diseases of the circulatory system: Secondary | ICD-10-CM | POA: Diagnosis not present

## 2018-10-20 DIAGNOSIS — M1712 Unilateral primary osteoarthritis, left knee: Secondary | ICD-10-CM | POA: Diagnosis not present

## 2018-10-20 DIAGNOSIS — Z7982 Long term (current) use of aspirin: Secondary | ICD-10-CM | POA: Diagnosis not present

## 2018-10-20 DIAGNOSIS — I6522 Occlusion and stenosis of left carotid artery: Secondary | ICD-10-CM | POA: Diagnosis present

## 2018-10-20 DIAGNOSIS — I6521 Occlusion and stenosis of right carotid artery: Secondary | ICD-10-CM | POA: Diagnosis not present

## 2018-10-20 DIAGNOSIS — E1165 Type 2 diabetes mellitus with hyperglycemia: Secondary | ICD-10-CM | POA: Diagnosis present

## 2018-10-20 DIAGNOSIS — N183 Chronic kidney disease, stage 3 unspecified: Secondary | ICD-10-CM | POA: Diagnosis present

## 2018-10-20 DIAGNOSIS — R471 Dysarthria and anarthria: Secondary | ICD-10-CM | POA: Diagnosis present

## 2018-10-20 DIAGNOSIS — Z7984 Long term (current) use of oral hypoglycemic drugs: Secondary | ICD-10-CM | POA: Diagnosis not present

## 2018-10-20 DIAGNOSIS — Z1159 Encounter for screening for other viral diseases: Secondary | ICD-10-CM | POA: Diagnosis not present

## 2018-10-20 DIAGNOSIS — Z9114 Patient's other noncompliance with medication regimen: Secondary | ICD-10-CM | POA: Diagnosis not present

## 2018-10-20 DIAGNOSIS — R4701 Aphasia: Secondary | ICD-10-CM

## 2018-10-20 DIAGNOSIS — E119 Type 2 diabetes mellitus without complications: Secondary | ICD-10-CM | POA: Diagnosis present

## 2018-10-20 DIAGNOSIS — I63512 Cerebral infarction due to unspecified occlusion or stenosis of left middle cerebral artery: Secondary | ICD-10-CM | POA: Diagnosis not present

## 2018-10-20 DIAGNOSIS — E1122 Type 2 diabetes mellitus with diabetic chronic kidney disease: Secondary | ICD-10-CM | POA: Diagnosis present

## 2018-10-20 DIAGNOSIS — Z888 Allergy status to other drugs, medicaments and biological substances status: Secondary | ICD-10-CM

## 2018-10-20 DIAGNOSIS — R29705 NIHSS score 5: Secondary | ICD-10-CM | POA: Diagnosis present

## 2018-10-20 DIAGNOSIS — D860 Sarcoidosis of lung: Secondary | ICD-10-CM | POA: Diagnosis present

## 2018-10-20 DIAGNOSIS — N1831 Chronic kidney disease, stage 3a: Secondary | ICD-10-CM | POA: Diagnosis present

## 2018-10-20 DIAGNOSIS — I129 Hypertensive chronic kidney disease with stage 1 through stage 4 chronic kidney disease, or unspecified chronic kidney disease: Secondary | ICD-10-CM | POA: Diagnosis present

## 2018-10-20 DIAGNOSIS — Z03818 Encounter for observation for suspected exposure to other biological agents ruled out: Secondary | ICD-10-CM | POA: Diagnosis not present

## 2018-10-20 DIAGNOSIS — E11319 Type 2 diabetes mellitus with unspecified diabetic retinopathy without macular edema: Secondary | ICD-10-CM | POA: Diagnosis present

## 2018-10-20 DIAGNOSIS — Z87891 Personal history of nicotine dependence: Secondary | ICD-10-CM | POA: Diagnosis not present

## 2018-10-20 DIAGNOSIS — Z79899 Other long term (current) drug therapy: Secondary | ICD-10-CM

## 2018-10-20 DIAGNOSIS — E1159 Type 2 diabetes mellitus with other circulatory complications: Secondary | ICD-10-CM | POA: Diagnosis present

## 2018-10-20 DIAGNOSIS — I10 Essential (primary) hypertension: Secondary | ICD-10-CM | POA: Diagnosis present

## 2018-10-20 DIAGNOSIS — R4781 Slurred speech: Secondary | ICD-10-CM | POA: Diagnosis not present

## 2018-10-20 DIAGNOSIS — D869 Sarcoidosis, unspecified: Secondary | ICD-10-CM | POA: Diagnosis not present

## 2018-10-20 DIAGNOSIS — F801 Expressive language disorder: Secondary | ICD-10-CM | POA: Diagnosis not present

## 2018-10-20 HISTORY — DX: Personal history of transient ischemic attack (TIA), and cerebral infarction without residual deficits: Z86.73

## 2018-10-20 LAB — CBC
HCT: 39.7 % (ref 36.0–46.0)
Hemoglobin: 12.6 g/dL (ref 12.0–15.0)
MCH: 26.3 pg (ref 26.0–34.0)
MCHC: 31.7 g/dL (ref 30.0–36.0)
MCV: 82.7 fL (ref 80.0–100.0)
Platelets: 219 10*3/uL (ref 150–400)
RBC: 4.8 MIL/uL (ref 3.87–5.11)
RDW: 14.1 % (ref 11.5–15.5)
WBC: 6.6 10*3/uL (ref 4.0–10.5)
nRBC: 0 % (ref 0.0–0.2)

## 2018-10-20 LAB — COMPREHENSIVE METABOLIC PANEL
ALT: 11 U/L (ref 0–44)
AST: 16 U/L (ref 15–41)
Albumin: 3.4 g/dL — ABNORMAL LOW (ref 3.5–5.0)
Alkaline Phosphatase: 76 U/L (ref 38–126)
Anion gap: 11 (ref 5–15)
BUN: 31 mg/dL — ABNORMAL HIGH (ref 8–23)
CO2: 23 mmol/L (ref 22–32)
Calcium: 9.3 mg/dL (ref 8.9–10.3)
Chloride: 103 mmol/L (ref 98–111)
Creatinine, Ser: 1.78 mg/dL — ABNORMAL HIGH (ref 0.44–1.00)
GFR calc Af Amer: 33 mL/min — ABNORMAL LOW (ref >60)
GFR calc non Af Amer: 29 mL/min — ABNORMAL LOW (ref >60)
Glucose, Bld: 202 mg/dL — ABNORMAL HIGH (ref 70–99)
Potassium: 3.7 mmol/L (ref 3.5–5.1)
Sodium: 137 mmol/L (ref 135–145)
Total Bilirubin: 0.6 mg/dL (ref 0.3–1.2)
Total Protein: 7.6 g/dL (ref 6.5–8.1)

## 2018-10-20 LAB — PHOSPHORUS: Phosphorus: 2.9 mg/dL (ref 2.5–4.6)

## 2018-10-20 LAB — DIFFERENTIAL
Abs Immature Granulocytes: 0.02 10*3/uL (ref 0.00–0.07)
Basophils Absolute: 0 10*3/uL (ref 0.0–0.1)
Basophils Relative: 1 %
Eosinophils Absolute: 0.1 10*3/uL (ref 0.0–0.5)
Eosinophils Relative: 1 %
Immature Granulocytes: 0 %
Lymphocytes Relative: 28 %
Lymphs Abs: 1.9 10*3/uL (ref 0.7–4.0)
Monocytes Absolute: 0.5 10*3/uL (ref 0.1–1.0)
Monocytes Relative: 7 %
Neutro Abs: 4.1 10*3/uL (ref 1.7–7.7)
Neutrophils Relative %: 63 %

## 2018-10-20 LAB — PROTIME-INR
INR: 1.1 (ref 0.8–1.2)
Prothrombin Time: 13.7 seconds (ref 11.4–15.2)

## 2018-10-20 LAB — CBG MONITORING, ED: Glucose-Capillary: 193 mg/dL — ABNORMAL HIGH (ref 70–99)

## 2018-10-20 LAB — RAPID URINE DRUG SCREEN, HOSP PERFORMED
Amphetamines: NOT DETECTED
Barbiturates: NOT DETECTED
Benzodiazepines: NOT DETECTED
Cocaine: NOT DETECTED
Opiates: NOT DETECTED
Tetrahydrocannabinol: NOT DETECTED

## 2018-10-20 LAB — URINALYSIS, ROUTINE W REFLEX MICROSCOPIC
Bilirubin Urine: NEGATIVE
Glucose, UA: NEGATIVE mg/dL
Ketones, ur: NEGATIVE mg/dL
Nitrite: NEGATIVE
Protein, ur: NEGATIVE mg/dL
Specific Gravity, Urine: 1.012 (ref 1.005–1.030)
pH: 5 (ref 5.0–8.0)

## 2018-10-20 LAB — ETHANOL: Alcohol, Ethyl (B): <10 mg/dL (ref <10)

## 2018-10-20 LAB — I-STAT CREATININE, ED: Creatinine, Ser: 1.7 mg/dL — ABNORMAL HIGH (ref 0.44–1.00)

## 2018-10-20 LAB — APTT: aPTT: 31 seconds (ref 24–36)

## 2018-10-20 LAB — SARS CORONAVIRUS 2 BY RT PCR (HOSPITAL ORDER, PERFORMED IN ~~LOC~~ HOSPITAL LAB): SARS Coronavirus 2: NEGATIVE

## 2018-10-20 MED ORDER — VITAMIN D 25 MCG (1000 UNIT) PO TABS
1000.0000 [IU] | ORAL_TABLET | Freq: Every day | ORAL | Status: DC
  Administered 2018-10-21: 1000 [IU] via ORAL
  Filled 2018-10-20: qty 1

## 2018-10-20 MED ORDER — PROMETHAZINE HCL 25 MG PO TABS: 12.5000 mg | ORAL_TABLET | Freq: Four times a day (QID) | ORAL | Status: DC | PRN

## 2018-10-20 MED ORDER — HYDROCHLOROTHIAZIDE 25 MG PO TABS
25.0000 mg | ORAL_TABLET | Freq: Every day | ORAL | Status: DC
  Administered 2018-10-21: 25 mg via ORAL
  Filled 2018-10-20: qty 1

## 2018-10-20 MED ORDER — ASPIRIN 325 MG PO TABS
325.0000 mg | ORAL_TABLET | Freq: Every day | ORAL | Status: DC
  Administered 2018-10-20 – 2018-10-21 (×2): 325 mg via ORAL
  Filled 2018-10-20 (×2): qty 1

## 2018-10-20 MED ORDER — ACETAMINOPHEN 325 MG PO TABS: 650.0000 mg | ORAL_TABLET | Freq: Four times a day (QID) | ORAL | Status: DC | PRN

## 2018-10-20 MED ORDER — ATORVASTATIN CALCIUM 80 MG PO TABS: 80.0000 mg | ORAL_TABLET | Freq: Every day | ORAL | Status: DC

## 2018-10-20 MED ORDER — ACETAMINOPHEN 650 MG RE SUPP: 650.0000 mg | Freq: Four times a day (QID) | RECTAL | Status: DC | PRN

## 2018-10-20 MED ORDER — SENNOSIDES-DOCUSATE SODIUM 8.6-50 MG PO TABS: 1.0000 | ORAL_TABLET | Freq: Every evening | ORAL | Status: DC | PRN

## 2018-10-20 MED ORDER — STROKE: EARLY STAGES OF RECOVERY BOOK
Freq: Once | Status: AC
  Administered 2018-10-20: 23:00:00
  Filled 2018-10-20: qty 1

## 2018-10-20 MED ORDER — ASPIRIN 300 MG RE SUPP: 300.0000 mg | Freq: Every day | RECTAL | Status: DC

## 2018-10-20 MED ORDER — LORAZEPAM 2 MG/ML IJ SOLN
0.5000 mg | Freq: Once | INTRAMUSCULAR | Status: AC
  Administered 2018-10-20: 0.5 mg via INTRAVENOUS
  Filled 2018-10-20: qty 1

## 2018-10-20 MED ORDER — CLOPIDOGREL BISULFATE 75 MG PO TABS
75.0000 mg | ORAL_TABLET | Freq: Every day | ORAL | Status: DC
  Administered 2018-10-20 – 2018-10-21 (×2): 75 mg via ORAL
  Filled 2018-10-20 (×2): qty 1

## 2018-10-20 MED ORDER — HEPARIN SODIUM (PORCINE) 5000 UNIT/ML IJ SOLN
5000.0000 [IU] | Freq: Three times a day (TID) | INTRAMUSCULAR | Status: DC
  Administered 2018-10-20 – 2018-10-21 (×3): 5000 [IU] via SUBCUTANEOUS
  Filled 2018-10-20 (×3): qty 1

## 2018-10-20 MED ORDER — LISINOPRIL 5 MG PO TABS
5.0000 mg | ORAL_TABLET | Freq: Every day | ORAL | Status: DC
  Administered 2018-10-21: 5 mg via ORAL
  Filled 2018-10-20: qty 1

## 2018-10-20 NOTE — ED Notes (Signed)
ED TO INPATIENT HANDOFF REPORT  ED Nurse Name and Phone #: Nicolai Labonte [phone]  S Name/Age/Gender Wendy [name] 68 y.o. female Room/Bed: 039C/039C  Code Status   Code Status: Full Code  Home/SNF/Other Home Patient oriented to: self, place, situation, time Is this baseline? Yes   Triage Complete: Triage complete  Chief Complaint slurred speech  Triage Note Pt reports aphasia for the past 2 weeks, hx of prior stroke. No other deficits noted. No severe aphasia noted at this time, pt just slow to get out responses. Pt a.o, nad noted   Allergies Allergies  Allergen Reactions  . Metformin And Related Nausea Only    "severe itching"    Level of Care/Admitting Diagnosis ED Disposition    ED Disposition Condition [facility]: [place] [[id number]]  Level of Care: Telemetry Medical [104]  Covid Evaluation: N/A  Diagnosis: Acute ischemic stroke [facility]) [[id number]]  Admitting Physician: Bosie Helper  Attending Physician: Lucious Groves [[id number]]  Estimated length of stay: past midnight tomorrow  Certification:: I certify this patient will need inpatient services for at least 2 midnights  PT Class (Do Not Modify): Inpatient [101]  PT Acc Code (Do Not Modify): Private [1]       B Medical/Surgery History Past Medical History:  Diagnosis Date  . Diabetes mellitus   . Hypertension   . Sarcoidosis   . Stroke [facility])    Past Surgical History:  Procedure Laterality Date  . EP IMPLANTABLE DEVICE N/A 09/29/2015   Procedure: Loop Recorder Insertion;  Surgeon: Evans Lance, MD;  Location: [place] [facility];  Service: Cardiovascular;  Laterality: N/A;  . INTRAOCULAR LENS INSERTION    . TEE WITHOUT CARDIOVERSION N/A 09/29/2015   Procedure: TRANSESOPHAGEAL ECHOCARDIOGRAM (TEE);  Surgeon: Larey Dresser, MD;  Location: [place];  Service: Cardiovascular;  Laterality: N/A;     A IV Location/Drains/Wounds Patient Lines/Drains/Airways  Status   Active Line/Drains/Airways    Name:   Placement date:   Placement time:   Site:   Days:   Peripheral IV 10/20/18 Right Antecubital   10/20/18    1731    Antecubital   less than 1          Intake/Output Last 24 hours No intake or output data in the 24 hours ending 10/20/18 2143  Labs/Imaging Results for orders placed or performed during the hospital encounter of 10/20/18 (from the past 48 hour(s))  CBG monitoring, ED     Status: Abnormal   Collection Time: 10/20/18  5:19 PM  Result Value Ref Range   Glucose-Capillary 193 (H) 70 - 99 mg/dL  Ethanol     Status: None   Collection Time: 10/20/18  5:31 PM  Result Value Ref Range   Alcohol, Ethyl (B) <10 <10 mg/dL    Comment: (NOTE) Lowest detectable limit for serum alcohol is 10 mg/dL. For medical purposes only. Performed at [facility], [place] [address] [place], [place] [address]   Protime-INR     Status: None   Collection Time: 10/20/18  5:31 PM  Result Value Ref Range   Prothrombin Time 13.7 11.4 - 15.2 seconds   INR 1.1 0.8 - 1.2    Comment: (NOTE) INR goal varies based on device and disease states. Performed at [facility], [place] [address] [place],  [address]   APTT     Status: None   Collection Time: 10/20/18  5:31 PM  Result Value  Ref Range   aPTT 31 24 - 36 seconds    Comment: Performed at  [address], [place], [place] [address]  CBC     Status: None   Collection Time: 10/20/18  5:31 PM  Result Value Ref Range   WBC 6.6 4.0 - 10.5 K/uL   RBC 4.80 3.87 - 5.11 MIL/uL   Hemoglobin 12.6 12.0 - 15.0 g/dL   HCT 39.7 36.0 - 46.0 %   MCV 82.7 80.0 - 100.0 fL   MCH 26.3 26.0 - 34.0 pg   MCHC 31.7 30.0 - 36.0 g/dL   RDW 14.1 11.5 - 15.5 %   Platelets 219 150 - 400 K/uL   nRBC 0.0 0.0 - 0.2 %    Comment: Performed at [facility], [place] [address], [place], [place] [address]  Differential     Status: None   Collection Time: 10/20/18  5:31 PM  Result Value Ref  Range   Neutrophils Relative % 63 %   Neutro Abs 4.1 1.7 - 7.7 K/uL   Lymphocytes Relative 28 %   Lymphs Abs 1.9 0.7 - 4.0 K/uL   Monocytes Relative 7 %   Monocytes Absolute 0.5 0.1 - 1.0 K/uL   Eosinophils Relative 1 %   Eosinophils Absolute 0.1 0.0 - 0.5 K/uL   Basophils Relative 1 %   Basophils Absolute 0.0 0.0 - 0.1 K/uL   Immature Granulocytes 0 %   Abs Immature Granulocytes 0.02 0.00 - 0.07 K/uL    Comment: Performed at [place] [address] [place], [place] [address]  Comprehensive metabolic panel     Status: Abnormal   Collection Time: 10/20/18  5:31 PM  Result Value Ref Range   Sodium 137 135 - 145 mmol/L   Potassium 3.7 3.5 - 5.1 mmol/L   Chloride 103 98 - 111 mmol/L   CO2 23 22 - 32 mmol/L   Glucose, Bld 202 (H) 70 - 99 mg/dL   BUN 31 (H) 8 - 23 mg/dL   Creatinine, Ser 1.78 (H) 0.44 - 1.00 mg/dL   Calcium 9.3 8.9 - 10.3 mg/dL   Total Protein 7.6 6.5 - 8.1 g/dL   Albumin 3.4 (L) 3.5 - 5.0 g/dL   AST 16 15 - 41 U/L   ALT 11 0 - 44 U/L   Alkaline Phosphatase 76 38 - 126 U/L   Total Bilirubin 0.6 0.3 - 1.2 mg/dL   GFR calc non Af Amer 29 (L) >60 mL/min   GFR calc Af Amer 33 (L) >60 mL/min   Anion gap 11 5 - 15    Comment: Performed at [place] [address] [place], [place] [address]  I-stat Creatinine, ED     Status: Abnormal   Collection Time: 10/20/18  5:53 PM  Result Value Ref Range   Creatinine, Ser 1.70 (H) 0.44 - 1.00 mg/dL  Urine rapid drug screen (hosp performed)     Status: None   Collection Time: 10/20/18  7:20 PM  Result Value Ref Range   Opiates NONE DETECTED NONE DETECTED   Cocaine NONE DETECTED NONE DETECTED   Benzodiazepines NONE DETECTED NONE DETECTED   Amphetamines NONE DETECTED NONE DETECTED   Tetrahydrocannabinol NONE DETECTED NONE DETECTED   Barbiturates NONE DETECTED NONE DETECTED    Comment: (NOTE) DRUG SCREEN FOR MEDICAL PURPOSES ONLY.  IF CONFIRMATION IS NEEDED FOR ANY PURPOSE, NOTIFY LAB WITHIN 5  DAYS. LOWEST DETECTABLE LIMITS FOR URINE DRUG SCREEN Drug  Class                     Cutoff (ng/mL) Amphetamine and metabolites    1000 Barbiturate and metabolites    200 Benzodiazepine                 151 Tricyclics and metabolites     300 Opiates and metabolites        300 Cocaine and metabolites        300 THC                            50 Performed at [facility], [place] [address] [place], [place] [address]   Urinalysis, Routine w reflex microscopic     Status: Abnormal   Collection Time: 10/20/18  7:20 PM  Result Value Ref Range   Color, Urine YELLOW YELLOW   APPearance CLEAR CLEAR   Specific Gravity, Urine 1.012 1.005 - 1.030   pH 5.0 5.0 - 8.0   Glucose, UA NEGATIVE NEGATIVE mg/dL   Hgb urine dipstick SMALL (A) NEGATIVE   Bilirubin Urine NEGATIVE NEGATIVE   Ketones, ur NEGATIVE NEGATIVE mg/dL   Protein, ur NEGATIVE NEGATIVE mg/dL   Nitrite NEGATIVE NEGATIVE   Leukocytes,Ua SMALL (A) NEGATIVE   RBC / HPF 0-5 0 - 5 RBC/hpf   WBC, UA 0-5 0 - 5 WBC/hpf   Bacteria, UA RARE (A) NONE SEEN   Squamous Epithelial / LPF 0-5 0 - 5   Hyaline Casts, UA PRESENT    Non Squamous Epithelial 0-5 (A) NONE SEEN    Comment: Performed at [facility], [place] [address], [place], [place] [address]  SARS Coronavirus 2 [facility] order, Performed in [facility] hospital lab)     Status: None   Collection Time: 10/20/18  7:45 PM  Result Value Ref Range   SARS Coronavirus 2 NEGATIVE NEGATIVE    Comment: (NOTE) If result is NEGATIVE SARS-CoV-2 target nucleic acids are NOT DETECTED. The SARS-CoV-2 RNA is generally detectable in upper and lower  respiratory specimens during the acute phase of infection. The lowest  concentration of SARS-CoV-2 viral copies this assay can detect is 250  copies / mL. A negative result does not preclude SARS-CoV-2 infection  and should not be used as the sole basis for treatment or other  patient management decisions.  A negative result may occur with   improper specimen collection / handling, submission of specimen other  than nasopharyngeal swab, presence of viral mutation(s) within the  areas targeted by this assay, and inadequate number of viral copies  (<250 copies / mL). A negative result must be combined with clinical  observations, patient history, and epidemiological information. If result is POSITIVE SARS-CoV-2 target nucleic acids are DETECTED. The SARS-CoV-2 RNA is generally detectable in upper and lower  respiratory specimens dur ing the acute phase of infection.  Positive  results are indicative of active infection with SARS-CoV-2.  Clinical  correlation with patient history and other diagnostic information is  necessary to determine patient infection status.  Positive results do  not rule out bacterial infection or co-infection with other viruses. If result is PRESUMPTIVE POSTIVE SARS-CoV-2 nucleic acids MAY BE PRESENT.   A presumptive positive result was obtained on the submitted specimen  and confirmed on repeat testing.  While 2019 novel coronavirus  (SARS-CoV-2) nucleic acids may be present in the submitted sample  additional confirmatory testing may be necessary  for epidemiological  and / or clinical management purposes  to differentiate between  SARS-CoV-2 and other Sarbecovirus currently known to infect humans.  If clinically indicated additional testing with an alternate test  methodology [phone]) is advised. The SARS-CoV-2 RNA is generally  detectable in upper and lower respiratory sp ecimens during the acute  phase of infection. The expected result is Negative. Fact Sheet for Patients:  StrictlyIdeas.no Fact Sheet for Healthcare Providers: BankingDealers.co.za This test is not yet approved or cleared by the [place] FDA and has been authorized for detection and/or diagnosis of SARS-CoV-2 by FDA under an Emergency Use Authorization (EUA).  This EUA will  remain in effect (meaning this test can be used) for the duration of the COVID-19 declaration under Section 564(b)(1) of the Act, 21 U.S.C. section 360bbb-3(b)(1), unless the authorization is terminated or revoked sooner. Performed at [facility], [place] [address] [place], [place] [address]    Mr Brain 64 Contrast  Result Date: 10/20/2018 CLINICAL DATA:  Initial evaluation for acute aphasia for past 2 weeks. History of prior stroke. EXAM: MRI HEAD WITHOUT CONTRAST TECHNIQUE: Multiplanar, multiecho pulse sequences of the brain and surrounding structures were obtained without intravenous contrast. COMPARISON:  Prior MRI from 09/27/2015 FINDINGS: Brain: Generalized age-related cerebral atrophy. Patchy and confluent T2/FLAIR hyperintensity within the periventricular deep white matter both cerebral hemispheres most consistent with chronic small vessel ischemic disease, moderate nature. Encephalomalacia with gliosis within the left parietal lobe compatible with remote left MCA territory infarct. Patchy small volume foci of restricted diffusion seen involving the cortical and subcortical aspect of the posterior left frontoparietal region, compatible with acute left MCA territory infarct. Largest area of involvement seen at the posterior left centrum semi ovale and measures 16 mm in length (series 5, image 87). No associated hemorrhage or mass effect. No other evidence for acute or subacute ischemia. Gray-white matter differentiation otherwise maintained. No evidence for acute intracranial hemorrhage. Single chronic microhemorrhage noted within the left periatrial white matter, likely small vessel related. No mass lesion, midline shift or mass effect. No hydrocephalus. No extra-axial fluid collection. Pituitary gland suprasellar region normal. Midline structures intact. Vascular: Distal left MCA branches somewhat attenuated as compared to the right. Major intracranial vascular flow voids otherwise  maintained. Skull and upper cervical spine: Craniocervical junction within normal limits. Scattered multilevel degenerative spondylolysis noted within the upper cervical spine without significant stenosis. Bone marrow signal intensity diffusely decreased on T1 weighted imaging, most commonly related to anemia, smoking, or obesity. No focal marrow replacing lesion. No scalp soft tissue abnormality. Sinuses/Orbits: Globes and orbital soft tissues within normal limits. Paranasal sinuses are clear. No significant mastoid effusion. Inner ear structures normal. Other: None. IMPRESSION: 1. Patchy small volume acute ischemic infarcts involving the posterior left frontal region, left MCA distribution. No associated hemorrhage or mass effect. 2. Underlying chronic left posterior MCA territory infarct involving the left parietal lobe. 3. Moderate chronic microvascular ischemic disease. Electronically Signed   By: Jeannine Boga M.D.   On: 10/20/2018 19:31    Pending Labs Unresulted Labs (From admission, onward)    Start     Ordered   10/21/18 5974  Basic metabolic panel  Tomorrow morning,   R     10/20/18 2113   10/21/18 0500  CBC  Tomorrow morning,   R     10/20/18 2113   10/21/18 0500  Hemoglobin A1c  Tomorrow morning,   R     10/20/18 2131   10/21/18 0500  Lipid  panel  Tomorrow morning,   R    Comments:  Fasting    10/20/18 2131   10/20/18 1915  HIV antibody (Routine Testing)  Once,   R     10/20/18 1914          Vitals/Pain Today's Vitals   10/20/18 1935 10/20/18 1952 10/20/18 2030 10/20/18 2045  BP: (!) 179/88  (!) 187/101 (!) 169/90  Pulse: 87  87 79  Resp: 15  (!) 22 19  Temp:      TempSrc:      SpO2: 97%  100% 100%  PainSc:  8       Isolation Precautions No active isolations  Medications Medications  heparin injection 5,000 Units (has no administration in time range)  acetaminophen (TYLENOL) tablet 650 mg (has no administration in time range)    Or  acetaminophen  (TYLENOL) suppository 650 mg (has no administration in time range)  senna-docusate (Senokot-S) tablet 1 tablet (has no administration in time range)  promethazine (PHENERGAN) tablet 12.5 mg (has no administration in time range)   stroke: mapping our early stages of recovery book (has no administration in time range)  aspirin suppository 300 mg (has no administration in time range)    Or  aspirin tablet 325 mg (has no administration in time range)  clopidogrel (PLAVIX) tablet 75 mg (has no administration in time range)  cholecalciferol (VITAMIN D3) tablet 1,000 Units (has no administration in time range)  hydrochlorothiazide (HYDRODIURIL) tablet 25 mg (has no administration in time range)  LORazepam (ATIVAN) injection 0.5 mg (0.5 mg Intravenous Given 10/20/18 1813)    Mobility walks with person assist Moderate fall risk   Focused Assessments Neuro Assessment Handoff:  Swallow screen pass? Yes  Cardiac Rhythm: Normal sinus rhythm NIH Stroke Scale ( + Modified Stroke Scale Criteria)  Interval: Initial Level of Consciousness (1a.)   : Alert, keenly responsive LOC Questions (1b. )   +: Answers both questions correctly LOC Commands (1c. )   + : Performs both tasks correctly Best Gaze (2. )  +: Normal Visual (3. )  +: No visual loss Facial Palsy (4. )    : Normal symmetrical movements Motor Arm, Left (5a. )   +: No drift Motor Arm, Right (5b. )   +: No drift Motor Leg, Left (6a. )   +: No drift Motor Leg, Right (6b. )   +: No drift Limb Ataxia (7. ): Absent Sensory (8. )   +: Normal, no sensory loss Best Language (9. )   +: Mild-to-moderate aphasia Dysarthria (10. ): Normal Extinction/Inattention (11.)   +: No Abnormality Modified SS Total  +: 1 Complete NIHSS TOTAL: 1     Neuro Assessment: Within Defined Limits Neuro Checks:   Initial (10/20/18 1724)  Last Documented NIHSS Modified Score: 1 (10/20/18 1930) Has TPA been given? No If patient is a Neuro Trauma and patient is  going to OR before floor call report to [place] nurse: [phone] or [phone]     R Recommendations: See Admitting Provider Note  Report given to:   Additional Notes:

## 2018-10-20 NOTE — ED Provider Notes (Signed)
[facility] EMERGENCY DEPARTMENT Provider Note   CSN: [id number] Arrival date & time: 10/20/18  1703    History   Chief Complaint Chief Complaint  Patient presents with   Aphasia    HPI Wendy [name] is a 68 y.o. female.     The history is provided by the patient and medical records. No language interpreter was used.  Neurologic Problem  This is a recurrent problem. The current episode started more than 1 week ago. The problem occurs constantly. The problem has not changed since onset.Associated symptoms include headaches (occasionally, not currently). Pertinent negatives include no chest pain, no abdominal pain and no shortness of breath. Nothing aggravates the symptoms. Nothing relieves the symptoms. She has tried nothing for the symptoms. The treatment provided no relief.    Past Medical History:  Diagnosis Date   Diabetes mellitus    Hypertension    Sarcoidosis    Stroke [facility])     Patient Active Problem List   Diagnosis Date Noted   Unilateral primary osteoarthritis, right hip 05/30/2018   Peroneal tendinitis, right leg 05/30/2018   Acute right ankle pain 05/25/2018   Pain of right hip joint 03/10/2018   History of CVA (cerebrovascular accident) 09/26/2015   Chronic kidney disease 10/14/2014   Left arm pain 10/14/2014   Chronic right-sided low back pain 02/11/2014   Background diabetic retinopathy(362.01) 07/25/2012   Screening mammogram, encounter for 06/29/2012   Sarcoidosis of lung ([place]) 09/30/2011   Hyperlipidemia 08/10/2011   Diabetes ([place]) 06/26/2008   Essential hypertension, benign 06/26/2008   Primary localized osteoarthritis of left knee 06/26/2008    Past Surgical History:  Procedure Laterality Date   EP IMPLANTABLE DEVICE N/A 09/29/2015   Procedure: Loop Recorder Insertion;  Surgeon: Evans Lance, MD;  Location: [place] CV LAB;  Service: Cardiovascular;  Laterality: N/A;   INTRAOCULAR LENS INSERTION      TEE WITHOUT CARDIOVERSION N/A 09/29/2015   Procedure: TRANSESOPHAGEAL ECHOCARDIOGRAM (TEE);  Surgeon: Larey Dresser, MD;  Location: [facility] ENDOSCOPY;  Service: Cardiovascular;  Laterality: N/A;     OB History    Gravida  4   Para  4   Term      Preterm      AB      Living  4     SAB      TAB      Ectopic      Multiple      Live Births               Home Medications    Prior to Admission medications   Medication Sig Start Date End Date Taking? Authorizing Provider  acetaminophen (TYLENOL 8 HOUR) 650 MG CR tablet Take 1 tablet (650 mg total) by mouth every 8 (eight) hours as needed for pain. 03/09/18   Lorella Nimrod, MD  aspirin EC 81 MG tablet Take 1 tablet (81 mg total) by mouth daily. 10/24/17 10/24/18  Thomasene Ripple, MD  Blood Glucose Monitoring Suppl (ACCU-CHEK GUIDE) w/Device KIT 1 each by Does not apply route 2 (two) times daily. 01/18/18   Axel Filler, MD  diclofenac sodium (VOLTAREN) 1 % GEL Apply 2 g topically 4 (four) times daily. 05/30/18   Aundra Dubin, PA-C  glipiZIDE (GLUCOTROL) 5 MG tablet Take 1 tablet (5 mg total) by mouth 2 (two) times daily before a meal. 10/24/17 10/24/18  Nedrud, Larena Glassman, MD  glucose blood (ONETOUCH VERIO) test strip Check blood sugar two times a  day 10/09/18   Asencion Noble, MD  hydrochlorothiazide (HYDRODIURIL) 25 MG tablet Take 1 tablet (25 mg total) by mouth daily. 05/15/18   Asencion Noble, MD  Lancet Device MISC For True Track meter - use to check blood sugars once a week. Dx code: 7.00. 02/20/13   Cater, Wynelle Bourgeois, MD  naproxen (NAPROSYN) 500 MG tablet Take 1 tablet (500 mg total) by mouth 2 (two) times daily with a meal. 05/25/18   Neva Seat, MD  Nutritional Supplements (NUTRITIONAL SUPPLEMENT PO) Take 1 tablet by mouth daily. High Blood Sugar.    [provider]  OneTouch Delica Lancets 96Q MISC Check blood sugar two times a day 10/09/18   Asencion Noble, MD    Family History Family  History  Problem Relation Age of Onset   Hypertension Son     Social History Social History   Tobacco Use   Smoking status: Former Smoker    Last attempt to quit: 08/09/1976    Years since quitting: 42.2   Smokeless tobacco: Never Used  Substance Use Topics   Alcohol use: No    Alcohol/week: 0.0 standard drinks   Drug use: No     Allergies   Metformin and related   Review of Systems Review of Systems  Constitutional: Negative for chills, diaphoresis, fatigue and fever.  HENT: Negative for congestion.   Eyes: Negative for photophobia and visual disturbance.  Respiratory: Negative for cough and shortness of breath.   Cardiovascular: Negative for chest pain.  Gastrointestinal: Negative for abdominal pain, constipation, diarrhea, nausea and vomiting.  Genitourinary: Negative for flank pain and frequency.  Musculoskeletal: Positive for gait problem. Negative for back pain, neck pain and neck stiffness.  Skin: Negative for rash and wound.  Neurological: Positive for speech difficulty and headaches (occasionally, not currently). Negative for dizziness, seizures, facial asymmetry, light-headedness and numbness.  Psychiatric/Behavioral: Negative for agitation.  All other systems reviewed and are negative.    Physical Exam Updated Vital Signs Temp 98.3 F (36.8 C) (Oral)   Physical Exam Vitals signs and nursing note reviewed.  Constitutional:      General: She is not in acute distress.    Appearance: She is well-developed. She is not ill-appearing, toxic-appearing or diaphoretic.  HENT:     Head: Normocephalic and atraumatic.     Nose: No congestion or rhinorrhea.     Mouth/Throat:     Pharynx: No oropharyngeal exudate or posterior oropharyngeal erythema.  Eyes:     General: No visual field deficit.    Conjunctiva/sclera: Conjunctivae normal.  Neck:     Musculoskeletal: Neck supple.  Cardiovascular:     Rate and Rhythm: Normal rate and regular rhythm.      Pulses: Normal pulses.     Heart sounds: No murmur.  Pulmonary:     Effort: Pulmonary effort is normal. No respiratory distress.     Breath sounds: Normal breath sounds. No wheezing, rhonchi or rales.  Chest:     Chest wall: No tenderness.  Abdominal:     General: Abdomen is flat.     Palpations: Abdomen is soft.     Tenderness: There is no abdominal tenderness.  Musculoskeletal:        General: No tenderness.  Skin:    General: Skin is warm and dry.     Capillary Refill: Capillary refill takes less than 2 seconds.  Neurological:     Mental Status: She is alert and oriented to person, place, and time.  GCS: GCS eye subscore is 4. GCS verbal subscore is 5. GCS motor subscore is 6.     Cranial Nerves: No cranial nerve deficit, dysarthria or facial asymmetry.     Sensory: Sensation is intact. No sensory deficit.     Motor: Motor function is intact. No weakness, tremor, abnormal muscle tone or pronator drift.     Coordination: Romberg sign positive. Finger-Nose-Finger Test normal.     Gait: Gait abnormal.     Comments: Patient has expressive aphasia.  No dysarthria on my exam.  Normal visual fields and strength in extremities.  Patient unsteady with [place] and had unsteady gait.       ED Treatments / Results  Labs (all labs ordered are listed, but only abnormal results are displayed) Labs Reviewed  COMPREHENSIVE METABOLIC PANEL - Abnormal; Notable for the following components:      Result Value   Glucose, Bld 202 (*)    BUN 31 (*)    Creatinine, Ser 1.78 (*)    Albumin 3.4 (*)    GFR calc non Af Amer 29 (*)    GFR calc Af Amer 33 (*)    All other components within normal limits  URINALYSIS, ROUTINE W REFLEX MICROSCOPIC - Abnormal; Notable for the following components:   Hgb urine dipstick SMALL (*)    Leukocytes,Ua SMALL (*)    Bacteria, UA RARE (*)    Non Squamous Epithelial 0-5 (*)    All other components within normal limits  I-STAT CREATININE, ED - Abnormal;  Notable for the following components:   Creatinine, Ser 1.70 (*)    All other components within normal limits  CBG MONITORING, ED - Abnormal; Notable for the following components:   Glucose-Capillary 193 (*)    All other components within normal limits  SARS CORONAVIRUS 2 (HOSPITAL ORDER, [place] [facility])  URINE CULTURE  ETHANOL  PROTIME-INR  APTT  CBC  DIFFERENTIAL  RAPID URINE DRUG SCREEN, HOSP PERFORMED  PHOSPHORUS  HIV ANTIBODY (ROUTINE TESTING W REFLEX)  BASIC METABOLIC PANEL  CBC  HEMOGLOBIN A1C  LIPID PANEL    EKG EKG Interpretation  Date/Time:  Friday Oct 20 2018 17:22:09 EDT Ventricular Rate:  87 PR Interval:    QRS Duration: 90 QT Interval:  379 QTC Calculation: 456 R Axis:   9 Text Interpretation:  Sinus rhythm When compared to prior, t wave now upright in lead 3.  'No STEMI Confirmed by Antony Blackbird [id number]) on 10/20/2018 5:34:48 PM   Radiology Mr Brain Wo Contrast  Result Date: 10/20/2018 CLINICAL DATA:  Initial evaluation for acute aphasia for past 2 weeks. History of prior stroke. EXAM: MRI HEAD WITHOUT CONTRAST TECHNIQUE: Multiplanar, multiecho pulse sequences of the brain and surrounding structures were obtained without intravenous contrast. COMPARISON:  Prior MRI from 09/27/2015 FINDINGS: Brain: Generalized age-related cerebral atrophy. Patchy and confluent T2/FLAIR hyperintensity within the periventricular deep white matter both cerebral hemispheres most consistent with chronic small vessel ischemic disease, moderate nature. Encephalomalacia with gliosis within the left parietal lobe compatible with remote left MCA territory infarct. Patchy small volume foci of restricted diffusion seen involving the cortical and subcortical aspect of the posterior left frontoparietal region, compatible with acute left MCA territory infarct. Largest area of involvement seen at the posterior left centrum semi ovale and measures 16 mm in length (series 5,  image 87). No associated hemorrhage or mass effect. No other evidence for acute or subacute ischemia. Gray-white matter differentiation otherwise maintained. No evidence for acute intracranial  hemorrhage. Single chronic microhemorrhage noted within the left periatrial white matter, likely small vessel related. No mass lesion, midline shift or mass effect. No hydrocephalus. No extra-axial fluid collection. Pituitary gland suprasellar region normal. Midline structures intact. Vascular: Distal left MCA branches somewhat attenuated as compared to the right. Major intracranial vascular flow voids otherwise maintained. Skull and upper cervical spine: Craniocervical junction within normal limits. Scattered multilevel degenerative spondylolysis noted within the upper cervical spine without significant stenosis. Bone marrow signal intensity diffusely decreased on T1 weighted imaging, most commonly related to anemia, smoking, or obesity. No focal marrow replacing lesion. No scalp soft tissue abnormality. Sinuses/Orbits: Globes and orbital soft tissues within normal limits. Paranasal sinuses are clear. No significant mastoid effusion. Inner ear structures normal. Other: None. IMPRESSION: 1. Patchy small volume acute ischemic infarcts involving the posterior left frontal region, left MCA distribution. No associated hemorrhage or mass effect. 2. Underlying chronic left posterior MCA territory infarct involving the left parietal lobe. 3. Moderate chronic microvascular ischemic disease. Electronically Signed   By: Jeannine Boga M.D.   On: 10/20/2018 19:31    Procedures Procedures (including critical care time)  Medications Ordered in ED Medications  heparin injection 5,000 Units (5,000 Units Subcutaneous Given 10/20/18 2300)  acetaminophen (TYLENOL) tablet 650 mg (has no administration in time range)    Or  acetaminophen (TYLENOL) suppository 650 mg (has no administration in time range)  senna-docusate (Senokot-S)  tablet 1 tablet (has no administration in time range)  promethazine (PHENERGAN) tablet 12.5 mg (has no administration in time range)  aspirin suppository 300 mg ( Rectal See Alternative 10/20/18 2308)    Or  aspirin tablet 325 mg (325 mg Oral Given 10/20/18 2308)  clopidogrel (PLAVIX) tablet 75 mg (75 mg Oral Given 10/20/18 2308)  cholecalciferol (VITAMIN D3) tablet 1,000 Units (has no administration in time range)  hydrochlorothiazide (HYDRODIURIL) tablet 25 mg (has no administration in time range)  atorvastatin (LIPITOR) tablet 80 mg (has no administration in time range)  lisinopril (ZESTRIL) tablet 5 mg (has no administration in time range)  LORazepam (ATIVAN) injection 0.5 mg (0.5 mg Intravenous Given 10/20/18 1813)   stroke: mapping our early stages of recovery book ( Does not apply Given 10/20/18 2300)     Initial Impression / Assessment and Plan / ED Course  I have reviewed the triage vital signs and the nursing notes.  Pertinent labs & imaging results that were available during my care of the patient were reviewed by me and considered in my medical decision making (see chart for details).        Wendy [name] is a 68 y.o. female with a past medical history significant for hypertension, hyperlipidemia, diabetes, sarcoidosis, CKD, and prior stroke who presents with 2 weeks of speech abnormality and unsteadiness.  Patient reports that her symptoms are similar to when she had a stroke several years ago although she reports before the last 2 weeks she had no residual stroke symptoms.  She says that she was scared, the emergency department at hospital and she is avoided medical evaluation since they began.  She denies any constant headache with the symptoms and reports they have been constant.  She reports she is having some slurred speech but primarily she cannot get what she wants to say out and what appears to be expressive aphasia.  She denies any nausea, vomiting, constipation, diarrhea.  No  fevers, chills, urinary symptoms.  No congestion, cough, chest pain, abdominal pain.  She denies any recent falls but  she reports feeling unsteady and having to hold onto things.  She reports this is new and different than her normal gait.  She is concerned she had a stroke.  On exam, patient had instability with ambulation and nearly fell standing still with her eyes closed.  She had clear lungs and nontender chest, abdomen was nontender.  Patient normal sensation strength in extremities and has symmetric smile.  Pupils were symmetric and reactive with normal extraocular movements and she had intact visual fields.  Patient's speech was a phasic although I did not appreciate dysarthria on my exam.  Finger-nose-finger testing was normal bilaterally.  Clinically I am concerned patient had another stroke.  Patient is not having headache currently and her symptoms have been ongoing for the last 2 weeks thus, will initially start with a MRI of the brain instead of a head CT.  Anticipate touching base with neurology after work-up.      MRI does show stroke.  Neurology request patient be admitted to medicine service and they will see her.  Patient admitted for further management of stroke.    Final Clinical Impressions(s) / ED Diagnoses   Final diagnoses:  Expressive aphasia  Cerebrovascular accident (CVA), unspecified mechanism [facility])    ED Discharge Orders    None     Clinical Impression: 1. Expressive aphasia   2. Cerebrovascular accident (CVA), unspecified mechanism ([place])     Disposition: Admit  This note was prepared with assistance of Dragon voice recognition software. Occasional wrong-word or sound-a-like substitutions may have occurred due to the inherent limitations of voice recognition software.     Charna Neeb, Gwenyth Allegra, MD 10/20/18 [phone]

## 2018-10-20 NOTE — ED Notes (Signed)
Patient transported to MRI 

## 2018-10-20 NOTE — Progress Notes (Signed)
Date: 10/20/2018               Patient Name:  Wendy [name] MRN: [id number]  DOB: 1951-03-07 Age / Sex: 68 y.o., female   PCP: Wendy Noble, MD         Medical Service: Internal Medicine Teaching Service         Attending Physician: Dr. Lucious Groves, DO    First Contact: Dr. Donne Hazel Pager: [phone]  Second Contact: Dr. Frederico Hamman Pager: [phone]       After Hours (After 5p/  First Contact Pager: [phone]  weekends / holidays): Second Contact Pager: [phone]   Chief Complaint: Slurred speech and word finding difficulties  History of Present Illness: Wendy [name] is a 68 y.o f with essential hypertension, diabetes, prior embolic left mca infarct, ckd stage III, sarcoidosis, osteoarthritis of left knee who presented with slurred speech and word finding difficulties.   Two weeks ago she started noticing slurred speech, trouble finding words, and trouble concentrating. These symptoms remained constant since onset. She did not want to come to the hospital for fear of COVID-19. She did become concerned today as these symptoms were not improving and she came to the ED. Associated symptoms include intermittent hear racing but states that this is a chronic issue. She also reports right ankle pain that she believes is a flare up of her osteoarthritis. She uses a walker to get around and can get up with assistance. She denies nausea, vomiting, abdominal pain, and changes in bowel movements. She reports intermittent dysuria (last episode yesterday) but denies hematuria, chills, and fevers.   She had an embolic left MCA branch infarct in 2017. She reports similar symptoms of of aphasia and word-finding difficulties but does report complete resolution of these symptoms within several days of hospitalization. Loop recorder was placed  3-4 years ago but has not shown any abnormalities per Wendy [name]. Her TEE showed a questionable PFO but no clot or definitive cardiac source for embolism.   In the ED she  was found to be hypertensive with systolics into the 428J.  Other vital signs were otherwise unremarkable.  Labs were significant for hematuria, pyuria, rare bacteria.  She had elevated creatinine of 1.78 (baseline appears to vary between 1.6-1.65).  CBC unremarkable.  CBG 193.  MRI head showed acute ischemic infarcts involving the posterior left frontal region, left MCA distribution.  No associated hemorrhage or mass-effect.   Meds:  Current Facility-Administered Medications for the 10/20/18 encounter [facility] Encounter)  Medication  . methylPREDNISolone acetate (DEPO-MEDROL) injection 40 mg   Current Meds  Medication Sig  . cholecalciferol (VITAMIN D3) 25 MCG (1000 UT) tablet Take 1,000 Units by mouth daily.  Marland Kitchen CINNAMON PO Take 1 capsule by mouth daily.  Marland Kitchen ELDERBERRY PO Take 1 capsule by mouth daily.  Marland Kitchen glipiZIDE (GLUCOTROL) 5 MG tablet Take 1 tablet (5 mg total) by mouth 2 (two) times daily before a meal.  . hydrochlorothiazide (HYDRODIURIL) 25 MG tablet Take 1 tablet (25 mg total) by mouth daily.  Marland Kitchen OVER THE COUNTER MEDICATION Take 0.5 drops by mouth daily. Silver supplement  . OVER THE COUNTER MEDICATION Take 1 capsule by mouth daily. Coconut supplement     Allergies: Allergies as of 10/20/2018 - Review Complete 10/20/2018  Allergen Reaction Noted  . Metformin and related Nausea Only 07/24/2010   Past Medical History:  Diagnosis Date  . Diabetes mellitus   . Hypertension   . Sarcoidosis   . Stroke (  [place])     Family History:   Social History   Socioeconomic History  . Marital status: Married    Spouse name: Not on file  . Number of children: Not on file  . Years of education: Not on file  . Highest education level: Not on file  Occupational History  . Not on file  Social Needs  . Financial resource strain: Not on file  . Food insecurity:    Worry: Not on file    Inability: Not on file  . Transportation needs:    Medical: Not on file    Non-medical: Not on file   Tobacco Use  . Smoking status: Former Smoker    Last attempt to quit: 08/09/1976    Years since quitting: 42.2  . Smokeless tobacco: Never Used  Substance and Sexual Activity  . Alcohol use: No    Alcohol/week: 0.0 standard drinks  . Drug use: No  . Sexual activity: Not on file  Lifestyle  . Physical activity:    Days per week: Not on file    Minutes per session: Not on file  . Stress: Not on file  Relationships  . Social connections:    Talks on phone: Not on file    Gets together: Not on file    Attends religious service: Not on file    Active member of club or organization: Not on file    Attends meetings of clubs or organizations: Not on file    Relationship status: Not on file  . Intimate partner violence:    Fear of current or ex partner: Not on file    Emotionally abused: Not on file    Physically abused: Not on file    Forced sexual activity: Not on file  Other Topics Concern  . Not on file  Social History Narrative  . Not on file    Social History: She lives at home with her husband. She is retired and used to work as a job Human resources officer. She is a prior smoker quit 40 yrs ago. Denies current etoh user, no drug use.   Review of Systems: A complete ROS was negative except as per HPI.   Physical Exam: Blood pressure (!) 169/90, pulse 79, temperature 98.7 F (37.1 C), temperature source Oral, resp. rate 19, SpO2 100 %. General: Lying in bed in no acute distress HEENT: Normocephalic and atraumatic, PERRLA, EOMI Cardiovascular: Normal rate, regular rhythm Respiratory: Clear to auscultation bilaterally normal work of breathing, normal oxygen saturation on room air Abdominal: Nontender to palpation, soft, no mass appreciated MSK: No lower extremity edema, erythema, or warmth noted bilaterally.  No tenderness to palpation of either ankle bilaterally. Neuro: Speech is slow but not slurred.  She does have word finding difficulties and takes her several seconds to over a  minute to find the correct words to answer questions. Cranial nerves II through XII intact, normal sensation, 5 out of 5 strength in upper extremities, 3 out of 5 strength in the lower extremities bilaterally, normal finger-to-nose and rapid alternating movements Skin: Warm and dry Psych: Normal behavior, mood, affect  EKG: personally reviewed my interpretation is normal sinus rhythm  MRI head: IMPRESSION: 1. Patchy small volume acute ischemic infarcts involving the posterior left frontal region, left MCA distribution. No associated hemorrhage or mass effect. 2. Underlying chronic left posterior MCA territory infarct involving the left parietal lobe. 3. Moderate chronic microvascular ischemic disease.   Assessment & Plan by Problem: Active Problems:   Acute ischemic  stroke [facility])  Ms. Channell is a 68 y.o f with HTN, T2 DM, prior embolic left MCA infarct, sarcoidosis, and CKD stage III presented with slurred speech, word finding difficulties, and found to have new acute posterior left frontal region infarcts in the left MCA distribution.  Acute left MCA ischemic stroke: Thought to be atheroembolic etiology vs small vessel etiology.  - Appreciate neuro recs - Atorvastatin 80 mg daily - Aspirin 325 mg and Plavix 75 mg daily - Frequent neuro checks - tte pending  - MRA head and neck - Continuous cardiac monitoring - Pending a1c and lipid panel  - loop recorder in place, will need to interogate - PT/OT/ST eval - Passed bedside swallow eval. Diet ordered.  HTN:  Does not need to have permissive HTN as symptoms have been ongoing for 2 weeks. Goal  <140/90. - Continue home HCTZ 25 mg daily - Will add Lisinopril 5 mg QD as she has been uncontrolled since admission  T2DM: Last a1c 9.0 in December 2019, uncontrolled. Patient is on glipizide 5mg  bid at home . - held glipizide and put on ssi  - may consider starting invokana outpatient   Pyuria: UA showing rare bacteria, small  leukocytes, but with presence of non squamous epithelia. States she has been having burning with urination once. Likely due to contamination. Will hold off on abx for now.  -urine culture   CKD 3: Likely medico-renal disease secondary to dm and htn. Is being followed by [facility]. Microalb/cr ratio 63 in dec 2019 -check phosphorus, calcium 9.3  FEN/GI: Carb modified DVT PPX: heparin CODE STATUS: Full  Dispo: Admit patient to Inpatient with expected length of stay greater than 2 midnights.  Signed: Carroll Sage, MD 10/20/2018, 9:25 PM  Pager: [phone]

## 2018-10-20 NOTE — ED Notes (Signed)
ED TO INPATIENT HANDOFF REPORT  ED Nurse Name and Phone #:  [phone]  S Name/Age/Gender Wendy [name] 68 y.o. female Room/Bed: 015C/015C  Code Status   Code Status: Prior  Home/SNF/Other Home Patient oriented to: self, place, time and situation Is this baseline? Yes   Triage Complete: Triage complete  Chief Complaint slurred speech  Triage Note Pt reports aphasia for the past 2 weeks, hx of prior stroke. No other deficits noted. No severe aphasia noted at this time, pt just slow to get out responses. Pt a.o, nad noted   Allergies Allergies  Allergen Reactions  . Metformin And Related Nausea Only    "severe itching"    Level of Care/Admitting Diagnosis ED Disposition    ED Disposition Condition Comment   Admit  The patient appears reasonably stabilized for admission considering the current resources, flow, and capabilities available in the ED at this time, and I doubt any other [facility] requiring further screening and/or treatment in the ED prior to admission is  present.       B Medical/Surgery History Past Medical History:  Diagnosis Date  . Diabetes mellitus   . Hypertension   . Sarcoidosis   . Stroke [facility])    Past Surgical History:  Procedure Laterality Date  . EP IMPLANTABLE DEVICE N/A 09/29/2015   Procedure: Loop Recorder Insertion;  Surgeon: Evans Lance, MD;  Location: [facility];  Service: Cardiovascular;  Laterality: N/A;  . INTRAOCULAR LENS INSERTION    . TEE WITHOUT CARDIOVERSION N/A 09/29/2015   Procedure: TRANSESOPHAGEAL ECHOCARDIOGRAM (TEE);  Surgeon: Larey Dresser, MD;  Location: [facility];  Service: Cardiovascular;  Laterality: N/A;     A IV Location/Drains/Wounds Patient Lines/Drains/Airways Status   Active Line/Drains/Airways    Name:   Placement date:   Placement time:   Site:   Days:   Peripheral IV 10/20/18 Right Antecubital   10/20/18    1731    Antecubital   less than 1          Intake/Output Last 24 hours No  intake or output data in the 24 hours ending 10/20/18 1955  Labs/Imaging Results for orders placed or performed during the hospital encounter of 10/20/18 (from the past 48 hour(s))  CBG monitoring, ED     Status: Abnormal   Collection Time: 10/20/18  5:19 PM  Result Value Ref Range   Glucose-Capillary 193 (H) 70 - 99 mg/dL  Ethanol     Status: None   Collection Time: 10/20/18  5:31 PM  Result Value Ref Range   Alcohol, Ethyl (B) <10 <10 mg/dL    Comment: (NOTE) Lowest detectable limit for serum alcohol is 10 mg/dL. For medical purposes only. Performed at [facility], [place] [address] [place], [place] [address]   Protime-INR     Status: None   Collection Time: 10/20/18  5:31 PM  Result Value Ref Range   Prothrombin Time 13.7 11.4 - 15.2 seconds   INR 1.1 0.8 - 1.2    Comment: (NOTE) INR goal varies based on device and disease states. Performed at [facility], [place] [address] [place], [place] [address]   APTT     Status: None   Collection Time: 10/20/18  5:31 PM  Result Value Ref Range   aPTT 31 24 - 36 seconds    Comment: Performed at [facility] [address], [place], [place] [address]  CBC     Status: None   Collection  Time: 10/20/18  5:31 PM  Result Value Ref Range   WBC 6.6 4.0 - 10.5 K/uL   RBC 4.80 3.87 - 5.11 MIL/uL   Hemoglobin 12.6 12.0 - 15.0 g/dL   HCT 39.7 36.0 - 46.0 %   MCV 82.7 80.0 - 100.0 fL   MCH 26.3 26.0 - 34.0 pg   MCHC 31.7 30.0 - 36.0 g/dL   RDW 14.1 11.5 - 15.5 %   Platelets 219 150 - 400 K/uL   nRBC 0.0 0.0 - 0.2 %    Comment: Performed at [facility], [place] [address], [place], [place] [address]  Differential     Status: None   Collection Time: 10/20/18  5:31 PM  Result Value Ref Range   Neutrophils Relative % 63 %   Neutro Abs 4.1 1.7 - 7.7 K/uL   Lymphocytes Relative 28 %   Lymphs Abs 1.9 0.7 - 4.0 K/uL   Monocytes Relative 7 %   Monocytes Absolute 0.5 0.1 - 1.0 K/uL   Eosinophils Relative 1 %    Eosinophils Absolute 0.1 0.0 - 0.5 K/uL   Basophils Relative 1 %   Basophils Absolute 0.0 0.0 - 0.1 K/uL   Immature Granulocytes 0 %   Abs Immature Granulocytes 0.02 0.00 - 0.07 K/uL    Comment: Performed at [facility] [address], [place], [place] [address]  Comprehensive metabolic panel     Status: Abnormal   Collection Time: 10/20/18  5:31 PM  Result Value Ref Range   Sodium 137 135 - 145 mmol/L   Potassium 3.7 3.5 - 5.1 mmol/L   Chloride 103 98 - 111 mmol/L   CO2 23 22 - 32 mmol/L   Glucose, Bld 202 (H) 70 - 99 mg/dL   BUN 31 (H) 8 - 23 mg/dL   Creatinine, Ser 1.78 (H) 0.44 - 1.00 mg/dL   Calcium 9.3 8.9 - 10.3 mg/dL   Total Protein 7.6 6.5 - 8.1 g/dL   Albumin 3.4 (L) 3.5 - 5.0 g/dL   AST 16 15 - 41 U/L   ALT 11 0 - 44 U/L   Alkaline Phosphatase 76 38 - 126 U/L   Total Bilirubin 0.6 0.3 - 1.2 mg/dL   GFR calc non Af Amer 29 (L) >60 mL/min   GFR calc Af Amer 33 (L) >60 mL/min   Anion gap 11 5 - 15    Comment: Performed at [facility] [address] [place], [place] [address]  I-stat Creatinine, ED     Status: Abnormal   Collection Time: 10/20/18  5:53 PM  Result Value Ref Range   Creatinine, Ser 1.70 (H) 0.44 - 1.00 mg/dL  Urinalysis, Routine w reflex microscopic     Status: Abnormal   Collection Time: 10/20/18  7:20 PM  Result Value Ref Range   Color, Urine YELLOW YELLOW   APPearance CLEAR CLEAR   Specific Gravity, Urine 1.012 1.005 - 1.030   pH 5.0 5.0 - 8.0   Glucose, UA NEGATIVE NEGATIVE mg/dL   Hgb urine dipstick SMALL (A) NEGATIVE   Bilirubin Urine NEGATIVE NEGATIVE   Ketones, ur NEGATIVE NEGATIVE mg/dL   Protein, ur NEGATIVE NEGATIVE mg/dL   Nitrite NEGATIVE NEGATIVE   Leukocytes,Ua SMALL (A) NEGATIVE   RBC / HPF 0-5 0 - 5 RBC/hpf   WBC, UA 0-5 0 - 5 WBC/hpf   Bacteria, UA RARE (A) NONE SEEN   Squamous Epithelial / LPF 0-5 0 - 5   Hyaline Casts,  UA PRESENT    Non Squamous Epithelial 0-5 (A) NONE SEEN    Comment: Performed at [facility], [place] [address], [place], [place] [address]   Mr Brain 58 Contrast  Result Date: 10/20/2018 CLINICAL DATA:  Initial evaluation for acute aphasia for past 2 weeks. History of prior stroke. EXAM: MRI HEAD WITHOUT CONTRAST TECHNIQUE: Multiplanar, multiecho pulse sequences of the brain and surrounding structures were obtained without intravenous contrast. COMPARISON:  Prior MRI from 09/27/2015 FINDINGS: Brain: Generalized age-related cerebral atrophy. Patchy and confluent T2/FLAIR hyperintensity within the periventricular deep white matter both cerebral hemispheres most consistent with chronic small vessel ischemic disease, moderate nature. Encephalomalacia with gliosis within the left parietal lobe compatible with remote left MCA territory infarct. Patchy small volume foci of restricted diffusion seen involving the cortical and subcortical aspect of the posterior left frontoparietal region, compatible with acute left MCA territory infarct. Largest area of involvement seen at the posterior left centrum semi ovale and measures 16 mm in length (series 5, image 87). No associated hemorrhage or mass effect. No other evidence for acute or subacute ischemia. Gray-white matter differentiation otherwise maintained. No evidence for acute intracranial hemorrhage. Single chronic microhemorrhage noted within the left periatrial white matter, likely small vessel related. No mass lesion, midline shift or mass effect. No hydrocephalus. No extra-axial fluid collection. Pituitary gland suprasellar region normal. Midline structures intact. Vascular: Distal left MCA branches somewhat attenuated as compared to the right. Major intracranial vascular flow voids otherwise maintained. Skull and upper cervical spine: Craniocervical junction within normal limits. Scattered multilevel degenerative spondylolysis noted within the upper cervical spine without significant stenosis. Bone marrow signal intensity diffusely decreased on  T1 weighted imaging, most commonly related to anemia, smoking, or obesity. No focal marrow replacing lesion. No scalp soft tissue abnormality. Sinuses/Orbits: Globes and orbital soft tissues within normal limits. Paranasal sinuses are clear. No significant mastoid effusion. Inner ear structures normal. Other: None. IMPRESSION: 1. Patchy small volume acute ischemic infarcts involving the posterior left frontal region, left MCA distribution. No associated hemorrhage or mass effect. 2. Underlying chronic left posterior MCA territory infarct involving the left parietal lobe. 3. Moderate chronic microvascular ischemic disease. Electronically Signed   By: Jeannine Boga M.D.   On: 10/20/2018 19:31    Pending Labs Unresulted Labs (From admission, onward)    Start     Ordered   10/20/18 1915  HIV antibody (Routine Testing)  Once,   R     10/20/18 1914   10/20/18 1914  SARS Coronavirus 2 [facility] order, Performed in [facility] hospital lab)  (Novel Coronavirus, [facility] Order))  Once,   R     10/20/18 1914   10/20/18 1718  Urine rapid drug screen (hosp performed)  ONCE - STAT,   STAT     10/20/18 1718          Vitals/Pain Today's Vitals   10/20/18 1724 10/20/18 1730 10/20/18 1800 10/20/18 1952  BP: (!) 169/95 (!) 186/80 (!) 173/70   Pulse: 89 84 79   Resp: (!) 21 (!) 21 20   Temp: 98.7 F (37.1 C)     TempSrc: Oral     SpO2: 99% 99% 99%   PainSc:    8     Isolation Precautions No active isolations  Medications Medications  LORazepam (ATIVAN) injection 0.5 mg (0.5 mg Intravenous Given 10/20/18 1813)    Mobility manual wheelchair Moderate fall risk   Focused Assessments Cardiac Assessment Handoff:  Cardiac Rhythm: Normal  sinus rhythm No results found for: CKTOTAL, CKMB, CKMBINDEX, TROPONINI No results found for: DDIMER Does the Patient currently have chest pain? No   , Neuro Assessment Handoff:  Swallow screen pass? Yes  Cardiac Rhythm: Normal sinus rhythm NIH  Stroke Scale ( + Modified Stroke Scale Criteria)  Interval: Initial Level of Consciousness (1a.)   : Alert, keenly responsive LOC Questions (1b. )   +: Answers both questions correctly LOC Commands (1c. )   + : Performs both tasks correctly Best Gaze (2. )  +: Normal Visual (3. )  +: No visual loss Facial Palsy (4. )    : Normal symmetrical movements Motor Arm, Left (5a. )   +: No drift Motor Arm, Right (5b. )   +: No drift Motor Leg, Left (6a. )   +: No drift Motor Leg, Right (6b. )   +: No drift Limb Ataxia (7. ): Absent Sensory (8. )   +: Normal, no sensory loss Best Language (9. )   +: Mild-to-moderate aphasia Dysarthria (10. ): Normal Extinction/Inattention (11.)   +: No Abnormality Modified SS Total  +: 1 Complete NIHSS TOTAL: 1     Neuro Assessment: Within Defined Limits Neuro Checks:   Initial (10/20/18 1724)  Last Documented NIHSS Modified Score: 1 (10/20/18 1930) Has TPA been given? No If patient is a Neuro Trauma and patient is going to OR before floor call report to [place] nurse: [phone] or [phone]     R Recommendations: See Admitting Provider Note  Report given to:   Additional Notes:  Lives at home with husband; both pt and husband have ambulatory issues

## 2018-10-20 NOTE — Consult Note (Addendum)
Neurology Consultation  Reason for Consult: Stroke Referring Physician: Dr. Sherry Ruffing  CC: Speech problems  History is obtained from: Patient, chart  HPI: Wendy [name] is a 68 y.o. female diabetes, hypertension, prior left cerebral hemispheric stroke, baseline right leg weakness and left leg weakness from arthritis, history of sarcoidosis, presented for evaluation of 2 weeks worth of slurred speech. The patient reports that about 12 to 14 days ago she started noticing her speech was not right.  She did not want to come to the hospital because of the ongoing COVID-19 pandemic but the symptoms were not improving and that made her come to the emergency room.  Denies any new tingling numbness or weakness anywhere.  Denies headaches.  Denies any visual symptoms. Denies preceding illness sickness fevers chills shortness of breath cough nausea vomiting. MRI done in the emergency room revealed subacute strokes for which she is being admitted for further work-up.  Of note, prior work-up in 1610 showed a embolic left MCA branch infarct due to left distal M1 cerebral artery occlusion.  She has a loop recorder in place but no atrial fibrillation detected.  Loop recorder was placed somewhere 3 to 4 years ago.  Her TEE showed a questionable PFO but no clot or definitive cardiac source for embolism.  LKW: 2 weeks ago tpa given?: no, outside the window Premorbid modified Rankin scale (mRS): 2  ROS: ROS was performed and is negative except as noted in the HPI.  Past Medical History:  Diagnosis Date  . Diabetes mellitus   . Hypertension   . Sarcoidosis   . Stroke [facility])      Family History  Problem Relation Age of Onset  . Hypertension Son    Social History:   reports that she quit smoking about 42 years ago. She has never used smokeless tobacco. She reports that she does not drink alcohol or use drugs.  Medications  Current Facility-Administered Medications:  .  methylPREDNISolone acetate  (DEPO-MEDROL) injection 40 mg, 40 mg, Intra-articular, Once, Hilts, Michael, MD  Current Outpatient Medications:  .  glipiZIDE (GLUCOTROL) 5 MG tablet, Take 1 tablet (5 mg total) by mouth 2 (two) times daily before a meal., Disp: 180 tablet, Rfl: 1 .  hydrochlorothiazide (HYDRODIURIL) 25 MG tablet, Take 1 tablet (25 mg total) by mouth daily., Disp: 90 tablet, Rfl: 1 .  acetaminophen (TYLENOL 8 HOUR) 650 MG CR tablet, Take 1 tablet (650 mg total) by mouth every 8 (eight) hours as needed for pain., Disp: 90 tablet, Rfl: 2 .  aspirin EC 81 MG tablet, Take 1 tablet (81 mg total) by mouth daily., Disp: 150 tablet, Rfl: 2 .  Blood Glucose Monitoring Suppl (ACCU-CHEK GUIDE) w/Device KIT, 1 each by Does not apply route 2 (two) times daily., Disp: 1 kit, Rfl: 1 .  diclofenac sodium (VOLTAREN) 1 % GEL, Apply 2 g topically 4 (four) times daily., Disp: 1 Tube, Rfl: 1 .  glucose blood (ONETOUCH VERIO) test strip, Check blood sugar two times a day, Disp: 200 each, Rfl: 12 .  Lancet Device MISC, For True Track meter - use to check blood sugars once a week. Dx code: 250.00., Disp: 100 each, Rfl: 11 .  naproxen (NAPROSYN) 500 MG tablet, Take 1 tablet (500 mg total) by mouth 2 (two) times daily with a meal., Disp: 12 tablet, Rfl: 0 .  Nutritional Supplements (NUTRITIONAL SUPPLEMENT PO), Take 1 tablet by mouth daily. High Blood Sugar., Disp: , Rfl:  .  OneTouch Delica Lancets 96E MISC,  Check blood sugar two times a day, Disp: 200 each, Rfl: 3  Exam: Current vital signs: BP (!) 179/88   Pulse 87   Temp 98.7 F (37.1 C) (Oral)   Resp 15   SpO2 97%  Vital signs in last 24 hours: Temp:  [98.3 F (36.8 C)-98.7 F (37.1 C)] 98.7 F (37.1 C) (05/08 1724) Pulse Rate:  [79-89] 87 (05/08 1935) Resp:  [15-21] 15 (05/08 1935) BP: (169-186)/(70-95) 179/88 (05/08 1935) SpO2:  [97 %-99 %] 97 % (05/08 1935) General: Awake alert in no distress HEENT: Normocephalic atraumatic dry mucous membranes Lungs: Clear to  auscultation Cardiovascular: S1-S2 heard regular rate rhythm Abdomen: Soft nondistended nontender Extremities warm well perfused with trace edema Neurological exam Patient is awake alert oriented x3 Her speech is mildly dysarthric. She has no frank naming, comprehension or repetition issues but she has mild decreased fluency and at times has word substitutions and takes pauses to think for the right word. Cranial nerves: Pupils equal round react light, extraocular movements intact, visual fields full, face symmetric, auditory acuity intact, palate elevates midline, shoulder shrug intact, tongue midline. Motor exam upper extremities are both 5/5 with no vertical drift.  Right lower extremity proximally is 3/5 and distally is 4+/5 which she says is her baseline.  Left lower extremity also proximally is 4+/5 and distally is 3-4/5-attributed to pain from arthritis. Sensory exam: Intact light touch all over Coordination: Intact finger-nose-finger NIH stroke scale 1a Level of Conscious.: 0 1b LOC Questions: 0 1c LOC Commands: 0 2 Best Gaze: 0 3 Visual: 0 4 Facial Palsy: 0 5a Motor Arm - left: 0 5b Motor Arm - Right: 0 6a Motor Leg - Left: 1 6b Motor Leg - Right: 2 7 Limb Ataxia: 0 8 Sensory: 0 9 Best Language: 1 10 Dysarthria: 1 11 Extinct. and Inatten.: 0 TOTAL: 5    Labs I have reviewed labs in epic and the results pertinent to this consultation are:  CBC    Component Value Date/Time   WBC 6.6 10/20/2018 1731   RBC 4.80 10/20/2018 1731   HGB 12.6 10/20/2018 1731   HCT 39.7 10/20/2018 1731   PLT 219 10/20/2018 1731   MCV 82.7 10/20/2018 1731   MCH 26.3 10/20/2018 1731   MCHC 31.7 10/20/2018 1731   RDW 14.1 10/20/2018 1731   LYMPHSABS 1.9 10/20/2018 1731   MONOABS 0.5 10/20/2018 1731   EOSABS 0.1 10/20/2018 1731   BASOSABS 0.0 10/20/2018 1731    CMP     Component Value Date/Time   NA 137 10/20/2018 1731   NA 138 07/25/2017 1650   K 3.7 10/20/2018 1731   CL 103  10/20/2018 1731   CO2 23 10/20/2018 1731   GLUCOSE 202 (H) 10/20/2018 1731   BUN 31 (H) 10/20/2018 1731   BUN 23 07/25/2017 1650   CREATININE 1.70 (H) 10/20/2018 1753   CREATININE 1.67 (H) 10/14/2014 1649   CALCIUM 9.3 10/20/2018 1731   PROT 7.6 10/20/2018 1731   ALBUMIN 3.4 (L) 10/20/2018 1731   AST 16 10/20/2018 1731   ALT 11 10/20/2018 1731   ALKPHOS 76 10/20/2018 1731   BILITOT 0.6 10/20/2018 1731   GFRNONAA 29 (L) 10/20/2018 1731   GFRNONAA 32 (L) 10/14/2014 1649   GFRAA 33 (L) 10/20/2018 1731   GFRAA 37 (L) 10/14/2014 1649   Imaging I have reviewed the images obtained: CT-scan of the brain was not done today. MRI examination of the brain patchy small volume acute infarcts in the left MCA  distribution predominantly in the posterior left frontal territory.  No mass-effect or hemorrhage.  Underlying chronic left posterior MCA territory infarct involving the left parietal lobe.  Moderate chronic microvascular disease.  Assessment: 69 year old woman past history of diabetes hypertension sarcoidosis prior left cerebral hemispheric stroke with baseline right leg weakness and some left leg weakness from arthritis presenting for evaluation of 2 weeks worth of slurred speech. She has some expressive aphasia and dysarthria on exam along with some right leg weakness and left leg weakness as well. MRI shows left posterior MCA territory infarcts likely atheroembolic. She has received extensive cardiac work-up and that has been negative thus far for a cardioembolic source.  She has a loop recorder and would like to get that removed.  I had a discussion with her that the loop recorder probably is not working after 3 years and nothing needs to be done on that. I suspect that the fact that she is not on any antiplatelets at this time as well as uncontrolled risk factors are probably contributing to advanced arteriosclerosis causing this stroke.  Impression: Acute ischemic stroke-atheroembolic from  left MCA stenosis versus small vessel etiology from multiple other risk factors  Recommendations: Admit to hospitalist Maintain on telemetry Frequent neurochecks For antiplatelets, I would recommend aspirin 325 and Plavix 75 daily. Since her symptoms are now 3 weeks old, I do not think loading with antiplatelets is necessary.  She can just be started on dual antiplatelets. MRA head without contrast.  MRA neck without contrast.  Ideally would like to get a CTA head and neck but her renal function is impaired and that would not be possible. Atorvastatin 80 mg Check 2D echocardiogram Check with cardiology on the status of the loop recorder Hemoglobin A1c Lipid panel PT OT Speech therapy N.p.o. until cleared by speech evaluation No role for permissive hypertension as symptoms of now been ongoing for 2 weeks.  Goal before discharge should be blood pressures below 140/90 Stroke team will follow with you  -- Amie Portland, MD [facility] Pager: [phone] If 7pm to 7am, please call on call as listed on AMION.

## 2018-10-20 NOTE — ED Triage Notes (Signed)
Pt reports aphasia for the past 2 weeks, hx of prior stroke. No other deficits noted. No severe aphasia noted at this time, pt just slow to get out responses. Pt a.o, nad noted

## 2018-10-21 ENCOUNTER — Inpatient Hospital Stay (HOSPITAL_COMMUNITY): Payer: Medicare Other

## 2018-10-21 ENCOUNTER — Encounter (HOSPITAL_COMMUNITY): Payer: Medicare Other

## 2018-10-21 DIAGNOSIS — N183 Chronic kidney disease, stage 3 (moderate): Secondary | ICD-10-CM

## 2018-10-21 DIAGNOSIS — Z8673 Personal history of transient ischemic attack (TIA), and cerebral infarction without residual deficits: Secondary | ICD-10-CM

## 2018-10-21 DIAGNOSIS — E785 Hyperlipidemia, unspecified: Secondary | ICD-10-CM

## 2018-10-21 DIAGNOSIS — Z79899 Other long term (current) drug therapy: Secondary | ICD-10-CM

## 2018-10-21 DIAGNOSIS — Z888 Allergy status to other drugs, medicaments and biological substances status: Secondary | ICD-10-CM

## 2018-10-21 DIAGNOSIS — I63512 Cerebral infarction due to unspecified occlusion or stenosis of left middle cerebral artery: Principal | ICD-10-CM

## 2018-10-21 DIAGNOSIS — I639 Cerebral infarction, unspecified: Secondary | ICD-10-CM

## 2018-10-21 DIAGNOSIS — Z9114 Patient's other noncompliance with medication regimen: Secondary | ICD-10-CM

## 2018-10-21 DIAGNOSIS — Z87891 Personal history of nicotine dependence: Secondary | ICD-10-CM

## 2018-10-21 DIAGNOSIS — R8281 Pyuria: Secondary | ICD-10-CM

## 2018-10-21 DIAGNOSIS — M1712 Unilateral primary osteoarthritis, left knee: Secondary | ICD-10-CM

## 2018-10-21 DIAGNOSIS — Z7984 Long term (current) use of oral hypoglycemic drugs: Secondary | ICD-10-CM

## 2018-10-21 DIAGNOSIS — D869 Sarcoidosis, unspecified: Secondary | ICD-10-CM

## 2018-10-21 DIAGNOSIS — I129 Hypertensive chronic kidney disease with stage 1 through stage 4 chronic kidney disease, or unspecified chronic kidney disease: Secondary | ICD-10-CM

## 2018-10-21 DIAGNOSIS — E1122 Type 2 diabetes mellitus with diabetic chronic kidney disease: Secondary | ICD-10-CM

## 2018-10-21 DIAGNOSIS — R4781 Slurred speech: Secondary | ICD-10-CM

## 2018-10-21 LAB — BASIC METABOLIC PANEL
Anion gap: 10 (ref 5–15)
BUN: 29 mg/dL — ABNORMAL HIGH (ref 8–23)
CO2: 26 mmol/L (ref 22–32)
Calcium: 9.2 mg/dL (ref 8.9–10.3)
Chloride: 103 mmol/L (ref 98–111)
Creatinine, Ser: 1.58 mg/dL — ABNORMAL HIGH (ref 0.44–1.00)
GFR calc Af Amer: 39 mL/min — ABNORMAL LOW (ref >60)
GFR calc non Af Amer: 33 mL/min — ABNORMAL LOW (ref >60)
Glucose, Bld: 139 mg/dL — ABNORMAL HIGH (ref 70–99)
Potassium: 3.8 mmol/L (ref 3.5–5.1)
Sodium: 139 mmol/L (ref 135–145)

## 2018-10-21 LAB — CBC
HCT: 35.8 % — ABNORMAL LOW (ref 36.0–46.0)
Hemoglobin: 11.6 g/dL — ABNORMAL LOW (ref 12.0–15.0)
MCH: 26.6 pg (ref 26.0–34.0)
MCHC: 32.4 g/dL (ref 30.0–36.0)
MCV: 82.1 fL (ref 80.0–100.0)
Platelets: 203 10*3/uL (ref 150–400)
RBC: 4.36 MIL/uL (ref 3.87–5.11)
RDW: 14.1 % (ref 11.5–15.5)
WBC: 6 10*3/uL (ref 4.0–10.5)
nRBC: 0 % (ref 0.0–0.2)

## 2018-10-21 LAB — GLUCOSE, CAPILLARY
Glucose-Capillary: 197 mg/dL — ABNORMAL HIGH (ref 70–99)
Glucose-Capillary: 241 mg/dL — ABNORMAL HIGH (ref 70–99)

## 2018-10-21 LAB — LIPID PANEL
Cholesterol: 209 mg/dL — ABNORMAL HIGH (ref 0–200)
HDL: 61 mg/dL (ref >40)
LDL Cholesterol: 133 mg/dL — ABNORMAL HIGH (ref 0–99)
Total CHOL/HDL Ratio: 3.4 RATIO
Triglycerides: 76 mg/dL (ref <150)
VLDL: 15 mg/dL (ref 0–40)

## 2018-10-21 LAB — ECHOCARDIOGRAM COMPLETE

## 2018-10-21 LAB — HEMOGLOBIN A1C
Hgb A1c MFr Bld: 9.2 % — ABNORMAL HIGH (ref 4.8–5.6)
Mean Plasma Glucose: 217.34 mg/dL

## 2018-10-21 LAB — HIV ANTIBODY (ROUTINE TESTING W REFLEX): HIV Screen 4th Generation wRfx: NONREACTIVE

## 2018-10-21 MED ORDER — LISINOPRIL 10 MG PO TABS: 10.0000 mg | ORAL_TABLET | Freq: Every day | ORAL | Status: DC

## 2018-10-21 MED ORDER — LORAZEPAM 2 MG/ML IJ SOLN
0.5000 mg | Freq: Once | INTRAMUSCULAR | Status: AC
  Administered 2018-10-21: 0.5 mg via INTRAVENOUS
  Filled 2018-10-21: qty 1

## 2018-10-21 MED ORDER — ROSUVASTATIN CALCIUM 20 MG PO TABS
40.0000 mg | ORAL_TABLET | Freq: Every day | ORAL | Status: DC
  Filled 2018-10-21: qty 2

## 2018-10-21 MED ORDER — CLOPIDOGREL BISULFATE 75 MG PO TABS: 75.0000 mg | ORAL_TABLET | Freq: Every day | ORAL | 0 refills | Status: DC

## 2018-10-21 MED ORDER — INSULIN ASPART 100 UNIT/ML ~~LOC~~ SOLN: 0.0000 [IU] | Freq: Three times a day (TID) | SUBCUTANEOUS | Status: DC

## 2018-10-21 MED ORDER — INSULIN GLARGINE 100 UNIT/ML ~~LOC~~ SOLN
10.0000 [IU] | Freq: Every day | SUBCUTANEOUS | Status: DC
  Filled 2018-10-21: qty 0.1

## 2018-10-21 MED ORDER — ASPIRIN EC 81 MG PO TBEC: 81.0000 mg | DELAYED_RELEASE_TABLET | Freq: Every day | ORAL | Status: DC

## 2018-10-21 MED ORDER — LISINOPRIL 10 MG PO TABS: 10.0000 mg | ORAL_TABLET | Freq: Every day | ORAL | 0 refills | Status: DC

## 2018-10-21 MED ORDER — INSULIN ASPART 100 UNIT/ML ~~LOC~~ SOLN: 0.0000 [IU] | Freq: Every day | SUBCUTANEOUS | Status: DC

## 2018-10-21 MED ORDER — ROSUVASTATIN CALCIUM 40 MG PO TABS: 40.0000 mg | ORAL_TABLET | Freq: Every day | ORAL | 0 refills | Status: DC

## 2018-10-21 MED ORDER — HYDROCHLOROTHIAZIDE 25 MG PO TABS: 25.0000 mg | ORAL_TABLET | Freq: Every day | ORAL | Status: DC

## 2018-10-21 MED ORDER — HYDROCHLOROTHIAZIDE 25 MG PO TABS: 50.0000 mg | ORAL_TABLET | Freq: Every day | ORAL | Status: DC

## 2018-10-21 NOTE — Progress Notes (Signed)
STROKE TEAM PROGRESS NOTE   HISTORY OF PRESENT ILLNESS (per record) Wendy [name] is a 68 y.o. female diabetes, hypertension, prior left cerebral hemispheric stroke, baseline right leg weakness and left leg weakness from arthritis, history of sarcoidosis, presented for evaluation of 2 weeks worth of slurred speech. The patient reports that about 12 to 14 days ago she started noticing her speech was not right.  She did not want to come to the hospital because of the ongoing COVID-19 pandemic but the symptoms were not improving and that made her come to the emergency room.  Denies any new tingling numbness or weakness anywhere.  Denies headaches.  Denies any visual symptoms. Denies preceding illness sickness fevers chills shortness of breath cough nausea vomiting. MRI done in the emergency room revealed subacute strokes for which she is being admitted for further work-up.  Of note, prior work-up in 3329 showed a embolic left MCA branch infarct due to left distal M1 cerebral artery occlusion.  She has a loop recorder in place but no atrial fibrillation detected.  Loop recorder was placed somewhere 3 to 4 years ago.  Her TEE showed a questionable PFO but no clot or definitive cardiac source for embolism.  LKW: 2 weeks ago tpa given?: no, outside the window Premorbid modified Rankin scale (mRS): 2   SUBJECTIVE (INTERVAL HISTORY) Feels like her dysarthria has improved.  She says she was NOT taking ASA daily before this happened.    OBJECTIVE Vitals:   10/21/18 0200 10/21/18 0400 10/21/18 0600 10/21/18 0742  BP: 135/64 (!) 147/77 (!) 144/82 (!) 158/88  Pulse: 70 68 78 72  Resp: 18 18 18 16   Temp: 98.5 F (36.9 C)  98.2 F (36.8 C) 98.6 F (37 C)  TempSrc: Oral  Oral Oral  SpO2: 100% 99% 99% 100%    CBC:  Recent Labs  Lab 10/20/18 1731 10/21/18 0335  WBC 6.6 6.0  NEUTROABS 4.1  --   HGB 12.6 11.6*  HCT 39.7 35.8*  MCV 82.7 82.1  PLT 219 518    Basic Metabolic Panel:   Recent Labs  Lab 10/20/18 1731 10/20/18 1753 10/20/18 2239 10/21/18 0335  NA 137  --   --  139  K 3.7  --   --  3.8  CL 103  --   --  103  CO2 23  --   --  26  GLUCOSE 202*  --   --  139*  BUN 31*  --   --  29*  CREATININE 1.78* 1.70*  --  1.58*  CALCIUM 9.3  --   --  9.2  PHOS  --   --  2.9  --     Lipid Panel:     Component Value Date/Time   CHOL 209 (H) 10/21/2018 0335   CHOL 227 (H) 05/15/2018 1443   TRIG 76 10/21/2018 0335   HDL 61 10/21/2018 0335   HDL 60 05/15/2018 1443   CHOLHDL 3.4 10/21/2018 0335   VLDL 15 10/21/2018 0335   LDLCALC 133 (H) 10/21/2018 0335   LDLCALC 149 (H) 05/15/2018 1443   HgbA1c:  Lab Results  Component Value Date   HGBA1C 9.2 (H) 10/21/2018   Urine Drug Screen:     Component Value Date/Time   LABOPIA NONE DETECTED 10/20/2018 1920   COCAINSCRNUR NONE DETECTED 10/20/2018 1920   LABBENZ NONE DETECTED 10/20/2018 1920   AMPHETMU NONE DETECTED 10/20/2018 1920   THCU NONE DETECTED 10/20/2018 1920   LABBARB NONE DETECTED 10/20/2018 1920  Alcohol Level     Component Value Date/Time   [facility] <10 10/20/2018 1731    IMAGING  Mr Brain Wo Contrast 10/20/2018 IMPRESSION:  1. Patchy small volume acute ischemic infarcts involving the posterior left frontal region, left MCA distribution. No associated hemorrhage or mass effect.  2. Underlying chronic left posterior MCA territory infarct involving the left parietal lobe.  3. Moderate chronic microvascular ischemic disease.   MRA Head and Neck 10/21/2018 pending   Transthoracic Echocardiogram  00/00/2020 Pending   EKG - SR rate 87 BPM. (See cardiology reading for complete details)    PHYSICAL EXAM Blood pressure (!) 158/88, pulse 72, temperature 98.6 F (37 C), temperature source Oral, resp. rate 16, SpO2 100 %.                  ASSESSMENT/PLAN Wendy [name] is a 68 y.o. female with history of prior stroke, loop recorder implant, DM, Htn and sarcoidosis  presenting with 2 weeks worth of slurred speech. . She did not receive IV t-PA due to late presentation (>4.5 hours from time of onset). Of note, prior work-up in 1191 showed a embolic left MCA branch infarct due to left distal M1 cerebral artery occlusion. She has a loop recorder in place but no atrial fibrillation detected. Her TEE showed a questionable PFO but no clot or definitive cardiac source for embolism.  Stroke:  Patchy small volume acute ischemic infarcts involving the posterior left frontal region, left MCA distribution secondary to atheroembolism from the left MCA stenosis.    Resultant  Dystharia.  CT head - not performed  MRI head -  Patchy small volume acute ischemic infarcts involving the posterior left frontal region, left MCA distribution. Underlying chronic left posterior MCA territory infarct involving the left parietal lobe.   MRA H&N - Left MCA stenosis  2D Echo - pending  LDL - 133  HgbA1c - 9.2  UDS  - negative  VTE prophylaxis - [place] heparin  Diet  - Low carbohydrate and Na  Recommend ASA 81 mg qd + Plavix 75 mg qd for next 3 weeks, then ASA 81 mg qd thereafter.  Hypertension  Still not optimal; I will increase HCTZ to 50 mg qd.  Hyperlipidemia  Lipid lowering medication PTA:  none  LDL 133, goal < 70  Current lipid lowering medication: Lipitor 80 mg daily  Continue statin at discharge  Diabetes  HgbA1c 9.2, goal < 7.0  Uncontrolled- adjust outpatient diabetic medications  Other Stroke Risk Factors  Advanced age  Former cigarette smoker - quit  Hx stroke/TIA   Other Active Problems  Creatinine - 1.78->1.70->1.58   Plan  Loop recorder needs interrogation  Mild anemia  Recommend lower extremity dopplers to rule out DT with question of PFO on previous TEE   Hospital day # 1  Rogue Jury, MS, MD   To contact Stroke Continuity provider, please refer to [email or web address] After hours, contact General Neurology

## 2018-10-21 NOTE — Discharge Summary (Signed)
Name: Wendy [name] MRN: [id number] DOB: 1950/09/10 68 y.o. PCP: Asencion Noble, MD  Date of Admission: 10/20/2018  5:04 PM Date of Discharge: 10/21/2018 Attending Physician: Krystal Clark., DO   Discharge Diagnosis: 1. Acute ischemia CVA of L MCA  2. HTN  3. CKD Stage III 4. T2DM  5. HLD   Discharge Medications: Allergies as of 10/21/2018      Reactions   Metformin And Related Nausea Only   "severe itching"      Medication List    TAKE these medications   Accu-Chek Guide w/Device Kit 1 each by Does not apply route 2 (two) times daily.   aspirin EC 81 MG tablet Take 1 tablet (81 mg total) by mouth daily.   cholecalciferol 25 MCG (1000 UT) tablet Commonly known as:  VITAMIN D3 Take 1,000 Units by mouth daily.   CINNAMON PO Take 1 capsule by mouth daily.   clopidogrel 75 MG tablet Commonly known as:  PLAVIX Take 1 tablet (75 mg total) by mouth daily.   ELDERBERRY PO Take 1 capsule by mouth daily.   glipiZIDE 5 MG tablet Commonly known as:  GLUCOTROL Take 1 tablet (5 mg total) by mouth 2 (two) times daily before a meal.   glucose blood test strip Commonly known as:  OneTouch Verio Check blood sugar two times a day   hydrochlorothiazide 25 MG tablet Commonly known as:  HYDRODIURIL Take 1 tablet (25 mg total) by mouth daily.   Lancet Device Misc For True Track meter - use to check blood sugars once a week. Dx code: 250.00.   lisinopril 10 MG tablet Commonly known as:  ZESTRIL Take 1 tablet (10 mg total) by mouth daily.   NUTRITIONAL SUPPLEMENT PO Take 1 tablet by mouth daily. High Blood Sugar.   OneTouch Delica Lancets 70V Misc Check blood sugar two times a day   OVER THE COUNTER MEDICATION Take 0.5 drops by mouth daily. Silver supplement   OVER THE COUNTER MEDICATION Take 1 capsule by mouth daily. Coconut supplement   rosuvastatin 40 MG tablet Commonly known as:  CRESTOR Take 1 tablet (40 mg total) by mouth daily at 6 PM.        Disposition and follow-up:   Wendy [name] was discharged from [facility] in Stable condition.  At the hospital follow up visit please address:  1.  Please evaluate patient for neurological deficits and assess compliance with medications (ASA, Plavix and BP meds) as several changes were made during this admission. She voiced resistance to taking medications due to concern for their side effects. Please continue educating patient on importance of compliance. Please ensure she has follow up with neurology and she is receiving PT at home.   2.  Labs / imaging needed at time of follow-up: BMP   3.  Pending labs/ test needing follow-up: None   Follow-up Appointments: Follow-up Information    [facility] Follow up.   Why:  Our office will call you to schedule an in-person hospital follow up appointment.  Contact information: [address] Hospital Course by problem list:  Wendy [name] is a 68 yo female with a medical history of uncontrolled HTN, uncontrolled X7LT, prior embolic CVA, CKD Stage III, and sarcoidosis who presented to [facility] with a 2-wee history of slurred speech and word-finding difficulty.   1. Acute ischemic CVA of  L MCA: Patient's MRI showed both an acute, ischemic L frontal stroke and chronic L parietal strokes (report below). She underwent a stroke workup during admission including MRA, TTE, and PT/OT evaluation. She was started on DAPT with ASA and Plavis x 3 weeks, then ASA QD. Neurology also recommended LE doppler US, but patient declined as she wanted to go home. She will follow up with neurology as an outpatient in 4-6 weeks. Of note, Wendy [name] reported non-compliance with her home medications for the past year including aspirin and atorvastatin due to concern for their side effects.  She will need continuous education on the importance of compliance.   2. HTN:  Her home HCTZ was continued and she was started on lisinopril 10 mg QD. Will need a BMP at the time of follow up to assess her potassium and renal function.   3. CKD Stage III: Patient's renal function remains stable during this admission. She was started on lisinopril and will need BMP as stated above.   4. T2DM: Patient's DM is uncontrolled, A1c 9.2. Only on metformin at home, which she may not be taking as stated above. No changes were made to her regimen. Would avoid SGLT2 inhibitor due to CKD 3.   5. HLD: Home atorvastatin was switched to rosuvastatin 40 mg QD.    Discharge Vitals:   BP 127/61 (BP Location: Left Arm)   Pulse 86   Temp 99 F (37.2 C) (Oral)   Resp 16   SpO2 99%   Pertinent Labs, Studies, and Procedures:  CBC Latest Ref Rng & Units 10/21/2018 10/20/2018 09/29/2015  WBC 4.0 - 10.5 K/uL 6.0 6.6 6.2  Hemoglobin 12.0 - 15.0 g/dL 11.6(L) 12.6 11.4(L)  Hematocrit 36.0 - 46.0 % 35.8(L) 39.7 34.9(L)  Platelets 150 - 400 K/uL 203 219 217   CMP Latest Ref Rng & Units 10/21/2018 10/20/2018 10/20/2018  Glucose 70 - 99 mg/dL 139(H) - 202(H)  BUN 8 - 23 mg/dL 29(H) - 31(H)  Creatinine 0.44 - 1.00 mg/dL 1.58(H) 1.70(H) 1.78(H)  Sodium 135 - 145 mmol/L 139 - 137  Potassium 3.5 - 5.1 mmol/L 3.8 - 3.7  Chloride 98 - 111 mmol/L 103 - 103  CO2 22 - 32 mmol/L 26 - 23  Calcium 8.9 - 10.3 mg/dL 9.2 - 9.3  Total Protein 6.5 - 8.1 g/dL - - 7.6  Total Bilirubin 0.3 - 1.2 mg/dL - - 0.6  Alkaline Phos 38 - 126 U/L - - 76  AST 15 - 41 U/L - - 16  ALT 0 - 44 U/L - - 11   MRI brain: IMPRESSION: 1. Patchy small volume acute ischemic infarcts involving the posterior left frontal region, left MCA distribution. No associated hemorrhage or mass effect. 2. Underlying chronic left posterior MCA territory infarct involving the left parietal lobe. 3. Moderate chronic microvascular ischemic disease.  MRA w/o contrast: IMPRESSION: 1. Chronic signal loss at the left MCA bifurcation which may reflect  severe stenosis or occlusion with proximal reconstitution of the superior M2 division. 2. No significant reconstitution of flow in the left M2 inferior division which reflects a change from the 2017 MRA. 3. Moderate right paraclinoid ICA stenosis which appears to have progressed from 2017 although motion artifact may be contributing to this appearance today. 4. Patent carotid and vertebral arteries in the neck without significant stenosis identified within study limitations.   TTE 10/21/2018: IMPRESSIONS  1. The left ventricle has hyperdynamic systolic function, with an ejection fraction of >65%. The cavity size was  normal. There is mild concentric left ventricular hypertrophy. Left ventricular diastolic Doppler parameters are consistent with impaired  relaxation.  2. The right ventricle has normal systolic function. The cavity was normal. There is no increase in right ventricular wall thickness.  3. Left atrial size was mildly dilated.  4. The mitral valve is grossly normal. No evidence of mitral valve stenosis.  5. The tricuspid valve is grossly normal.  6. The aortic valve is tricuspid. No stenosis of the aortic valve.  SUMMARY No source of embolism was identified. Bubble study not performed sec to failed iv access, however a small PFO was described on a previous TEE on 09/29/2015.   Discharge Instructions: Discharge Instructions    Diet - low sodium heart healthy   Complete by:  As directed    Discharge instructions   Complete by:  As directed    Wendy [name],   The symptoms you were experiencing were due to a stroke. To decrease the risk of having another stroke in the future it will be important to take the medications the neurologist recommend. Some of these medicines will help control your high blood pressure and high cholesterol which also put you at high risk for another stroke in the future.   Take aspirin 81 mg (1 tablet) every day   Take Plavix 75 mg (1 tablet) every  day for 3 weeks. This is a new medicine, we sent a prescription to your pharmacy.   Take rosuvastatin 40 mg (1 tablet) every day at night. This is a new medicine, we sent a prescription to your pharmacy.  Continue taking your home hydrochlorothiazide at the same dose, 25 mg (1 tablet) every day.   Start taking lisinopril 10 mg (1 tablet) every day. This is a new medication. We sent a prescription to your pharmacy.   Our office will call you on Monday to schedule a hospital follow up appointment in about 7 days.   Call us if you have any questions or concerns.   - Dr. Frederico Hamman   Face-to-face encounter (required for Medicare/Medicaid patients)   Complete by:  As directed    I Welford Roche certify that this patient is under my care and that I, or a nurse practitioner or physician's assistant working with me, had a face-to-face encounter that meets the physician face-to-face encounter requirements with this patient on 10/21/2018. The encounter with the patient was in whole, or in part for the following medical condition(s) which is the primary reason for home health care (List medical condition): Acute stroke   The encounter with the patient was in whole, or in part, for the following medical condition, which is the primary reason for home health care:  Stroke   I certify that, based on my findings, the following services are medically necessary home health services:  Physical therapy   Reason for Medically Necessary Home Health Services:  Skilled Nursing- Change/Decline in Patient Status   My clinical findings support the need for the above services:  Unsafe ambulation due to balance issues   Further, I certify that my clinical findings support that this patient is homebound due to:  Unable to leave home safely without assistance   Home Health   Complete by:  As directed    To provide the following care/treatments:  PT   Increase activity slowly   Complete by:  As directed        Signed: Welford Roche, MD 10/22/2018, 7:15 AM   Pager: [phone]

## 2018-10-21 NOTE — Progress Notes (Signed)
Pt given discharge instructions. IV and tele removed. No new questions or concerns. Son to transport home. Staff discharged from unit by staff.

## 2018-10-21 NOTE — Progress Notes (Signed)
Day on-call team PN: Paged by RN about patient mentions she mentions she was told to go home today.  I saw Ms. Wendy [name] at bedside. She is alert and oriented, sitting on the chair and when I ask her what happened, she mentions that she is supposed to go home today. She has some difficulty for finding words. It seems to be consistent with her previous neuro exam per sign out informations and notes we have from day team.  I explained her the plan for doing Doppler US per our attending's note, and physical therapy evaluation and anticipate DC tomorrow. She mentions that day team doctors talked to her about that but she does not want to stay and does not think this is necessary as she can do it out patient. She declines and says she is getting ready to go home.  I also read PT note and updated her with their recommendations about SNF or home health. She gets upset and does not want any of those. She starts to take off the cardiac monitoring leads and says I want to leave. She has capacity for the decision based on our evaluation. I asked her to give [place] few minutes to talk to day team and for paper work and provide the prescription. She mentions that she does not like [place] and has talked to day team about that. I updated day team.  I updated day team resident and got another page within few minutes that she wants to go AMA. We saw the patient again. Dr. Frederico Hamman talked to her meanwhile and she will be discharged. We asked the patient to give it 15 minutes to get the paper work done. She agrees.  Linna Hoff, MD IM PGY-1

## 2018-10-21 NOTE — Progress Notes (Signed)
2D Echocardiogram has been performed.  Wendy [name] 10/21/2018, 12:02 PM

## 2018-10-21 NOTE — Progress Notes (Signed)
OT Cancellation Note  Patient Details Name: Wendy [name] MRN: [id number] DOB: Nov 02, 1950   Cancelled Treatment:    Reason Eval/Treat Not Completed: Patient at procedure or test/ unavailable(getting MRI) OT will follow-up with patient as needed later today to attempt eval.  Darryl Nestle) Marsa Aris OTR/L Acute Rehabilitation Services Pager: [phone] Office: [place] 10/21/2018, 9:49 AM

## 2018-10-21 NOTE — Progress Notes (Addendum)
I was notified by the on-call team that Ms. Wendy [name] does not wish to stay in the hospital overnight. She was admitted for an acute CVA yesterday and has not completed her stroke work-up. I called her room to discuss her concerns. She states she feels fine and wants to go home. She lives with her husband and son who are her primary care givers and does not think she needs to go to a SNF or have [place] for further rehabilitation. She is also declining LE dopplers which were ordered to r/o DVT as part of her stroke workup.   Her echocardiogram showed hyperdynamic LV systolic function, mild LVH, nl RV function, and LAD. Bubble study was not performed due to failed IV access. PT recommended HH/SNF and OT recommended no follow up.   I discussed importance of stroke of work-up completion and likelihood of discharge home early tomorrow morning. She once again declined and stated she was going home. I also discussed the new changes in her medications and urge her to be compliant with these to reduce the risk of stroke in the future. She stated she was concerned about the side effects and was going to read about them. I offered referral to [facility] to help with med rec at home and education, but she declined. She also declined HH. I will discharge her home today and send prescriptions for all her new medications to her pharmacy. I sent a message to [facility] to schedule for a hospital follow up appointment on 5/11 or 5/12. I will also reach out to neurology to ensure she has follow up with them in 4-6 weeks.   Wendy Roche, MD  Internal Medicine PGY-2  P [phone]  ADDENDUM: Spoke with Wendy Bussing, NP with the stroke team to ensure patient has outpatient neurology follow up. He recommended placing order for Ambulatory neurology referral for patient to see Dr. Leonie [name] in 6 weeks and the office will contact her for an appointment. Order has been placed. We will ensure she has a follow up appt schedule during her [facility]  follow up visit.   Wendy Roche, MD  Internal Medicine PGY-2  P [phone]

## 2018-10-21 NOTE — Evaluation (Addendum)
Occupational Therapy Evaluation Patient Details Name: Wendy [name] MRN: [id number] DOB: Nov 26, 1950 Today's Date: 10/21/2018    History of Present Illness Wendy [name] is a 68 y.o f with HTN, T2 DM, prior embolic left MCA infarct, sarcoidosis, and CKD stage III presented with slurred speech, word finding difficulties, and found to have new acute posterior left frontal region infarcts in the left MCA distribution.   Clinical Impression   Pt PTA: requiring supervisionA for transfers and mostly w/c mobile due to L knee and R hip pain. Pt required assist for LB ADL. Pt currently performing ADL transfers and ADL functional mobility from bed <-> BSC to recliner with minguardA. Pt performing toilet hygiene with minguardA to minA for clothing management and stability in squat stance. Pt UB ADL set-upA. Pt stands for short periods of time and limited by pain in BLEs. Pt tolerating session well. No focal deficits identified. Pt at functional baseline. OT signing off.        No OT follow up;Supervision - Intermittent    Equipment Recommendations  None recommended by OT    Recommendations for Other Services       Precautions / Restrictions Precautions Precautions: Fall Restrictions Weight Bearing Restrictions: No      Mobility Bed Mobility Overal bed mobility: Needs Assistance Bed Mobility: Supine to Sit     Supine to sit: Supervision     General bed mobility comments: for safety  Transfers Overall transfer level: Needs assistance Equipment used: None Transfers: Sit to/from [employer] Transfers Sit to Stand: Min guard   Squat pivot transfers: Min guard     General transfer comment: pt using BUEs using furniture for stability     Balance                                           ADL either performed or assessed with clinical judgement   ADL Overall ADL's : Needs assistance/impaired Eating/Feeding: Modified independent;Sitting   Grooming:  Supervision/safety;Sitting   Upper Body Bathing: Set up;Sitting   Lower Body Bathing: Minimal assistance;Sitting/lateral leans;Sit to/from stand   Upper Body Dressing : Set up;Sitting   Lower Body Dressing: Minimal assistance;Sitting/lateral leans;Sit to/from stand   Toilet Transfer: Min guard;Squat-pivot;BSC   Toileting- Water quality scientist and Hygiene: Min guard       Functional mobility during ADLs: Min guard;Cueing for safety;Cueing for sequencing(transfers taking a few steps )       Vision Baseline Vision/History: No visual deficits Vision Assessment?: No apparent visual deficits     Perception     Praxis      Pertinent Vitals/Pain Pain Assessment: 0-10 Pain Score: 4  Pain Location: R hip, L knee Pain Descriptors / Indicators: Discomfort Pain Intervention(s): Limited activity within patient's tolerance;Repositioned     Hand Dominance Right   Extremity/Trunk Assessment Upper Extremity Assessment Upper Extremity Assessment: Generalized weakness   Lower Extremity Assessment Lower Extremity Assessment: Defer to PT evaluation;Generalized weakness   Cervical / Trunk Assessment Cervical / Trunk Assessment: Other exceptions Cervical / Trunk Exceptions: large body habitus   Communication Communication Communication: No difficulties   Cognition Arousal/Alertness: Awake/alert Behavior During Therapy: WFL for tasks assessed/performed Overall Cognitive Status: Within Functional Limits for tasks assessed  General Comments  pt feels as though she is nearly baseline functioning    Exercises     Shoulder Instructions      Home Living Family/patient expects to be discharged to:: Private residence Living Arrangements: Spouse/significant other Available Help at Discharge: Family;Available 24 hours/day(son available intermittently) Type of Home: Apartment Home Access: Stairs to enter Entrance Stairs-Number of  Steps: 1   Home Layout: One level     Bathroom Shower/Tub: Teacher, early years/pre: Standard     Home Equipment: Wheelchair - manual;Grab bars - tub/shower;Grab bars - toilet          Prior Functioning/Environment Level of Independence: Needs assistance  Gait / Transfers Assistance Needed: RW or furniture walking to w/c; w/c for mobility  ADL's / Homemaking Assistance Needed: Pt set-upA for UB ADL seated and requires assist for LB AD and transfers            OT Problem List: Decreased strength;Impaired balance (sitting and/or standing)      OT Treatment/Interventions:      OT Goals(Current goals can be found in the care plan section)    OT Frequency:     Barriers to D/C:            Co-evaluation              AM-PAC OT "6 Clicks" Daily Activity     Outcome Measure Help from another person eating meals?: None Help from another person taking care of personal grooming?: None Help from another person toileting, which includes using toliet, bedpan, or urinal?: A Little Help from another person bathing (including washing, rinsing, drying)?: A Little Help from another person to put on and taking off regular upper body clothing?: A Little Help from another person to put on and taking off regular lower body clothing?: A Little 6 Click Score: 20   End of Session Equipment Utilized During Treatment: Gait belt;Rolling walker Nurse Communication: Mobility status  Activity Tolerance: Patient tolerated treatment well Patient left: in chair;with call bell/phone within reach;with chair alarm set  OT Visit Diagnosis: Unsteadiness on feet (R26.81);Muscle weakness (generalized) (M62.81)                Time: 1002-1020 OT Time Calculation (min): 18 min Charges:  OT General Charges $OT Visit: 1 Visit OT Evaluation $OT Eval Moderate Complexity: 1 Mod  Darryl Nestle) Marsa Aris OTR/L Acute Rehabilitation Services Pager: [phone] Office:  [place] 10/21/2018, 12:51 PM

## 2018-10-21 NOTE — H&P (Signed)
Date: 10/20/2018               Patient Name:  Wendy [name] MRN: [id number]  DOB: [birth year] Age / Sex: 68 y.o., female   PCP: Asencion Noble, MD         Medical Service: Internal Medicine Teaching Service         Attending Physician: Dr. Lucious Groves, DO    First Contact: Dr. Donne Hazel Pager: [phone]  Second Contact: Dr. Frederico Hamman Pager: [phone]       After Hours (After 5p/  First Contact Pager: [phone]  weekends / holidays): Second Contact Pager: [phone]   Chief Complaint: Slurred speech and word finding difficulties  History of Present Illness: Ms. Das is a 68 y.o f with essential hypertension, diabetes, prior embolic left mca infarct, ckd stage III, sarcoidosis, osteoarthritis of left knee who presented with slurred speech and word finding difficulties.   Two weeks ago she started noticing slurred speech, trouble finding words, and trouble concentrating. These symptoms remained constant since onset. She did not want to come to the hospital for fear of COVID-19. She did become concerned today as these symptoms were not improving and she came to the ED. Associated symptoms include intermittent hear racing but states that this is a chronic issue. She also reports right ankle pain that she believes is a flare up of her osteoarthritis. She uses a walker to get around and can get up with assistance. She denies nausea, vomiting, abdominal pain, and changes in bowel movements. She reports intermittent dysuria (last episode yesterday) but denies hematuria, chills, and fevers.   She had an embolic left MCA branch infarct in 2017. She reports similar symptoms of of aphasia and word-finding difficulties but does report complete resolution of these symptoms within several days of hospitalization. Loop recorder was placed  3-4 years ago but has not shown any abnormalities per Ms. Shifrin. Her TEE showed a questionable PFO but no clot or definitive cardiac  source for embolism.   In the ED she was found to be hypertensive with systolics into the 923R.  Other vital signs were otherwise unremarkable.  Labs were significant for hematuria, pyuria, rare bacteria.  She had elevated creatinine of 1.78 (baseline appears to vary between 1.6-1.65).  CBC unremarkable.  CBG 193.  MRI head showed acute ischemic infarcts involving the posterior left frontal region, left MCA distribution.  No associated hemorrhage or mass-effect.   Meds:  ActiveMedications     Current Facility-Administered Medications for the 10/20/18 encounter [facility] Encounter)  Medication   methylPREDNISolone acetate (DEPO-MEDROL) injection 40 mg       Current Meds  Medication Sig   cholecalciferol (VITAMIN D3) 25 MCG (1000 UT) tablet Take 1,000 Units by mouth daily.   CINNAMON PO Take 1 capsule by mouth daily.   ELDERBERRY PO Take 1 capsule by mouth daily.   glipiZIDE (GLUCOTROL) 5 MG tablet Take 1 tablet (5 mg total) by mouth 2 (two) times daily before a meal.   hydrochlorothiazide (HYDRODIURIL) 25 MG tablet Take 1 tablet (25 mg total) by mouth daily.   OVER THE COUNTER MEDICATION Take 0.5 drops by mouth daily. Silver supplement   OVER THE COUNTER MEDICATION Take 1 capsule by mouth daily. Coconut supplement       Allergies:      Allergies as of 10/20/2018 - Review Complete 10/20/2018  Allergen Reaction Noted   Metformin and related Nausea Only 07/24/2010       Past Medical  History:  Diagnosis Date   Diabetes mellitus    Hypertension    Sarcoidosis    Stroke [facility])     Family History:   Social History        Socioeconomic History   Marital status: Married    Spouse name: Not on file   Number of children: Not on file   Years of education: Not on file   Highest education level: Not on file  Occupational History   Not on file  Social Needs   Financial resource strain: Not on file   Food insecurity:    Worry: Not on file      Inability: Not on file   Transportation needs:    Medical: Not on file    Non-medical: Not on file  Tobacco Use   Smoking status: Former Smoker    Last attempt to quit: 08/09/1976    Years since quitting: 42.2   Smokeless tobacco: Never Used  Substance and Sexual Activity   Alcohol use: No    Alcohol/week: 0.0 standard drinks   Drug use: No   Sexual activity: Not on file  Lifestyle   Physical activity:    Days per week: Not on file    Minutes per session: Not on file   Stress: Not on file  Relationships   Social connections:    Talks on phone: Not on file    Gets together: Not on file    Attends religious service: Not on file    Active member of club or organization: Not on file    Attends meetings of clubs or organizations: Not on file    Relationship status: Not on file   Intimate partner violence:    Fear of current or ex partner: Not on file    Emotionally abused: Not on file    Physically abused: Not on file    Forced sexual activity: Not on file  Other Topics Concern   Not on file  Social History Narrative   Not on file    Social History: She lives at home with her husband. She is retired and used to work as a job Human resources officer. She is a prior smoker quit 40 yrs ago. Denies current etoh user, no drug use.   Review of Systems: A complete ROS was negative except as per HPI.   Physical Exam: Blood pressure (!) 169/90, pulse 79, temperature 98.7 F (37.1 C), temperature source Oral, resp. rate 19, SpO2 100 %. General: Lying in bed in no acute distress HEENT: Normocephalic and atraumatic, PERRLA, EOMI Cardiovascular: Normal rate, regular rhythm Respiratory: Clear to auscultation bilaterally normal work of breathing, normal oxygen saturation on room air Abdominal: Nontender to palpation, soft, no mass appreciated MSK: No lower extremity edema, erythema, or warmth noted bilaterally.  No tenderness to palpation of  either ankle bilaterally. Neuro: Speech is slow but not slurred.  She does have word finding difficulties and takes her several seconds to over a minute to find the correct words to answer questions. Cranial nerves II through XII intact, normal sensation, 5 out of 5 strength in upper extremities, 3 out of 5 strength in the lower extremities bilaterally, normal finger-to-nose and rapid alternating movements Skin: Warm and dry Psych: Normal behavior, mood, affect  EKG: personally reviewed my interpretation is normal sinus rhythm  MRI head: IMPRESSION: 1. Patchy small volume acute ischemic infarcts involving the posterior left frontal region, left MCA distribution. No associated hemorrhage or mass effect. 2. Underlying chronic left posterior MCA  territory infarct involving the left parietal lobe. 3. Moderate chronic microvascular ischemic disease.   Assessment & Plan by Problem: Active Problems:   Acute ischemic stroke [facility])  Ms. Glowacki is a 68 y.o f with HTN, T2 DM, prior embolic left MCA infarct, sarcoidosis, and CKD stage III presented with slurred speech, word finding difficulties, and found to have new acute posterior left frontal region infarcts in the left MCA distribution.  Acute left MCA ischemic stroke: Thought to be atheroembolic etiology vs small vessel etiology.  - Appreciate neuro recs - Atorvastatin 80 mg daily - Aspirin 325 mg and Plavix 75 mg daily - Frequent neuro checks - tte pending  - MRA head and neck - Continuous cardiac monitoring - Pending a1c and lipid panel  - loop recorder in place, will need to interogate - PT/OT/ST eval - Passed bedside swallow eval. Diet ordered.  HTN:  Does not need to have permissive HTN as symptoms have been ongoing for 2 weeks. Goal  <140/90. - Continue home HCTZ 25 mg daily - Will add Lisinopril 5 mg QD as she has been uncontrolled since admission  T2DM: Last a1c 9.0 in December 2019, uncontrolled. Patient is on  glipizide 5mg  bid at home . - held glipizide and put on ssi  - may consider starting invokana outpatient   Pyuria: UA showing rare bacteria, small leukocytes, but with presence of non squamous epithelia. States she has been having burning with urination once. Likely due to contamination. Will hold off on abx for now.  -urine culture   CKD 3: Likely medico-renal disease secondary to dm and htn. Is being followed by [facility]. Microalb/cr ratio 63 in dec 2019 -check phosphorus, calcium 9.3  FEN/GI: Carb modified DVT PPX: heparin CODE STATUS: Full  Dispo: Admit patient to Inpatient with expected length of stay greater than 2 midnights.  Signed: Carroll Sage, MD 10/20/2018, 9:25 PM  Pager: [phone]   Internal Medicine Attending:  Dr Jerlyn Ly note was mistakenly catagorized as progress note instead of H&P in the EMR, I am unable to change this so her note is copy and pasted above.  I saw and examined the patient. I reviewed the residents note and I agree with the residents findings and plan as documented in the residents note.  As noted by Dr. Alfonse Spruce this is a 68 year old female with past medical history of hypertension and diabetes as well as a previous embolic left CVA, CKD stage III, and reported history of sarcoidosis of the lung.  She had a delayed presentation to the hospital for slurred speech and word finding difficulty due to the COVID-19 pandemic.  In the emergency department an MRI of the brain did reveal a new left MCA distribution cerebrovascular accident without hemorrhage.  And she was admitted to our service for further work-up and medical management.  I have repeated and agree with physical exam as documented by Dr. Alfonse Spruce.  Assessment plan Acute CVA left MCA -Stroke work-up in progress she does have some left ICA stenosis.  Previous echo with small PFO she is going to undergo lower extremity Dopplers to rule out DVT. -Otherwise my  conversation with her she is very resistant to medication in general.  She tells me that she has never taking atorvastatin with any regularity this is due to side effects that she has read about online.  I had a long conversation with her about the benefits and risks of statins she is very worried about myalgias.  In this case  I think that it would be more beneficial and building a therapeutic relationship if we changed her to rosuvastatin 40 mg which I discussed may cause less myalgias.  She is agreeable to this. -She also was not taking aspirin for the past year this may have been some confusion with what healthcare provider instructions were.  After much discussion she is agreeable to take aspirin plus Plavix for the next 3 weeks and then aspirin 81 mg alone.   Essential hypertension -Agree is not at goal however I would not increase hydrochlorothiazide to 50 mg especially given her renal dysfunction thiazide may not even be the right choice.  Would return this to 25 mg if better blood pressure control chlorthalidone could be an option.  Would increase lisinopril to 10 mg daily.  Uncontrolled type 2 diabetes -Had long discussion with her about this she is very resistant to medications overall due to potential side effects we discussed the importance of glycemic control as well as a number of the new medications may help prevent vascular disease.  I think we will need close follow-up in the clinic and ongoing discussions before adjustment.  In the interim 10 units of Lantus nightly would be fine.  Hyperlipidemia -Crestor 40mg  daily, repeat lipid panel in 6 weeks goal LDL <70.  Dispo: discharge home anticipated on 5/10 pending PT eval and LE dopplers.

## 2018-10-21 NOTE — Evaluation (Addendum)
Physical Therapy Evaluation Patient Details Name: Wendy [name] MRN: [id number] DOB: 1951-01-22 Today's Date: 10/21/2018   History of Present Illness  Wendy [name] is a 68 y.o f with HTN, T2 DM, prior embolic left MCA infarct, sarcoidosis, and CKD stage III presented with slurred speech, word finding difficulties, and found to have new acute posterior left frontal region infarcts in the left MCA distribution.  Clinical Impression  Patient demonstrated increased aphasia and decreased safety awareness this afternoon. She required assistance for bed mobility and guarding for safety. She would benefit from 24 hour assistance at home as well as home health. If she does not have 24 hour assist she may require rehab at a SNF. Skilled acute therapy will continue to work with patient for transfers and safety training.     Follow Up Recommendations Home health PT;SNF    Equipment Recommendations       Recommendations for Other Services       Precautions / Restrictions Precautions Precautions: Fall Restrictions Weight Bearing Restrictions: No      Mobility  Bed Mobility Overal bed mobility: Needs Assistance Bed Mobility: Sit to Supine     Supine to sit: Supervision Sit to supine: Min assist   General bed mobility comments: for safety, min a to help legfs back into the bed and min a to slide up in the bed.   Transfers Overall transfer level: Needs assistance Equipment used: None Transfers: Sit to/from [employer] Sit to Stand: Min assist   Squat pivot transfers: Min guard     General transfer comment: Min a for safety for transfers. Patient required verbal and tactile cuing gfor safety. Patient was found sitting between the bed and the rail. The chair alram was intact and appeared to be operational. The patient was having difficulty with her aphasia   Ambulation/Gait Ambulation/Gait assistance: Min guard Gait Distance (Feet): 3 Feet Assistive device: Rolling  walker (2 wheeled)   Gait velocity: decreased Gait velocity interpretation: <1.31 ft/sec, indicative of household ambulator General Gait Details: Patient took several steps up the bed but tried to sit without warning 2x. She required cuing to remain standing properly position herself.   Stairs            Wheelchair Mobility    Modified Rankin (Stroke Patients Only) Modified Rankin (Stroke Patients Only) Pre-Morbid Rankin Score: No symptoms     Balance Overall balance assessment: Needs assistance Sitting-balance support: No upper extremity supported Sitting balance-Leahy Scale: Good     Standing balance support: Bilateral upper extremity supported Standing balance-Leahy Scale: Poor                               Pertinent Vitals/Pain Pain Assessment: (could not say 2nd to expressive aphasia this afternoon) Pain Score: 4  Pain Location: R hip, L knee Pain Descriptors / Indicators: Discomfort Pain Intervention(s): Limited activity within patient's tolerance    Home Living Family/patient expects to be discharged to:: Private residence Living Arrangements: Spouse/significant other Available Help at Discharge: Family;Available 24 hours/day Type of Home: Apartment Home Access: Stairs to enter   Entrance Stairs-Number of Steps: 1 Home Layout: One level Home Equipment: Wheelchair - manual;Grab bars - tub/shower;Grab bars - toilet      Prior Function Level of Independence: Needs assistance   Gait / Transfers Assistance Needed: RW or furniture walking to w/c; w/c for mobility   ADL's / Homemaking Assistance Needed: Pt set-upA for UB ADL  seated and requires assist for LB AD and transfers        Hand Dominance   Dominant Hand: Right    Extremity/Trunk Assessment   Upper Extremity Assessment Upper Extremity Assessment: Defer to OT evaluation    Lower Extremity Assessment Lower Extremity Assessment: Generalized weakness    Cervical / Trunk  Assessment Cervical / Trunk Assessment: Other exceptions Cervical / Trunk Exceptions: large body habitus  Communication   Communication: No difficulties  Cognition Arousal/Alertness: Awake/alert Behavior During Therapy: WFL for tasks assessed/performed Overall Cognitive Status: Impaired/Different from baseline Area of Impairment: Safety/judgement;Following commands                       Following Commands: Follows multi-step commands inconsistently;Follows one step commands consistently Safety/Judgement: Decreased awareness of safety;Decreased awareness of deficits     General Comments: significant confusionn and aphasia noted. Per OT this is a change form baseline. Nursing Meredith notified of change in aphasia from OT visit.       General Comments General comments (skin integrity, edema, etc.): pt feels as though she is nearly baseline functioning    Exercises     Assessment/Plan    PT Assessment Patient needs continued PT services  PT Problem List Decreased strength;Decreased activity tolerance;Decreased balance;Decreased mobility;Decreased coordination;Decreased knowledge of use of DME;Decreased safety awareness       PT Treatment Interventions DME instruction;Stair training;Functional mobility training;Therapeutic activities;Therapeutic exercise;Neuromuscular re-education;Balance training;Patient/family education    PT Goals (Current goals can be found in the Care Plan section)  Acute Rehab PT Goals Patient Stated Goal: unabel to assess     Frequency Min 5X/week   Barriers to discharge   Patient will require 24 hour assistance     Co-evaluation               AM-PAC PT "6 Clicks" Mobility  Outcome Measure Help needed turning from your back to your side while in a flat bed without using bedrails?: A Little Help needed moving from lying on your back to sitting on the side of a flat bed without using bedrails?: A Little Help needed moving to and from a  bed to a chair (including a wheelchair)?: A Little Help needed standing up from a chair using your arms (e.g., wheelchair or bedside chair)?: A Lot Help needed to walk in hospital room?: A Lot Help needed climbing 3-5 steps with a railing? : Total 6 Click Score: 14    End of Session Equipment Utilized During Treatment: Gait belt Activity Tolerance: Other (comment)(frequent cuing for safety ) Patient left: in bed;with call bell/phone within reach;with bed alarm set Nurse Communication: Mobility status(notified of increased expressive aphasia ) PT Visit Diagnosis: Unsteadiness on feet (R26.81);Muscle weakness (generalized) (M62.81);Difficulty in walking, not elsewhere classified (R26.2)    Time: 1420-1440 PT Time Calculation (min) (ACUTE ONLY): 20 min   Charges:   PT Evaluation $PT Eval Moderate Complexity: 1 Mod            Carney Living PT DPT  10/21/2018, 4:26 PM

## 2018-10-21 NOTE — Progress Notes (Signed)
   Subjective:  No acute events overnight. Wendy [name] is sleepy and appears confused at to why she is here. She did received Ativan earlier today for MRA. She did not voice any complaints.   Objective:  Vital signs in last 24 hours: Vitals:   10/21/18 0400 10/21/18 0600 10/21/18 0742 10/21/18 1200  BP: (!) 147/77 (!) 144/82 (!) 158/88 (!) 142/79  Pulse: 68 78 72 (!) 55  Resp: 18 18 16 18   Temp:  98.2 F (36.8 C) 98.6 F (37 C) 98.8 F (37.1 C)  TempSrc:  Oral Oral Oral  SpO2: 99% 99% 100% 100%   General: well-developed, obese female in no acute distress CV: RRR, nl S1/S2, no mrg  Neuro: Sleepy. Oriented to self and place. Thought process is slow and she has word-finding difficulty. CN II-XII intact. Sensation intact. Chronic 3/5 bilateral LE weakness.  Ext: warm and well perfused without edema   Assessment/Plan:  Principal Problem:   Acute ischemic stroke (HCC) Active Problems:   Uncontrolled diabetes mellitus with hyperglycemia (HCC)   Essential hypertension, benign   CKD (chronic kidney disease) stage 3, GFR 30-59 ml/min (HCC)  # Acute L MCA CVA: Sleepy and confused after receiving Ativan for MRA. Continues to have word-finding difficulty and slow thought process. On ASA and Plavix. Undergoing stroke work up.  - Continue ASA and Plavix + statin  - Follow up neurology, PT, and OT recommendations  - Follow up echocardiogram   # HTN: Remains hypertensive with sBP 140-150s.  - Continue HCTZ 25 mg QD, no added benefit in BP at maximum dose. It also increases risk of electrolyte abnormalities  - Continue lisinopril at 5 mg QD  - Medications can be further adjusted in the outpatient setting. She follows up with [place] at [facility]   # T2DM: CBG at goal. Will continue SSI and plan to resume home meds. However, A1c 9.2 and will benefit from further adjustments in medications as an outpatient. Would avoid SGLT2 inhibitors given her CKD with GFR in the 30s.    # CKD Stage III: Renal  function remains at baseline. Monitoring.   Dispo: Anticipated discharge in approximately 1 day(s) pending completion of stroke work up.   Welford Roche, MD 10/21/2018, 12:49 PM Pager: [phone]

## 2018-10-22 ENCOUNTER — Other Ambulatory Visit: Payer: Self-pay | Admitting: Internal Medicine

## 2018-10-22 DIAGNOSIS — I639 Cerebral infarction, unspecified: Secondary | ICD-10-CM

## 2018-10-22 LAB — URINE CULTURE: Culture: 30000 — AB

## 2018-10-25 ENCOUNTER — Encounter: Payer: Self-pay | Admitting: Internal Medicine

## 2018-10-25 ENCOUNTER — Telehealth: Payer: Self-pay | Admitting: Pharmacist

## 2018-10-25 ENCOUNTER — Ambulatory Visit (INDEPENDENT_AMBULATORY_CARE_PROVIDER_SITE_OTHER): Payer: Medicare Other | Admitting: Internal Medicine

## 2018-10-25 ENCOUNTER — Other Ambulatory Visit: Payer: Self-pay

## 2018-10-25 VITALS — BP 161/65 | HR 79 | Temp 99.2°F

## 2018-10-25 DIAGNOSIS — Z7984 Long term (current) use of oral hypoglycemic drugs: Secondary | ICD-10-CM

## 2018-10-25 DIAGNOSIS — Z79899 Other long term (current) drug therapy: Secondary | ICD-10-CM | POA: Diagnosis not present

## 2018-10-25 DIAGNOSIS — Z7902 Long term (current) use of antithrombotics/antiplatelets: Secondary | ICD-10-CM

## 2018-10-25 DIAGNOSIS — Z7982 Long term (current) use of aspirin: Secondary | ICD-10-CM

## 2018-10-25 DIAGNOSIS — I10 Essential (primary) hypertension: Secondary | ICD-10-CM | POA: Diagnosis not present

## 2018-10-25 DIAGNOSIS — M25562 Pain in left knee: Secondary | ICD-10-CM | POA: Diagnosis not present

## 2018-10-25 DIAGNOSIS — I639 Cerebral infarction, unspecified: Secondary | ICD-10-CM

## 2018-10-25 DIAGNOSIS — I69328 Other speech and language deficits following cerebral infarction: Secondary | ICD-10-CM | POA: Diagnosis not present

## 2018-10-25 DIAGNOSIS — E118 Type 2 diabetes mellitus with unspecified complications: Secondary | ICD-10-CM | POA: Diagnosis not present

## 2018-10-25 DIAGNOSIS — Z87891 Personal history of nicotine dependence: Secondary | ICD-10-CM | POA: Diagnosis not present

## 2018-10-25 DIAGNOSIS — E1165 Type 2 diabetes mellitus with hyperglycemia: Secondary | ICD-10-CM

## 2018-10-25 MED ORDER — LISINOPRIL 20 MG PO TABS: 20.0000 mg | ORAL_TABLET | Freq: Every day | ORAL | 3 refills | Status: DC

## 2018-10-25 NOTE — Assessment & Plan Note (Signed)
She has uncontrolled diabetes with A1c of 9.2 during recent hospitalization.  She was not taking any medication. She was restarted on glipizide on discharge-Per patient now she is taking it and can see improvement in her blood sugar.  She did not bring any glucometer today.  -Continue glipizide 5 mg twice daily. -Advised her to bring her glucometer during next follow-up appointment.

## 2018-10-25 NOTE — Assessment & Plan Note (Signed)
BP Readings from Last 3 Encounters:  10/25/18 (!) 161/65  10/21/18 127/61  05/25/18 (!) 173/84   Blood pressure was elevated today.  It increased during recheck to 161 from 149. Patient did took her HCTZ this morning and takes her lisinopril at bedtime. She denies any headache change in her vision. She was very reluctant to increase the dose of lisinopril, but daughter wants a better control of blood pressure to prevent further strokes.  -Increase lisinopril from 10 to 20 mg daily. -Check BMP. -Continue HCTZ. -Follow-up in 2 weeks for blood pressure check.

## 2018-10-25 NOTE — Assessment & Plan Note (Signed)
Patient has an acute infarct with an history of prior CVA. She has uncontrolled hypertension and diabetes most likely secondary to medication noncompliance. Her speech is improving and she is having no new deficit.  -She will continue Plavix and aspirin for 3 weeks, then aspirin 81 mg daily. -Continue Crestor 40 mg daily. -Follow up with neurology in 4 to 6 weeks. -Had a long discussion regarding better control of diabetes and hypertension for prevention of further stroke.

## 2018-10-25 NOTE — Progress Notes (Signed)
   CC: For hospital follow-up.  HPI:  Wendy [name] is a 68 y.o. past medical history as listed below came to the clinic for hospital follow-up of her recent hospitalization.  Patient was admitted at [facility] from 5/8-10/21/2018 due to left MCA stroke.  She was admitted due to difficulty speaking and difficulty with finding words going on for 2 weeks.  tPA was not done as she was out of window.  MRI shows acute left frontal region infarct.  It was found that she was not compliant with her antihypertensives due to listed adverse effects.  She was having elevated blood pressure and was restarted on HCTZ, lisinopril 10 mg was added to her regimen.  She was also discharged on DAPT for 3 weeks then she will continue aspirin 81 mg daily.  Her statin was switched to high intensity.  Today she was feeling better.  Her difficulty with speech is improving.  She is not getting any therapy at this time.  She was also complaining of worsening left knee pain-she sees an orthopedic and waiting for an knee replacement.  See assessment and plan for her medical problems.  Past Medical History:  Diagnosis Date  . Diabetes mellitus   . Hypertension   . Sarcoidosis   . Stroke [facility])    Review of Systems: Negative except mentioned in HPI  Physical Exam:  Vitals:   10/25/18 1436 10/25/18 1502  BP: (!) 149/61 (!) 161/65  Pulse: 84 79  Temp: 99.2 F (37.3 C)   TempSrc: Oral   SpO2: 100%    General: Vital signs reviewed.  Patient is well-developed and well-nourished, in no acute distress and cooperative with exam.  Head: Normocephalic and atraumatic. Eyes: EOMI, conjunctivae normal, no scleral icterus.  Cardiovascular: RRR, S1 normal, S2 normal, no murmurs, gallops, or rubs. Pulmonary/Chest: Clear to auscultation bilaterally, no wheezes, rales, or rhonchi. Abdominal: Soft, non-tender, non-distended, BS +,  Extremities: No lower extremity edema bilaterally,  pulses symmetric and intact  bilaterally.  Neurological: A&O x3, Strength is normal and symmetric bilaterally, cranial nerve II-XII are grossly intact, no focal motor deficit, sensory intact to light touch bilaterally.  Skin: Warm, dry and intact. No rashes or erythema. Psychiatric: Normal mood and affect. speech and behavior is normal. Cognition and memory are normal.  Assessment & Plan:   See Encounters Tab for problem based charting.  Patient discussed with Dr. Daryll Drown.

## 2018-10-25 NOTE — Patient Instructions (Addendum)
Thank you for visiting clinic today. As we discussed your blood pressure is elevated, I am increasing the dose of lisinopril from 10 to 20 mg daily. It is very important to have your blood pressure and diabetes under good control to prevent any more strokes. Please follow-up in 2 weeks for your blood pressure check. To bring your glucometer with your next appointment to download so we can see how your blood sugar is doing.

## 2018-10-25 NOTE — Progress Notes (Signed)
Transition Care Management Follow-up Telephone Call  Date discharged? 10/21/2018  How have you been since you were released from the hospital? well  Do you understand why you were in the hospital? yes  Do you understand the discharge instructions? Yes---has questions about her medications  Where were you discharged to? home  Items Reviewed:  Medications reviewed: yes  Allergies reviewed: yes  Dietary changes reviewed: no  Referrals reviewed: yes MEDICATION ASSESSMENT: Date Discharged from Hospital: 10/21/2018 Date Medication Reconciliation Performed: 10/25/2018  New at Discharge: . Clopidogrel, rosuvastatin   Adjustments at Discharge: . Added clopidogrel and atorvastatin   Discontinued at Discharge:   N/a   Patient was recently discharged from hospital and all medications have been reviewed.  Functional Questionnaire:   Activities of Daily Living (ADLs):   She states they are independent in the following: all ADLs with some assistance States they require assistance with the following: all  Any transportation issues/concerns?: no  Any patient concerns? Yes, patient reports not taking aspirin, provided education on DAPT. She verbalized understanding and states she will initiate aspirin. She also reports refusal of statins due to joint pain. Education provided on benefits of statins, she was open to trying to start 1/2 tablet daily rather than not take statin. She will discuss at [facility] appointment.   Notably, patient is taking glipizide for DM therapy, recommend considering SGLT-2i or GLP-1a---will discuss with PCP.   Confirmed importance and date/time of follow-up visits scheduled yes  Provider Appointment booked with [facility] 10/25/2018  Confirmed with patient if condition begins to worsen call PCP or go to the ER.  Patient was given the office number and encouraged to call back with question or concerns.  : yes

## 2018-10-26 LAB — BMP8+ANION GAP
Anion Gap: 14 mmol/L (ref 10.0–18.0)
BUN/Creatinine Ratio: 20 (ref 12–28)
BUN: 33 mg/dL — ABNORMAL HIGH (ref 8–27)
CO2: 22 mmol/L (ref 20–29)
Calcium: 9.4 mg/dL (ref 8.7–10.3)
Chloride: 101 mmol/L (ref 96–106)
Creatinine, Ser: 1.64 mg/dL — ABNORMAL HIGH (ref 0.57–1.00)
GFR calc Af Amer: 37 mL/min/{1.73_m2} — ABNORMAL LOW (ref >59)
GFR calc non Af Amer: 32 mL/min/{1.73_m2} — ABNORMAL LOW (ref >59)
Glucose: 221 mg/dL — ABNORMAL HIGH (ref 65–99)
Potassium: 4.3 mmol/L (ref 3.5–5.2)
Sodium: 137 mmol/L (ref 134–144)

## 2018-10-27 NOTE — Progress Notes (Signed)
Internal Medicine Clinic Attending  Case discussed with Dr. Krienke at the time of the visit.  We reviewed the resident's history and exam and pertinent patient test results.  I agree with the assessment, diagnosis, and plan of care documented in the resident's note.    

## 2018-11-08 ENCOUNTER — Ambulatory Visit (INDEPENDENT_AMBULATORY_CARE_PROVIDER_SITE_OTHER): Payer: Medicare Other | Admitting: Internal Medicine

## 2018-11-08 ENCOUNTER — Other Ambulatory Visit: Payer: Self-pay

## 2018-11-08 DIAGNOSIS — I10 Essential (primary) hypertension: Secondary | ICD-10-CM

## 2018-11-08 DIAGNOSIS — Z79899 Other long term (current) drug therapy: Secondary | ICD-10-CM

## 2018-11-08 DIAGNOSIS — Z7984 Long term (current) use of oral hypoglycemic drugs: Secondary | ICD-10-CM

## 2018-11-08 DIAGNOSIS — M25562 Pain in left knee: Secondary | ICD-10-CM

## 2018-11-08 DIAGNOSIS — E1165 Type 2 diabetes mellitus with hyperglycemia: Secondary | ICD-10-CM

## 2018-11-08 DIAGNOSIS — Z7902 Long term (current) use of antithrombotics/antiplatelets: Secondary | ICD-10-CM

## 2018-11-08 DIAGNOSIS — Z87891 Personal history of nicotine dependence: Secondary | ICD-10-CM | POA: Diagnosis not present

## 2018-11-08 DIAGNOSIS — E118 Type 2 diabetes mellitus with unspecified complications: Secondary | ICD-10-CM | POA: Diagnosis not present

## 2018-11-08 NOTE — Patient Instructions (Signed)
Thank you for visiting clinic today. I am not making any changes to your medication at this time. Please check your blood pressure at home and write it down on a piece of paper, bring that log with you during your next appointment. Continue taking your current medications as directed. Please follow-up with your PCP within next 2 to 4 weeks.

## 2018-11-08 NOTE — Progress Notes (Signed)
   CC: For follow-up of her hypertension.  HPI:  [name] is a 68 y.o. past medical history as listed below came to the clinic for follow-up of her hypertension. Lisinopril was increased from 10-20 during previous clinic visit 2 weeks ago, patient continued to take 10 mg up till 2 days ago, she just took 2 doses of 20 mg so far.  She was having worsening of left knee pain.  Per patient steroid injection does not help and she is waiting for her knee replacement surgery which is been postponed due to COVID-19.  Please see assessment and plan for her chronic conditions.  Past Medical History:  Diagnosis Date  . Diabetes mellitus   . Hypertension   . Sarcoidosis   . Stroke [facility])    Review of Systems: Negative except mentioned in HPI.  Physical Exam:  Vitals:   11/08/18 1520 11/08/18 1529  BP: (!) 152/64 (!) 150/76  Pulse: 81 79  Temp: 98.8 F (37.1 C)   TempSrc: Oral   SpO2: 99%   Weight: 226 lb 9.6 oz (102.8 kg)   Height: 5\' 4"  (1.626 m)    General: Vital signs reviewed.  Patient is well-developed and well-nourished, in no acute distress and cooperative with exam.  Head: Normocephalic and atraumatic. Eyes: EOMI, conjunctivae normal, no scleral icterus.  Cardiovascular: RRR, S1 normal, S2 normal, no murmurs, gallops, or rubs. Pulmonary/Chest: Clear to auscultation bilaterally, no wheezes, rales, or rhonchi. Abdominal: Soft, non-tender, non-distended, BS +, Extremities: No lower extremity edema bilaterally,  pulses symmetric and intact bilaterally. No cyanosis or clubbing. Skin: Warm, dry and intact. No rashes or erythema. Psychiatric: Normal mood and affect. speech and behavior is normal. Cognition and memory are normal.  Assessment & Plan:   See Encounters Tab for problem based charting.  Patient discussed with Dr. Angelia Mould

## 2018-11-08 NOTE — Assessment & Plan Note (Signed)
She has uncontrolled diabetes and recently restarted on glipizide. She does not check her blood sugar at home.  -Encouraged her to check her blood sugar at home regularly and bring glucometer during next follow-up appointment. -Continue current management.

## 2018-11-08 NOTE — Assessment & Plan Note (Signed)
BP Readings from Last 3 Encounters:  11/08/18 (!) 150/76  10/25/18 (!) 161/65  10/21/18 127/61   Her blood pressure remained elevated.  Per patient whenever she checked her blood pressure at home machine tells her that her blood pressure is normal.  She does not remember the reading. She did not started lisinopril 20 mg until recently, too early to see the effect.  -We will continue with current management of lisinopril 20 mg daily and HCTZ of 25 mg daily. -We will reevaluate during next follow-up visit. -We will need a repeat BMP at that time.

## 2018-11-09 ENCOUNTER — Telehealth: Payer: Self-pay | Admitting: Pharmacist

## 2018-11-09 DIAGNOSIS — E1165 Type 2 diabetes mellitus with hyperglycemia: Secondary | ICD-10-CM

## 2018-11-09 MED ORDER — SEMAGLUTIDE 3 MG PO TABS: 1.0000 | ORAL_TABLET | Freq: Every day | ORAL | 0 refills | Status: DC

## 2018-11-09 NOTE — Progress Notes (Signed)
Tried calling patient to switch glipizide to GLP-1a for CV benefit (price check on oral GLP-1a rybelsus $400 but could try to apply for Medicare Extra Help). Consider avoiding SGLT-2 due to eGFR 37. LVM for patient to call back.

## 2018-11-14 NOTE — Progress Notes (Signed)
Internal Medicine Clinic Attending  Case discussed with Dr. Amin at the time of the visit.  We reviewed the resident's history and exam and pertinent patient test results.  I agree with the assessment, diagnosis, and plan of care documented in the resident's note.    

## 2018-11-28 ENCOUNTER — Ambulatory Visit: Payer: Medicare Other | Admitting: Neurology

## 2018-11-28 ENCOUNTER — Ambulatory Visit: Payer: PRIVATE HEALTH INSURANCE | Admitting: Neurology

## 2018-12-01 ENCOUNTER — Encounter: Payer: Self-pay | Admitting: Dietician

## 2018-12-03 NOTE — Progress Notes (Signed)
   CC: Hypertension, recent CVA, diabetes, and right hip and left knee pain  HPI:  [name] is a 68 y.o.  with a PMH listed below presenting for hypertension, recent CVA, diabetes, and right hip and left knee pain.   Please see A&P for status of the patient's chronic medical conditions  Past Medical History:  Diagnosis Date  . Diabetes mellitus   . Hypertension   . Sarcoidosis   . Stroke [facility])    Review of Systems: Refer to history of present illness and assessment and plans for pertinent review of systems, all others reviewed and negative.  Physical Exam:  Vitals:   12/04/18 1503 12/04/18 1608  BP: 140/60 (!) 156/68  Pulse: 82   Temp: 98.2 F (36.8 C)   TempSrc: Oral   SpO2: 99%   Weight: 220 lb 3.2 oz (99.9 kg)   Height: 5\' 4"  (1.626 m)     Physical Exam  Constitutional:  Middle aged female, NAD, in wheelchair  Neck: Normal range of motion. Neck supple. No thyromegaly present.  Cardiovascular: Normal rate, regular rhythm and normal heart sounds.  Pulmonary/Chest: Effort normal and breath sounds normal. No respiratory distress.  Abdominal: Soft. Bowel sounds are normal. She exhibits no distension.  Musculoskeletal:     Comments: Pain with flexion of right hip, and passive and active movement of left knee. No evidence of effusion on exam.   Skin: Skin is warm and dry.    Social History   Socioeconomic History  . Marital status: Married    Spouse name: Not on file  . Number of children: Not on file  . Years of education: Not on file  . Highest education level: Not on file  Occupational History  . Not on file  Social Needs  . Financial resource strain: Not on file  . Food insecurity    Worry: Not on file    Inability: Not on file  . Transportation needs    Medical: Not on file    Non-medical: Not on file  Tobacco Use  . Smoking status: Former Smoker    Quit date: 08/09/1976    Years since quitting: 42.3  . Smokeless tobacco: Never Used   Substance and Sexual Activity  . Alcohol use: No    Alcohol/week: 0.0 standard drinks  . Drug use: No  . Sexual activity: Not on file  Lifestyle  . Physical activity    Days per week: Not on file    Minutes per session: Not on file  . Stress: Not on file  Relationships  . Social Herbalist on phone: Not on file    Gets together: Not on file    Attends religious service: Not on file    Active member of club or organization: Not on file    Attends meetings of clubs or organizations: Not on file    Relationship status: Not on file  . Intimate partner violence    Fear of current or ex partner: Not on file    Emotionally abused: Not on file    Physically abused: Not on file    Forced sexual activity: Not on file  Other Topics Concern  . Not on file  Social History Narrative  . Not on file    Family History  Problem Relation Age of Onset  . Hypertension Son     Assessment & Plan:   See Encounters Tab for problem based charting.  Patient discussed with Dr. Lynnae January

## 2018-12-04 ENCOUNTER — Ambulatory Visit (INDEPENDENT_AMBULATORY_CARE_PROVIDER_SITE_OTHER): Payer: Medicare Other | Admitting: Internal Medicine

## 2018-12-04 ENCOUNTER — Encounter: Payer: Self-pay | Admitting: Internal Medicine

## 2018-12-04 ENCOUNTER — Other Ambulatory Visit: Payer: Self-pay

## 2018-12-04 VITALS — BP 156/68 | HR 82 | Temp 98.2°F | Ht 64.0 in | Wt 220.2 lb

## 2018-12-04 DIAGNOSIS — Z7984 Long term (current) use of oral hypoglycemic drugs: Secondary | ICD-10-CM | POA: Diagnosis not present

## 2018-12-04 DIAGNOSIS — I639 Cerebral infarction, unspecified: Secondary | ICD-10-CM

## 2018-12-04 DIAGNOSIS — N183 Chronic kidney disease, stage 3 unspecified: Secondary | ICD-10-CM

## 2018-12-04 DIAGNOSIS — Z7982 Long term (current) use of aspirin: Secondary | ICD-10-CM | POA: Diagnosis not present

## 2018-12-04 DIAGNOSIS — E118 Type 2 diabetes mellitus with unspecified complications: Secondary | ICD-10-CM

## 2018-12-04 DIAGNOSIS — I10 Essential (primary) hypertension: Secondary | ICD-10-CM

## 2018-12-04 DIAGNOSIS — M25551 Pain in right hip: Secondary | ICD-10-CM

## 2018-12-04 DIAGNOSIS — Z8673 Personal history of transient ischemic attack (TIA), and cerebral infarction without residual deficits: Secondary | ICD-10-CM | POA: Diagnosis not present

## 2018-12-04 DIAGNOSIS — Z87891 Personal history of nicotine dependence: Secondary | ICD-10-CM | POA: Diagnosis not present

## 2018-12-04 DIAGNOSIS — E1165 Type 2 diabetes mellitus with hyperglycemia: Secondary | ICD-10-CM

## 2018-12-04 DIAGNOSIS — M25562 Pain in left knee: Secondary | ICD-10-CM

## 2018-12-04 DIAGNOSIS — Z79899 Other long term (current) drug therapy: Secondary | ICD-10-CM

## 2018-12-04 MED ORDER — JARDIANCE 10 MG PO TABS: 10.0000 mg | ORAL_TABLET | Freq: Every day | ORAL | 0 refills | Status: DC

## 2018-12-04 NOTE — Patient Instructions (Addendum)
Ms. MADELYNN MALSON,  It was a pleasure to see you today. Thank you for coming in.   Today we discussed your diabetes, blood pressure, and recent stroke.   For your diabetes, please stop taking the glipizide.  Please start taking Jardiance 10 mg daily.  Try to check your blood sugars at home, see if you can get any help with checking this.  We also discussed your blood pressure, your blood pressure is a little high today.  Please ask your son to contact us to let us know exactly which medications you are taking at home.  We will likely need to adjust her medications on her next visit.  In regards to your hip pain and knee pain, please continue to follow-up with orthopedics.  Please work on your diet and exercise.  In regards to your recent stroke, please continue to take your aspirin daily.  Follow-up with the neurologist.  Please return to clinic in 2 months or sooner if needed.   Thank you again for coming in.   Asencion Noble.D.

## 2018-12-05 ENCOUNTER — Telehealth: Payer: Self-pay

## 2018-12-05 NOTE — Telephone Encounter (Signed)
Pt states she is not taking amlodipine, she is taking lisinopril 20mg  daily

## 2018-12-05 NOTE — Telephone Encounter (Signed)
Requesting to speak with a nurse about meds. Please call pt back.  

## 2018-12-06 NOTE — Assessment & Plan Note (Addendum)
BP Readings from Last 3 Encounters:  12/04/18 (!) 156/68  11/08/18 (!) 150/76  10/25/18 (!) 161/65   Blood pressure is still elevated today, she is on lisinopril 20 mg daily and HCTZ 25 mg daily.  The patient has continued and she was taking, she is reporting that she was taking amlodipine at home however she states that her son helps her with her medications. She contacted clinic later in the week to tell us that she was taking lisinopril 20 mg daily. Discussed going up on the medication due to her elevated blood pressure. She reported that she did not want to increase her lisinopril at this time.   Plan: -Continue lisinopril 20 mg daily, will further discuss importance of BP control on next visit -Continue HCTZ 25 mg daily  -RTC in 2 months

## 2018-12-07 NOTE — Assessment & Plan Note (Signed)
Patient reports that she still having a lot of right hip pain, she states that she has been following up with the orthopedics and has had injections in that area.  Patient has been with any movement of her right hip.  Patient uses a wheelchair to get around, she is able to walk however has significant pain when she ambulates for long distances.  Patient has follow-up with orthopedics.

## 2018-12-07 NOTE — Assessment & Plan Note (Addendum)
Patient had an acute CVA of L MCA during her hospitalization from 10/20/2018 to 10/21/2018. She had a MRA, TTE, and PT/OT eval while there. It was thought that this was due to her not taking her medications due to her concern for side effects. She has been taking aspirin and Plavix for 3 weeks, she reports that she is now just taking aspirin daily. She denies any issues with her speech at this time. She has a neurology appointment within the next week. On exam she has no residual deficits noted. Discussed the relationship of uncontrolled blood pressure and diabetes and her stroke, discussed the importance of taking her medications.   -Continue aspirin 81 mg daily, discontinue plavix -Crestor 40 mg daily -F/u with neurology

## 2018-12-07 NOTE — Assessment & Plan Note (Addendum)
Patient reports that she is on glipizide 5 mg daily, She had it prescribed as twice daily but patient has been taking it daily.  She reports that she does not check her blood sugars at home. She denies any symptoms of hypo-or hyperglycemia at this time. She  She reported that her daughter in law can help check her sugars. Her last A1c was 9.2 about 1 month ago.  Given the risk of hypoglycemia with glipizide will adjust her diabetic medication.  -Switch glipizide to jardiance 10 mg daily -Advised patient to check her blood sugars at home -RTC in 2 months to repeat A1c

## 2018-12-08 NOTE — Progress Notes (Signed)
Internal Medicine Clinic Attending  Case discussed with Dr. Krienke at the time of the visit.  We reviewed the resident's history and exam and pertinent patient test results.  I agree with the assessment, diagnosis, and plan of care documented in the resident's note.    

## 2018-12-11 ENCOUNTER — Telehealth: Payer: Self-pay | Admitting: *Deleted

## 2018-12-11 NOTE — Telephone Encounter (Signed)
Pt calls and states she checked the price of jardiance and she would have to pay $400.00+. sending to dr Maudie Mercury, dr Sherry Ruffing

## 2018-12-12 NOTE — Telephone Encounter (Addendum)
Tried to offer help with cost, patient declined. She states she prefers to continue glipizide. Advised patient to contact clinic if further assistance is needed. Patient verbalized understanding.

## 2018-12-18 ENCOUNTER — Encounter: Payer: Self-pay | Admitting: Neurology

## 2018-12-18 ENCOUNTER — Telehealth: Payer: Self-pay | Admitting: Neurology

## 2018-12-18 NOTE — Telephone Encounter (Signed)
Sent pt r/s - cancellation letter on 12/18/18

## 2018-12-18 NOTE — Telephone Encounter (Signed)
12/18/18 Attempted to call pt. To r/s due to scheduling error scheduling pt. For 2025. Pt. Needs appt in July 2020 per Dr. Leonie Man

## 2018-12-19 ENCOUNTER — Other Ambulatory Visit: Payer: Self-pay | Admitting: Internal Medicine

## 2018-12-19 DIAGNOSIS — I10 Essential (primary) hypertension: Secondary | ICD-10-CM

## 2018-12-19 NOTE — Telephone Encounter (Signed)
Refill Request    hydrochlorothiazide (HYDRODIURIL) 25 MG tablet   [facility] [place], [place] - [address]

## 2018-12-20 MED ORDER — HYDROCHLOROTHIAZIDE 25 MG PO TABS: 25.0000 mg | ORAL_TABLET | Freq: Every day | ORAL | 1 refills | Status: DC

## 2018-12-25 ENCOUNTER — Encounter: Payer: Self-pay | Admitting: *Deleted

## 2018-12-26 ENCOUNTER — Encounter: Payer: Self-pay | Admitting: *Deleted

## 2019-01-02 ENCOUNTER — Other Ambulatory Visit: Payer: Self-pay

## 2019-01-02 DIAGNOSIS — E118 Type 2 diabetes mellitus with unspecified complications: Secondary | ICD-10-CM

## 2019-01-02 NOTE — Telephone Encounter (Signed)
glipiZIDE (GLUCOTROL) 5 MG tablet(Expired, refill request @  [facility] [address], [place] - [address] [phone] (Phone) [phone] (Fax)

## 2019-01-03 MED ORDER — GLIPIZIDE 5 MG PO TABS: 5.0000 mg | ORAL_TABLET | Freq: Two times a day (BID) | ORAL | 1 refills | Status: DC

## 2019-01-08 DIAGNOSIS — E113413 Type 2 diabetes mellitus with severe nonproliferative diabetic retinopathy with macular edema, bilateral: Secondary | ICD-10-CM | POA: Diagnosis not present

## 2019-01-08 DIAGNOSIS — H40023 Open angle with borderline findings, high risk, bilateral: Secondary | ICD-10-CM | POA: Diagnosis not present

## 2019-01-08 DIAGNOSIS — H43813 Vitreous degeneration, bilateral: Secondary | ICD-10-CM | POA: Diagnosis not present

## 2019-01-08 DIAGNOSIS — H2513 Age-related nuclear cataract, bilateral: Secondary | ICD-10-CM | POA: Diagnosis not present

## 2019-01-17 ENCOUNTER — Encounter (INDEPENDENT_AMBULATORY_CARE_PROVIDER_SITE_OTHER): Payer: Medicare Other | Admitting: Ophthalmology

## 2019-01-25 NOTE — Addendum Note (Signed)
Addended by: Hulan Fray on: 01/25/2019 05:40 PM   Modules accepted: Orders

## 2019-02-05 ENCOUNTER — Encounter: Payer: Medicare Other | Admitting: Internal Medicine

## 2019-02-12 ENCOUNTER — Encounter: Payer: Medicare Other | Admitting: Internal Medicine

## 2019-02-26 ENCOUNTER — Ambulatory Visit (INDEPENDENT_AMBULATORY_CARE_PROVIDER_SITE_OTHER): Payer: Medicare Other | Admitting: Internal Medicine

## 2019-02-26 ENCOUNTER — Other Ambulatory Visit: Payer: Self-pay

## 2019-02-26 ENCOUNTER — Encounter: Payer: Self-pay | Admitting: Internal Medicine

## 2019-02-26 VITALS — BP 165/67 | HR 71 | Temp 98.1°F | Ht 64.0 in | Wt 212.0 lb

## 2019-02-26 DIAGNOSIS — Z79899 Other long term (current) drug therapy: Secondary | ICD-10-CM | POA: Diagnosis not present

## 2019-02-26 DIAGNOSIS — Z87891 Personal history of nicotine dependence: Secondary | ICD-10-CM

## 2019-02-26 DIAGNOSIS — Z7984 Long term (current) use of oral hypoglycemic drugs: Secondary | ICD-10-CM

## 2019-02-26 DIAGNOSIS — E1165 Type 2 diabetes mellitus with hyperglycemia: Secondary | ICD-10-CM

## 2019-02-26 DIAGNOSIS — I10 Essential (primary) hypertension: Secondary | ICD-10-CM | POA: Diagnosis not present

## 2019-02-26 DIAGNOSIS — Z0000 Encounter for general adult medical examination without abnormal findings: Secondary | ICD-10-CM

## 2019-02-26 DIAGNOSIS — Z8673 Personal history of transient ischemic attack (TIA), and cerebral infarction without residual deficits: Secondary | ICD-10-CM | POA: Diagnosis not present

## 2019-02-26 DIAGNOSIS — Z7982 Long term (current) use of aspirin: Secondary | ICD-10-CM

## 2019-02-26 DIAGNOSIS — M1712 Unilateral primary osteoarthritis, left knee: Secondary | ICD-10-CM | POA: Diagnosis not present

## 2019-02-26 DIAGNOSIS — E118 Type 2 diabetes mellitus with unspecified complications: Secondary | ICD-10-CM

## 2019-02-26 LAB — GLUCOSE, CAPILLARY: Glucose-Capillary: 99 mg/dL (ref 70–99)

## 2019-02-26 LAB — POCT GLYCOSYLATED HEMOGLOBIN (HGB A1C): Hemoglobin A1C: 6.9 % — AB (ref 4.0–5.6)

## 2019-02-26 NOTE — Patient Instructions (Addendum)
Ms. Wendy [name],  It was a pleasure to see you today. Thank you for coming in.   Today we discussed your diabetes. In regards to this you can continue taking the glipizide. Please start checking your blood sugars daily, and contact us if it drops below 70.  Please find the name of your eye doctor and give [place] a call with that information.   We also discussed your blood pressure. Please start taking the lisinopril daily, continue taking the HCTZ.   Please look up Zetia ([email or web address]), this may be able to replace your statin that you are on. It is important for you to be on a cholesterol medication to decrease your risk of another stroke.   Please return to clinic in 3 months or sooner if needed.   Thank you again for coming in.   Asencion Noble.D.

## 2019-02-26 NOTE — Progress Notes (Signed)
   CC: Diabetes, HTN, hx of CVA, left hip pain  HPI:  [name] is a 68 y.o.  with a PMH listed below presenting for diabetes, HTN, hx of CVA, left hip pain.   Please see A&P for status of the patient's chronic medical conditions  Past Medical History:  Diagnosis Date  . Diabetes mellitus   . Hypertension   . Sarcoidosis   . Stroke [facility])    Review of Systems: Refer to history of present illness and assessment and plans for pertinent review of systems, all others reviewed and negative.  Physical Exam:  Vitals:   02/26/19 1647 02/26/19 1655  BP: (!) 149/53 (!) 165/67  Pulse: 77 71  Temp: 98.1 F (36.7 C)   TempSrc: Oral   SpO2: 100%   Weight: 212 lb (96.2 kg)   Height: 5\' 4"  (1.626 m)     Physical Exam  Constitutional: She is oriented to person, place, and time and well-developed, well-nourished, and in no distress.  In wheelchair  Cardiovascular: Normal rate, regular rhythm and normal heart sounds.  Pulmonary/Chest: Effort normal and breath sounds normal. No respiratory distress.  Abdominal: Soft. Bowel sounds are normal. She exhibits no distension.  Musculoskeletal:     Comments: Left hip limited ROM due to pain  Neurological: She is alert and oriented to person, place, and time.  Skin: Skin is warm and dry.  Psychiatric: Mood and affect normal.    Social History   Socioeconomic History  . Marital status: Married    Spouse name: Not on file  . Number of children: Not on file  . Years of education: Not on file  . Highest education level: Not on file  Occupational History  . Not on file  Social Needs  . Financial resource strain: Not on file  . Food insecurity    Worry: Not on file    Inability: Not on file  . Transportation needs    Medical: Not on file    Non-medical: Not on file  Tobacco Use  . Smoking status: Former Smoker    Quit date: 08/09/1976    Years since quitting: 42.5  . Smokeless tobacco: Never Used  Substance and Sexual Activity   . Alcohol use: No    Alcohol/week: 0.0 standard drinks  . Drug use: No  . Sexual activity: Not on file  Lifestyle  . Physical activity    Days per week: Not on file    Minutes per session: Not on file  . Stress: Not on file  Relationships  . Social Herbalist on phone: Not on file    Gets together: Not on file    Attends religious service: Not on file    Active member of club or organization: Not on file    Attends meetings of clubs or organizations: Not on file    Relationship status: Not on file  . Intimate partner violence    Fear of current or ex partner: Not on file    Emotionally abused: Not on file    Physically abused: Not on file    Forced sexual activity: Not on file  Other Topics Concern  . Not on file  Social History Narrative  . Not on file    Family History  Problem Relation Age of Onset  . Hypertension Son     Assessment & Plan:   See Encounters Tab for problem based charting.  Patient discussed with Dr. Dareen Piano

## 2019-02-28 DIAGNOSIS — Z0000 Encounter for general adult medical examination without abnormal findings: Secondary | ICD-10-CM | POA: Insufficient documentation

## 2019-02-28 NOTE — Assessment & Plan Note (Signed)
Prior avute CVA of L MCA, initially with speech difficulties. Denies any residual symptoms at this time. Patient is currently on aspirin, she is prescribed Crestor however patient reported that she has not been taking this. She reported that she has had side effects to the statins in the past and does not want to take them. We discussed the importance of taking a cholesterol lower drug to help decrease the risk of strokes. We discussed maybe trying a different medication such as ezetimibe, she reported that she wanted to look this up before starting this.   -Continue taking ASA 81 mg daily -Discontinue crestor, patient did not want to take this -Try to start zetia if patient is agreeable

## 2019-02-28 NOTE — Assessment & Plan Note (Signed)
Patient continues to have left knee pain, she follows with orthopedics and she reports that she needs her blood sugars to be better controlled prior to any surgical intervention.  She also expressed hesitation about any surgical intervention.  Discussed that her blood sugars are better controlled and she can continue to follow with orthopedics to discuss further treatment.

## 2019-02-28 NOTE — Assessment & Plan Note (Signed)
Patient is currently precribed lisinopril 20 mg daily and HCTZ 25 mg daily, she reports that she does not take them every day, skips them every few days. BP today is elevated. 149/53, repeat was 165/67.  Discussed the importance of taking her medications, discussed the increased risk for repeat CVA with elevated blood pressure.  Patient reported understanding and stated that she will try to take them daily.  On previous visits patient was hesitant to increase her blood pressure medications.   -Continue lisinopril 20 mg daily -Continue HCTZ 25 mg daily -Continue to further discuss the importance of BP control on follow-up visits

## 2019-02-28 NOTE — Assessment & Plan Note (Signed)
Ordered FOBT test

## 2019-02-28 NOTE — Assessment & Plan Note (Signed)
Patient is currently on glipizide 5 mg daily. She was prescribed Jardiance  on her last visit due to concern for hypoglycemia, however she reports that this was too expensive for her so she did not take this.  On chart review it seems that our pharmacist tried to discuss helping with the payment of her medications and patient declined.  Patient reports that she has a decreased appetite recently. She also endorses episodes of lightheadedness and dizziness that occur every few days, that has been worsening over the past few weeks.  Patient reports that she does not check her blood sugars at home.  She reported that her daughter-in-law can help her check her blood sugars.  A1c today was 6.9.  Discussed adjusting regimen due to the concern for hypoglycemia however patient not want to change anything at this time.  -Continue glipizide 5 mg daily -Advised patient to check her blood sugars at least daily

## 2019-03-06 NOTE — Progress Notes (Signed)
Internal Medicine Clinic Attending  Case discussed with Dr. Krienke at the time of the visit.  We reviewed the resident's history and exam and pertinent patient test results.  I agree with the assessment, diagnosis, and plan of care documented in the resident's note.    

## 2019-03-21 ENCOUNTER — Other Ambulatory Visit: Payer: Self-pay | Admitting: Internal Medicine

## 2019-04-25 ENCOUNTER — Other Ambulatory Visit: Payer: Self-pay

## 2019-04-25 NOTE — Telephone Encounter (Signed)
Requesting to speak with about hydrochlorothiazide (HYDRODIURIL) 25 MG tablet. Please call pt back.

## 2019-04-25 NOTE — Telephone Encounter (Signed)
Called pharm, states they never rec'd script in July, gave to them VO. They will fill

## 2019-05-18 ENCOUNTER — Telehealth: Payer: Self-pay | Admitting: Internal Medicine

## 2019-05-18 DIAGNOSIS — G500 Trigeminal neuralgia: Secondary | ICD-10-CM | POA: Diagnosis not present

## 2019-05-18 DIAGNOSIS — H43813 Vitreous degeneration, bilateral: Secondary | ICD-10-CM | POA: Diagnosis not present

## 2019-05-18 DIAGNOSIS — H2513 Age-related nuclear cataract, bilateral: Secondary | ICD-10-CM | POA: Diagnosis not present

## 2019-05-18 DIAGNOSIS — E113413 Type 2 diabetes mellitus with severe nonproliferative diabetic retinopathy with macular edema, bilateral: Secondary | ICD-10-CM | POA: Diagnosis not present

## 2019-05-18 DIAGNOSIS — H4312 Vitreous hemorrhage, left eye: Secondary | ICD-10-CM | POA: Diagnosis not present

## 2019-05-18 DIAGNOSIS — H40023 Open angle with borderline findings, high risk, bilateral: Secondary | ICD-10-CM | POA: Diagnosis not present

## 2019-05-18 NOTE — Telephone Encounter (Signed)
Pt contact

## 2019-05-26 ENCOUNTER — Other Ambulatory Visit: Payer: Self-pay | Admitting: Internal Medicine

## 2019-05-26 DIAGNOSIS — E118 Type 2 diabetes mellitus with unspecified complications: Secondary | ICD-10-CM

## 2019-05-28 NOTE — Telephone Encounter (Signed)
Pt is calling regarding medicine; pls contact [phone]

## 2019-05-29 NOTE — Telephone Encounter (Signed)
Pt rtn call about refill.

## 2019-05-31 ENCOUNTER — Other Ambulatory Visit: Payer: Self-pay

## 2019-05-31 ENCOUNTER — Encounter (INDEPENDENT_AMBULATORY_CARE_PROVIDER_SITE_OTHER): Payer: Medicare Other | Admitting: Ophthalmology

## 2019-05-31 DIAGNOSIS — H2513 Age-related nuclear cataract, bilateral: Secondary | ICD-10-CM

## 2019-05-31 DIAGNOSIS — E11311 Type 2 diabetes mellitus with unspecified diabetic retinopathy with macular edema: Secondary | ICD-10-CM

## 2019-05-31 DIAGNOSIS — E113413 Type 2 diabetes mellitus with severe nonproliferative diabetic retinopathy with macular edema, bilateral: Secondary | ICD-10-CM

## 2019-05-31 DIAGNOSIS — I10 Essential (primary) hypertension: Secondary | ICD-10-CM | POA: Diagnosis not present

## 2019-05-31 DIAGNOSIS — H43813 Vitreous degeneration, bilateral: Secondary | ICD-10-CM

## 2019-05-31 DIAGNOSIS — H35033 Hypertensive retinopathy, bilateral: Secondary | ICD-10-CM

## 2019-06-04 ENCOUNTER — Other Ambulatory Visit: Payer: Self-pay | Admitting: Internal Medicine

## 2019-06-04 ENCOUNTER — Telehealth: Payer: Self-pay | Admitting: Internal Medicine

## 2019-06-04 DIAGNOSIS — E118 Type 2 diabetes mellitus with unspecified complications: Secondary | ICD-10-CM

## 2019-06-04 NOTE — Telephone Encounter (Signed)
Pls contact pt regarding medicine; pls contact [phone]

## 2019-06-04 NOTE — Telephone Encounter (Signed)
Called pt - asking about Glipizide refill; informed it was refilled on 12/15, sent to [facility] on [address].

## 2019-07-16 ENCOUNTER — Encounter: Payer: Self-pay | Admitting: Radiation Oncology

## 2019-07-16 ENCOUNTER — Ambulatory Visit (INDEPENDENT_AMBULATORY_CARE_PROVIDER_SITE_OTHER): Payer: Medicare Other | Admitting: Radiation Oncology

## 2019-07-16 VITALS — BP 148/79 | HR 85 | Temp 97.9°F | Ht 64.0 in | Wt 204.3 lb

## 2019-07-16 DIAGNOSIS — M1990 Unspecified osteoarthritis, unspecified site: Secondary | ICD-10-CM | POA: Diagnosis not present

## 2019-07-16 DIAGNOSIS — L819 Disorder of pigmentation, unspecified: Secondary | ICD-10-CM

## 2019-07-16 DIAGNOSIS — D229 Melanocytic nevi, unspecified: Secondary | ICD-10-CM

## 2019-07-16 DIAGNOSIS — E1165 Type 2 diabetes mellitus with hyperglycemia: Secondary | ICD-10-CM

## 2019-07-16 DIAGNOSIS — M25551 Pain in right hip: Secondary | ICD-10-CM

## 2019-07-16 DIAGNOSIS — I10 Essential (primary) hypertension: Secondary | ICD-10-CM | POA: Diagnosis not present

## 2019-07-16 HISTORY — DX: Disorder of pigmentation, unspecified: L81.9

## 2019-07-16 LAB — POCT GLYCOSYLATED HEMOGLOBIN (HGB A1C): Hemoglobin A1C: 6.3 % — AB (ref 4.0–5.6)

## 2019-07-16 LAB — GLUCOSE, CAPILLARY: Glucose-Capillary: 150 mg/dL — ABNORMAL HIGH (ref 70–99)

## 2019-07-16 MED ORDER — LISINOPRIL 20 MG PO TABS: 20.0000 mg | ORAL_TABLET | Freq: Every day | ORAL | 3 refills | Status: DC

## 2019-07-16 MED ORDER — LISINOPRIL-HYDROCHLOROTHIAZIDE 20-25 MG PO TABS: 1.0000 | ORAL_TABLET | Freq: Every day | ORAL | 3 refills | Status: DC

## 2019-07-16 MED FILL — LISINOPRIL-HCTZ 20-25 MG TA: 20-25 | 30 days supply | Qty: 30 | Fill #0

## 2019-07-16 NOTE — Assessment & Plan Note (Signed)
Pt with hx of controlled type 2 DM here for A1c check prior to eye procedure tomorrow.   A1c 5 mo prior was 6.9. A1c 6.3 today.  Patient takes glipizide 5 mg daily and reports a major change in her diet recently. She has eliminated bread and lost around 40 lbs.   She follows with ophthalmology for her retinopathy. She has an appointment for an eye injection with them tomorrow. Unclear if she requires medical clearance for this procedure. Patient states they need her A1c which was obtained above. As far as her Revised Cardiac Risk Index for Pre-Operative Risk, her risk is 6%, Class II risk due to her hx of CVA. Additional work up required but given the procedure tomorrow is very low risk, she is medically stable for this. More invasive procedures would likely require further testing including BNP and EKG. Given her low METS score secondary to osteoarthritis, she may require referral to cardiology for clearance preoperatively.   Plan for well controlled DM2: -continue glipizide -continue dietary changes -keep up the good work

## 2019-07-16 NOTE — Assessment & Plan Note (Addendum)
Today's Vitals   07/16/19 1332  BP: (!) 148/79  Pulse: 85  Temp: 97.9 F (36.6 C)  TempSrc: Oral  SpO2: 100%  Weight: 204 lb 4.8 oz (92.7 kg)  Height: 5\' 4"  (1.626 m)  PainSc: 0-No pain   Body mass index is 35.07 kg/m.  Pt with hx of uncontrolled htn secondary to poor adherence to medication regimen. Patient reports she takes her HCTZ daily but her lisinopril "sparingly" as she does not like taking so many pills. I asked if she would like a combo pill and she was very interested in that.   Plan: Zestoretic 20-25 daily

## 2019-07-16 NOTE — Assessment & Plan Note (Addendum)
Patient with hx of right hip s/p injection without improvement now with compensatory L knee pain. Patient has had knee imaging completed one year prior with orthopedics. Patient states her arthritis makes it difficult for her to walk around, go up stairs and complete her ADLs. On exam no edema, deformity or tenderness.  Patient has mobility limitations which cannot be resolved with a cane, crutch or walker. Patient can safely self propel the wheelchair or has a caregiver who can assist at all times.  Plan: -fu with orthopaedic surgery -shower chair -wheelchair

## 2019-07-16 NOTE — Progress Notes (Signed)
   CC: diabetes and arthritis  HPI:  [name] is a 69 y.o. female who presents to the Internal Medicine Clinic for follow up of their chronic medical problems. Please see Assessment and Plan for full HPI.  Past Medical History:  Diagnosis Date  . Diabetes mellitus   . Hypertension   . Sarcoidosis   . Stroke [facility])    Family hx of breast cancer in sister and niece. No hx of skin cancers.  Review of Systems:  Please see Assessment and Plan for full ROS.  Physical Exam:  Vitals:   07/16/19 1332  BP: (!) 148/79  Pulse: 85  Temp: 97.9 F (36.6 C)  TempSrc: Oral  SpO2: 100%  Weight: 204 lb 4.8 oz (92.7 kg)  Height: 5\' 4"  (1.626 m)   Physical Exam  Constitutional: She is oriented to person, place, and time and well-developed, well-nourished, and in no distress. No distress.  Sitting in wheelchair.  Cardiovascular: Normal rate, regular rhythm, normal heart sounds and intact distal pulses.  No murmur heard. No LE edema  Pulmonary/Chest: Effort normal and breath sounds normal. No respiratory distress. She exhibits no tenderness.  Abdominal: Soft. Bowel sounds are normal. She exhibits no distension. There is no abdominal tenderness.  Musculoskeletal:        General: No tenderness, deformity or edema. Normal range of motion.  Neurological: She is alert and oriented to person, place, and time.  Skin: Skin is warm and dry. She is not diaphoretic.  On epigastric area of abdomen, there is a 5 mm hyperpigmented lesion similarly appearing to a nevus with an area of central scarring or eschar. On palpation there is a subcutaneous nodule less than 1 cm by 1 cm, firm but mobile.    Psychiatric: Affect normal.  Nursing note and vitals reviewed.  Assessment & Plan:   See Encounters Tab for problem based charting.  Patient discussed with Dr. Heber [place]

## 2019-07-16 NOTE — Patient Instructions (Addendum)
Thank you for coming to your appointment. It was so nice to see you. Today we discussed  Melinna was seen today for follow-up.  Diagnoses and all orders for this visit:  Essential hypertension, benign -     Discontinue: lisinopril and hydrochlorothiazide -     Start Zestoretic combo pill, once daily  Uncontrolled type 2 diabetes mellitus with hyperglycemia ([place]) -     POC Hbg A1C - 6.3 keep up the good work  Pain of right hip joint -     Shower chair -     DME Wheelchair manual -     Follow up with your orthopaedic surgeon   Atypical mole -     Ambulatory referral to Dermatology  If labs were drawn today, I will call you with any abnormal results. Otherwise, please keep up the good work. I look forward to seeing you again soon at your follow up in 3 months.  Sincerely,  Al Decant, MD

## 2019-07-17 ENCOUNTER — Encounter (INDEPENDENT_AMBULATORY_CARE_PROVIDER_SITE_OTHER): Payer: Medicare Other | Admitting: Ophthalmology

## 2019-07-17 ENCOUNTER — Telehealth: Payer: Self-pay | Admitting: *Deleted

## 2019-07-17 ENCOUNTER — Other Ambulatory Visit: Payer: Self-pay

## 2019-07-17 DIAGNOSIS — E11311 Type 2 diabetes mellitus with unspecified diabetic retinopathy with macular edema: Secondary | ICD-10-CM | POA: Diagnosis not present

## 2019-07-17 DIAGNOSIS — E113413 Type 2 diabetes mellitus with severe nonproliferative diabetic retinopathy with macular edema, bilateral: Secondary | ICD-10-CM

## 2019-07-17 DIAGNOSIS — H43813 Vitreous degeneration, bilateral: Secondary | ICD-10-CM

## 2019-07-17 DIAGNOSIS — I10 Essential (primary) hypertension: Secondary | ICD-10-CM

## 2019-07-17 DIAGNOSIS — H35033 Hypertensive retinopathy, bilateral: Secondary | ICD-10-CM | POA: Diagnosis not present

## 2019-07-17 NOTE — Telephone Encounter (Signed)
Jovita Kussmaul, RN; [place], Jeanie Cooks, Delaney Meigs, [place]; [place], [place], [place]   Got it.

## 2019-07-17 NOTE — Telephone Encounter (Signed)
Verified patient is able to get into and out of tub on own. She is aware to expect call from [employer] regarding orders for shower chair and manual w/c. Community message sent to Jolee Ewing at [facility] for these orders. F2F was yesterday. Hubbard Hartshorn, BSN, RN-BC

## 2019-07-18 ENCOUNTER — Telehealth: Payer: Self-pay | Admitting: *Deleted

## 2019-07-18 NOTE — Telephone Encounter (Signed)
Pt calls and states she went to eye doctor and he gave her a prescription but it is 186.00 at [place]- that is her copay. Have called op pharm will see if there is something that can be done, she states eye dr told her she would go blind if she doesn't have the medicine

## 2019-07-18 NOTE — Progress Notes (Signed)
Internal Medicine Clinic Attending  Case discussed with Dr. Lanierat the time of the visit.  We reviewed the resident's history and exam and pertinent patient test results.  I agree with the assessment, diagnosis, and plan of care documented in the resident's note.   

## 2019-07-19 ENCOUNTER — Telehealth: Payer: Self-pay | Admitting: Internal Medicine

## 2019-07-19 NOTE — Telephone Encounter (Signed)
Pt calling regarding her shower chair; pls contact [phone]

## 2019-07-23 NOTE — Telephone Encounter (Signed)
I returned patients phone and she had not received her her shower chair or wheelchair. All information has been sent in, I told her the company would get in touch with her to let her no when they would be coming to deliver her equipment [place], [place] C2/8/20219:59 AM

## 2019-07-23 NOTE — Telephone Encounter (Signed)
**Note Wendy-Identified via Obfuscation** RE: home health equipment Received: Today Message Contents  [name], Wendy Ahmadi, Wendy [name], Wendy [name]; [place], Wendy [name], Wendy [name], Wendy [name]  I apologize, I see we pulled this but I am not sure what happened after that, as I was out of the office. I have reviewed the notes and In order for [place] to finish processing this we need the office notes not the note on the order to be updated to state---Patient has mobility limitations which cannot be resolved with a cane, crutch or walker. Patient can safely self propel the wheelchair or has a caregiver who can assist at all times.   Please advise when this has been done and we will process the orders.

## 2019-07-26 NOTE — Telephone Encounter (Signed)
Community message sent to Jolee Ewing at [facility] that [place] note has been updated. Hubbard Hartshorn, BSN, RN-BC

## 2019-07-26 NOTE — Telephone Encounter (Signed)
Maris Berger, Orvis Brill, RN; [place], Lenoria Farrier, [place] Breed, [place]; [place], [place], [place]   Thank you, I will get them pulled

## 2019-07-26 NOTE — Telephone Encounter (Signed)
This has been updated.

## 2019-07-27 ENCOUNTER — Encounter (INDEPENDENT_AMBULATORY_CARE_PROVIDER_SITE_OTHER): Payer: Medicare Other | Admitting: Ophthalmology

## 2019-07-31 ENCOUNTER — Telehealth: Payer: Self-pay | Admitting: Internal Medicine

## 2019-07-31 NOTE — Telephone Encounter (Signed)
Attempted to reach pt, no answer.  No DPR on record.  Left generic VM advising pt to call back if she still has questions.

## 2019-07-31 NOTE — Telephone Encounter (Signed)
Pt is calling in to speak with our Device team or Nurse in charge of loop recorders.  Pt states she had a loop recorder placed back in 2017, which was ordered by her Neurologist for Cryptogenic stroke. Pt states back on 03/24/17, she called our office to report she could not afford monthly [employer] monitoring and was not up to having checks done every 3 months. Pt discussed at that time having her loop taken out, but she did not want to come in to discuss this, for this would mean more charges and "bills."  Pt then just unplugged her [employer] monitoring device, and has for the last 2 years, not been monitored by [employer] services.  Pt did tell our office that she would call back in the future, when she wanted to discuss coming in to have her loop removed.   Pt states that brings her to the point why she is calling in today.  She would like her loop removed, since she is not being monitored, and she states she has a small mole/bump at the site where this is located. Pt states she has noticed over some time that she developed a small mole/bump at the area of the loop.  Pt went to her PCP about this, and she diagnosed her with atypical mole, and referred her to Dermatology.  Pt states the area is not painful.  Pt states her Dermatology appt for this is 08/16/19.  Pt is inquiring if the loop is causing this issue, then she could just cancel her Dermatology referral, for this would be another bill for her.  Informed the pt that I will need to route this message to our Device RN who follows the loops, for further follow-up with the pt.  Also informed the pt that Dr. Lovena Le is out of the office today, but I will also route this communication to him too.  Informed the pt that our Device nurse will follow-up with her soon. Pt verbalized understanding and agrees with this plan.

## 2019-07-31 NOTE — Telephone Encounter (Signed)
Patient calling stating no heart condition was found since she's had her loop recorder inserted. She would like to know if this will need to be removed or if it will stay forever. Please advise.

## 2019-08-01 ENCOUNTER — Encounter (INDEPENDENT_AMBULATORY_CARE_PROVIDER_SITE_OTHER): Payer: Medicare Other | Admitting: Ophthalmology

## 2019-08-01 ENCOUNTER — Other Ambulatory Visit: Payer: Self-pay

## 2019-08-01 DIAGNOSIS — H2513 Age-related nuclear cataract, bilateral: Secondary | ICD-10-CM | POA: Diagnosis not present

## 2019-08-01 DIAGNOSIS — E11311 Type 2 diabetes mellitus with unspecified diabetic retinopathy with macular edema: Secondary | ICD-10-CM

## 2019-08-01 DIAGNOSIS — E113313 Type 2 diabetes mellitus with moderate nonproliferative diabetic retinopathy with macular edema, bilateral: Secondary | ICD-10-CM | POA: Diagnosis not present

## 2019-08-01 DIAGNOSIS — H35033 Hypertensive retinopathy, bilateral: Secondary | ICD-10-CM

## 2019-08-01 DIAGNOSIS — H43813 Vitreous degeneration, bilateral: Secondary | ICD-10-CM

## 2019-08-01 DIAGNOSIS — I10 Essential (primary) hypertension: Secondary | ICD-10-CM

## 2019-08-03 ENCOUNTER — Encounter: Payer: Self-pay | Admitting: *Deleted

## 2019-08-03 NOTE — Telephone Encounter (Signed)
Spoke with patient. LINQ implanted in 09/2015 but monitoring discontinued in 03/2017 due to cost per pt request. Pt reports that she noted a bump that started growing in size near her loop recorder (possibly at the incision scar, but she cannot tell for certain). Pt reports that PCP diagnosed it as an atypical mole and referred to dermatology. Pt has appointment with dermatologist on 08/16/19 and would prefer that Dr. Lovena Le see her instead to see if it is related to her ILR.  Explained to pt that Dr. Lovena Le does not diagnose or treat skin conditions and that the likelihood of the mole being related to her ILR. Advised pt to keep f/u with dermatologist and if derm advises it is safe to proceed with explant, we can schedule her for an appointment to discuss explant. Pt verbalizes understanding of information. Direct DC number provided in the event pt wishes to schedule appointment. She denies any additional questions or concerns at this time.

## 2019-08-03 NOTE — Progress Notes (Signed)
Opened in error

## 2019-08-06 ENCOUNTER — Telehealth: Payer: Self-pay

## 2019-08-06 NOTE — Telephone Encounter (Signed)
Pt calls and states since changing med to lisinopril/hctz she has h/a every day and feels horrible. Wants to know if she can go back to just hctz 25mg  Please advise, she states you can call her but she just wants to feel better and go back to 25mg  HCTZ

## 2019-08-06 NOTE — Telephone Encounter (Signed)
Requesting to speak with a nurse about meds, please call pt back.  

## 2019-08-08 NOTE — Telephone Encounter (Signed)
Pt reporting she called on 08/06/2019 about her medications and headaches. Pt states she has not heard back.  Patient would like a call back about what she should do.

## 2019-08-14 ENCOUNTER — Encounter (INDEPENDENT_AMBULATORY_CARE_PROVIDER_SITE_OTHER): Payer: Medicare Other | Admitting: Ophthalmology

## 2019-08-14 DIAGNOSIS — H2513 Age-related nuclear cataract, bilateral: Secondary | ICD-10-CM | POA: Diagnosis not present

## 2019-08-14 DIAGNOSIS — H43813 Vitreous degeneration, bilateral: Secondary | ICD-10-CM

## 2019-08-14 DIAGNOSIS — H35033 Hypertensive retinopathy, bilateral: Secondary | ICD-10-CM | POA: Diagnosis not present

## 2019-08-14 DIAGNOSIS — I10 Essential (primary) hypertension: Secondary | ICD-10-CM

## 2019-08-14 DIAGNOSIS — E11311 Type 2 diabetes mellitus with unspecified diabetic retinopathy with macular edema: Secondary | ICD-10-CM | POA: Diagnosis not present

## 2019-08-14 DIAGNOSIS — E113313 Type 2 diabetes mellitus with moderate nonproliferative diabetic retinopathy with macular edema, bilateral: Secondary | ICD-10-CM

## 2019-08-15 NOTE — Telephone Encounter (Signed)
TC to patient, informed of Dr. Wilber Bihari suggestion of appt to discuss b/p readings and need for HCTZ.  She states she can't do an in person appt but is agreeable to a telephone appt. RN instructed pt to have her b/p values written down for MD at time of her appt. She verbalized understanding. Telephone appt made for 08/20/19 @ 1:45 in [facility]. SChaplin, RN,BSN

## 2019-08-15 NOTE — Telephone Encounter (Signed)
Can we make a telehealth appointment or in person appointment for her?  I would like to get an idea where her BP is running after stopping the medication and see if she even needs the HCTZ

## 2019-08-15 NOTE — Telephone Encounter (Signed)
Thank you :)

## 2019-08-15 NOTE — Telephone Encounter (Signed)
Received TC from pt, she states she has called multiple times to see if she can stop her lisinopril/HCTZ and go back to taking HCTZ 25mg  (see telephone notes below).  States lisinopril/HCTZ caused h/a's and made her feel horrible.  Pt states she has stopped taking this medication and feels much better.  Pt states she is getting eye injections and they are checking her b/p before each injection and told her it has been normal. Please advise if she can go back to taking HCTZ 25mg  and sent RX if appropriate. Thank you, SChaplin, RN,BSN

## 2019-08-16 DIAGNOSIS — L723 Sebaceous cyst: Secondary | ICD-10-CM | POA: Diagnosis not present

## 2019-08-20 ENCOUNTER — Ambulatory Visit (INDEPENDENT_AMBULATORY_CARE_PROVIDER_SITE_OTHER): Payer: Medicare Other | Admitting: Internal Medicine

## 2019-08-20 ENCOUNTER — Other Ambulatory Visit: Payer: Self-pay

## 2019-08-20 ENCOUNTER — Encounter: Payer: Self-pay | Admitting: Internal Medicine

## 2019-08-20 DIAGNOSIS — I10 Essential (primary) hypertension: Secondary | ICD-10-CM

## 2019-08-20 MED ORDER — HYDROCHLOROTHIAZIDE 12.5 MG PO TABS: 12.5000 mg | ORAL_TABLET | Freq: Every day | ORAL | 2 refills | Status: DC

## 2019-08-20 NOTE — Progress Notes (Signed)
  This is a telephone encounter between Wendy [name] and Wendy [name] on 08/20/2019. The visit was conducted with the patient located at home and Wendy [name] at [facility]. The patient's identity was confirmed using their DOB and current address. The patient has consented to being evaluated through a telephone encounter and understands the associated risks (an examination cannot be done and the patient may need to come in for an appointment) / benefits (allows the patient to remain at home, decreasing exposure to coronavirus). I personally spent 7 minutes on medical discussion.  CC: Hypertension  HPI:  [name] is a 69 y.o. F with PMHx listed below presenting for Hypertension. Please see the A&P for the status of the patient's chronic medical problems.  Past Medical History:  Diagnosis Date  . Diabetes mellitus   . Hypertension   . Sarcoidosis   . Stroke [facility])    Review of Systems:  Performed and all others negative.  Physical Exam:  There were no vitals filed for this visit. Not performed to tele-health visit   Assessment & Plan:   See Encounters Tab for problem based charting.  Patient discussed with Dr. Dareen Piano

## 2019-08-20 NOTE — Assessment & Plan Note (Signed)
Patient contacted for telephone encounter to discuss her blood pressure. She was switched to a combination pill of lisinopril-HCTZ in Feb to help with adherence. After starting this medication she experienced significant side effects of headaches and general feeling of unwell. She stopped the medication and her symptoms improved. She was asked to track her blood pressure to determine medication need.  Taking no medications, she reports a BP at home usually in the [id number] systolic with 1 very high reading on [id number] systolic. Given this, I will restart her HCTZ at a reduced dose and have her continue to check at home. If her BP is significantly elevated she is to follow up, but if it remains 130s-140s she is to follow up at usual PCP visit. - HCTZ 12.5mg  Daily

## 2019-08-20 NOTE — Progress Notes (Signed)
Internal Medicine Clinic Attending  Case discussed with Dr. Melvin  at the time of the visit.  We reviewed the resident's history and exam and pertinent patient test results.  I agree with the assessment, diagnosis, and plan of care documented in the resident's note.  

## 2019-08-21 ENCOUNTER — Telehealth: Payer: Self-pay | Admitting: Internal Medicine

## 2019-08-21 NOTE — Telephone Encounter (Signed)
Patient requesting to speak with Raquel Sarna in device clinic. She states that they spoke previously in regards to having her loop recorder removed.

## 2019-08-21 NOTE — Telephone Encounter (Signed)
Returned phone call to pt.  She reports she has ssen Dermatology for the atypical mole that developed over the ILR implant site.  Per pt it was daignosed as a benign cyst unrelated to the ILR, she was advised it would be ok to proceed with have Loop recorder removed.  Pt questioned what procedure entailed, explained it is done in office taking about 45-24minutes including MD eval, consents and recovery time.  MD will use topical/ local anesthetic to numb the area, make a small incision to retrieve device and then close usually with a steri strip.  Pt would like to proceed with scheduling.  Advised will have scheduling contact her to arrange.

## 2019-09-03 ENCOUNTER — Encounter (INDEPENDENT_AMBULATORY_CARE_PROVIDER_SITE_OTHER): Payer: PRIVATE HEALTH INSURANCE | Admitting: Ophthalmology

## 2019-09-06 DIAGNOSIS — M25562 Pain in left knee: Secondary | ICD-10-CM | POA: Diagnosis not present

## 2019-09-06 DIAGNOSIS — M25561 Pain in right knee: Secondary | ICD-10-CM | POA: Diagnosis not present

## 2019-09-06 DIAGNOSIS — M222X1 Patellofemoral disorders, right knee: Secondary | ICD-10-CM | POA: Diagnosis not present

## 2019-09-06 DIAGNOSIS — M1711 Unilateral primary osteoarthritis, right knee: Secondary | ICD-10-CM | POA: Diagnosis not present

## 2019-09-06 DIAGNOSIS — R2689 Other abnormalities of gait and mobility: Secondary | ICD-10-CM | POA: Diagnosis not present

## 2019-09-06 DIAGNOSIS — M222X2 Patellofemoral disorders, left knee: Secondary | ICD-10-CM | POA: Diagnosis not present

## 2019-09-06 DIAGNOSIS — M25762 Osteophyte, left knee: Secondary | ICD-10-CM | POA: Diagnosis not present

## 2019-09-06 DIAGNOSIS — M1712 Unilateral primary osteoarthritis, left knee: Secondary | ICD-10-CM | POA: Diagnosis not present

## 2019-09-06 DIAGNOSIS — M25761 Osteophyte, right knee: Secondary | ICD-10-CM | POA: Diagnosis not present

## 2019-09-11 DIAGNOSIS — M1712 Unilateral primary osteoarthritis, left knee: Secondary | ICD-10-CM | POA: Diagnosis not present

## 2019-09-11 DIAGNOSIS — M25762 Osteophyte, left knee: Secondary | ICD-10-CM | POA: Diagnosis not present

## 2019-09-11 DIAGNOSIS — M25562 Pain in left knee: Secondary | ICD-10-CM | POA: Diagnosis not present

## 2019-09-18 ENCOUNTER — Other Ambulatory Visit: Payer: Self-pay

## 2019-09-18 ENCOUNTER — Encounter (INDEPENDENT_AMBULATORY_CARE_PROVIDER_SITE_OTHER): Payer: Medicare Other | Admitting: Ophthalmology

## 2019-09-18 DIAGNOSIS — H35033 Hypertensive retinopathy, bilateral: Secondary | ICD-10-CM

## 2019-09-18 DIAGNOSIS — E11311 Type 2 diabetes mellitus with unspecified diabetic retinopathy with macular edema: Secondary | ICD-10-CM | POA: Diagnosis not present

## 2019-09-18 DIAGNOSIS — H43813 Vitreous degeneration, bilateral: Secondary | ICD-10-CM

## 2019-09-18 DIAGNOSIS — I10 Essential (primary) hypertension: Secondary | ICD-10-CM

## 2019-09-18 DIAGNOSIS — E113313 Type 2 diabetes mellitus with moderate nonproliferative diabetic retinopathy with macular edema, bilateral: Secondary | ICD-10-CM | POA: Diagnosis not present

## 2019-09-19 DIAGNOSIS — M25762 Osteophyte, left knee: Secondary | ICD-10-CM | POA: Diagnosis not present

## 2019-09-19 DIAGNOSIS — M222X2 Patellofemoral disorders, left knee: Secondary | ICD-10-CM | POA: Diagnosis not present

## 2019-09-19 DIAGNOSIS — M1712 Unilateral primary osteoarthritis, left knee: Secondary | ICD-10-CM | POA: Diagnosis not present

## 2019-09-19 DIAGNOSIS — M25562 Pain in left knee: Secondary | ICD-10-CM | POA: Diagnosis not present

## 2019-09-20 DIAGNOSIS — M67351 Transient synovitis, right hip: Secondary | ICD-10-CM | POA: Diagnosis not present

## 2019-09-26 DIAGNOSIS — M222X2 Patellofemoral disorders, left knee: Secondary | ICD-10-CM | POA: Diagnosis not present

## 2019-09-26 DIAGNOSIS — M25562 Pain in left knee: Secondary | ICD-10-CM | POA: Diagnosis not present

## 2019-09-26 DIAGNOSIS — M1712 Unilateral primary osteoarthritis, left knee: Secondary | ICD-10-CM | POA: Diagnosis not present

## 2019-09-26 DIAGNOSIS — R2689 Other abnormalities of gait and mobility: Secondary | ICD-10-CM | POA: Diagnosis not present

## 2019-09-26 DIAGNOSIS — M25762 Osteophyte, left knee: Secondary | ICD-10-CM | POA: Diagnosis not present

## 2019-10-02 ENCOUNTER — Encounter: Payer: Self-pay | Admitting: Internal Medicine

## 2019-10-02 ENCOUNTER — Other Ambulatory Visit: Payer: Self-pay

## 2019-10-02 ENCOUNTER — Ambulatory Visit (INDEPENDENT_AMBULATORY_CARE_PROVIDER_SITE_OTHER): Payer: Medicare Other | Admitting: Internal Medicine

## 2019-10-02 VITALS — BP 142/86 | HR 89 | Ht 64.0 in | Wt 207.0 lb

## 2019-10-02 DIAGNOSIS — I639 Cerebral infarction, unspecified: Secondary | ICD-10-CM | POA: Diagnosis not present

## 2019-10-02 DIAGNOSIS — I10 Essential (primary) hypertension: Secondary | ICD-10-CM | POA: Diagnosis not present

## 2019-10-02 NOTE — Progress Notes (Signed)
HPI [name] returns today to have her ILR removed. She is a pleasant 69 yo woman with HTN and obesity who sustained a cryptogenic stroke several years ago. She has done well with no explanation for her stroke. She has reached RRT on her device. She presents for her device to be removed.  Allergies  Allergen Reactions  . Metformin And Related Nausea Only    "severe itching"     Current Outpatient Medications  Medication Sig Dispense Refill  . Blood Glucose Monitoring Suppl (ACCU-CHEK GUIDE) w/Device KIT 1 each by Does not apply route 2 (two) times daily. 1 kit 1  . cholecalciferol (VITAMIN D3) 25 MCG (1000 UT) tablet Take 1,000 Units by mouth daily.    Marland Kitchen CINNAMON PO Take 1 capsule by mouth daily.    . clopidogrel (PLAVIX) 75 MG tablet Take 1 tablet (75 mg total) by mouth daily. 20 tablet 0  . ELDERBERRY PO Take 1 capsule by mouth daily.    Marland Kitchen glipiZIDE (GLUCOTROL) 5 MG tablet TAKE 1 TABLET BY MOUTH TWICE DAILY BEFORE A MEAL 180 tablet 0  . glucose blood (ONETOUCH VERIO) test strip Check blood sugar two times a day 200 each 12  . hydrochlorothiazide (HYDRODIURIL) 12.5 MG tablet Take 1 tablet (12.5 mg total) by mouth daily. 30 tablet 2  . Lancet Device MISC For True Track meter - use to check blood sugars once a week. Dx code: 250.00. 100 each 11  . Nutritional Supplements (NUTRITIONAL SUPPLEMENT PO) Take 1 tablet by mouth daily. High Blood Sugar.    Glory Rosebush Delica Lancets 59F MISC Check blood sugar two times a day 200 each 3  . OVER THE COUNTER MEDICATION Take 0.5 drops by mouth daily. Silver supplement    . OVER THE COUNTER MEDICATION Take 1 capsule by mouth daily. Coconut supplement    . rosuvastatin (CRESTOR) 40 MG tablet Take 1 tablet (40 mg total) by mouth daily at 6 PM. 30 tablet 0   Current Facility-Administered Medications  Medication Dose Route Frequency Provider Last Rate Last Admin  . methylPREDNISolone acetate (DEPO-MEDROL) injection 40 mg  40 mg Intra-articular  Once Eunice Blase, MD         Past Medical History:  Diagnosis Date  . Diabetes mellitus   . Hypertension   . Sarcoidosis   . Stroke ([place])     ROS:   All systems reviewed and negative except as noted in the HPI.   Past Surgical History:  Procedure Laterality Date  . EP IMPLANTABLE DEVICE N/A 09/29/2015   Procedure: Loop Recorder Insertion;  Surgeon: Evans Lance, MD;  Location: [facility];  Service: Cardiovascular;  Laterality: N/A;  . INTRAOCULAR LENS INSERTION    . TEE WITHOUT CARDIOVERSION N/A 09/29/2015   Procedure: TRANSESOPHAGEAL ECHOCARDIOGRAM (TEE);  Surgeon: Larey Dresser, MD;  Location: [facility] END[facility];  Service: Cardiovascular;  Laterality: N/A;     Family History  Problem Relation Age of Onset  . Hypertension Son      Social History   Socioeconomic History  . Marital status: Married    Spouse name: Not on file  . Number of children: Not on file  . Years of education: Not on file  . Highest education level: Not on file  Occupational History  . Not on file  Tobacco Use  . Smoking status: Former Smoker    Quit date: 08/09/1976    Years since quitting: 43.1  . Smokeless tobacco: Never Used  Substance and Sexual Activity  . Alcohol use: No    Alcohol/week: 0.0 standard drinks  . Drug use: No  . Sexual activity: Not on file  Other Topics Concern  . Not on file  Social History Narrative  . Not on file   Social Determinants of Health   Financial Resource Strain:   . Difficulty of Paying Living Expenses:   Food Insecurity:   . Worried About Charity fundraiser in the Last Year:   . Arboriculturist in the Last Year:   Transportation Needs:   . Film/video editor (Medical):   Marland Kitchen Lack of Transportation (Non-Medical):   Physical Activity:   . Days of Exercise per Week:   . Minutes of Exercise per Session:   Stress:   . Feeling of Stress :   Social Connections:   . Frequency of Communication with Friends and Family:   . Frequency of  Social Gatherings with Friends and Family:   . Attends Religious Services:   . Active Member of Clubs or Organizations:   . Attends Archivist Meetings:   Marland Kitchen Marital Status:   Intimate Partner Violence:   . Fear of Current or Ex-Partner:   . Emotionally Abused:   Marland Kitchen Physically Abused:   . Sexually Abused:      BP (!) 142/86   Pulse 89   Ht _0  (1.626 m)   Wt 207 lb (93.9 kg)   SpO2 99%   BMI 35.53 kg/m   Physical Exam:  Well appearing NAD HEENT: Unremarkable Neck:  No JVD, no thyromegally Lymphatics:  No adenopathy Back:  No CVA tenderness Lungs:  Clear with no wheezes HEART:  Regular rate rhythm, no murmurs, no rubs, no clicks Abd:  soft, positive bowel sounds, no organomegally, no rebound, no guarding Ext:  2 plus pulses, no edema, no cyanosis, no clubbing Skin:  No rashes no nodules Neuro:  CN II through XII intact, motor grossly intact  DEVICE  Normal device function.  See PaceArt for details. RRT  Assess/Plan: 1. Cryptogenic stroke - the cause of her stroke is still a mystery. I have recommended shehave her ILR removed as she has reached RRT. 2. HTN - her bp is elevated. She was a bit anxious about her loop removal. She is encouraged to maintain a low sodium diet. 3. Obesity she is encouraged to lose weight.  Braxdon Gappa,M.D.  Addendum:  EP procedure Note:  Procedure performed: ILR removal.  Preoperative diagnosis: ILR insertio at RRT  Postoperative diagnosis:same as preoperative diagnosis  Description of the procedure: After informed consent was obtained, the patient was prepped and draped in a sterile fashion. 10cc of lidocaine was infiltrated into the left pectoral region. A one cm stab incision was carried out. Blunt and sharp dissection was utilized. The ILR was grasped with Georgina Peer clamps. The scalpel was used to cut away the fibrous scar tissue. The ILR was removed with gentle traction. Pressure was held. Benzoin and steri-strips were  painted on the skin. A bandage was applied. The patient was recovered in the usual manner.   Complications: none immediately  Conclusion: successful removal of an ILR in a patient with remote cryptogenic stroke whose device had reached RRT.  Mikle Bosworth.D.

## 2019-10-02 NOTE — Patient Instructions (Addendum)
Medication Instructions:  Your physician recommends that you continue on your current medications as directed. Please refer to the Current Medication list given to you today.  Labwork: None ordered.  Testing/Procedures: None ordered.  Follow-Up:  Your physician wants you to follow-up in: as needed with Dr. Lovena Le      Implantable Loop Recorder Placement, Care After Refer to this sheet in the next few weeks. These instructions provide you with information about caring for yourself after your procedure. Your health care provider may also give you more specific instructions. Your treatment has been planned according to current medical practices, but problems sometimes occur. Call your health care provider if you have any problems or questions after your procedure. What can I expect after the procedure? After the procedure, it is common to have:  Soreness or pain near the cut from surgery (incision).  Some swelling or bruising near the incision.  Follow these instructions at home:  Medicines  Take over-the-counter and prescription medicines only as told by your health care provider.  If you were prescribed an antibiotic medicine, take it as told by your health care provider. Do not stop taking the antibiotic even if you start to feel better.  Bathing Do not take baths, swim, or use a hot tub until your health care provider approves. You may shower 3 days after removal of your monitor.  Incision care  Follow instructions from your health care provider about how to take care of your incision. Make sure you: ? Remove your top dressing after 3 days (before you shower) ? Leave stitches (sutures), skin glue, or adhesive strips in place. These skin closures may need to stay in place for 2 weeks or longer. If adhesive strip edges start to loosen and curl up, you may trim the loose edges. Do not remove adhesive strips completely unless your health care provider tells you to do  that.  Check your incision area every day for signs of infection. Check for: ? More redness, swelling, or pain. ? Fluid or blood. ? Warmth. ? Pus or a bad smell.  Contact a health care provider if:  You have more redness, swelling, or pain around your incision.  You have more fluid or blood coming from your incision.  Your incision feels warm to the touch.  You have pus or a bad smell coming from your incision.  You have a fever.  You have pain that is not relieved by your pain medicine.  You have triggered your device because of fainting (syncope) or because of a heartbeat that feels like it is racing, slow, fluttering, or skipping (palpitations).

## 2019-10-03 DIAGNOSIS — M25762 Osteophyte, left knee: Secondary | ICD-10-CM | POA: Diagnosis not present

## 2019-10-03 DIAGNOSIS — M1712 Unilateral primary osteoarthritis, left knee: Secondary | ICD-10-CM | POA: Diagnosis not present

## 2019-10-03 DIAGNOSIS — M25562 Pain in left knee: Secondary | ICD-10-CM | POA: Diagnosis not present

## 2019-10-10 DIAGNOSIS — M25761 Osteophyte, right knee: Secondary | ICD-10-CM | POA: Diagnosis not present

## 2019-10-10 DIAGNOSIS — M25561 Pain in right knee: Secondary | ICD-10-CM | POA: Diagnosis not present

## 2019-10-10 DIAGNOSIS — M222X1 Patellofemoral disorders, right knee: Secondary | ICD-10-CM | POA: Diagnosis not present

## 2019-10-10 DIAGNOSIS — M1711 Unilateral primary osteoarthritis, right knee: Secondary | ICD-10-CM | POA: Diagnosis not present

## 2019-10-10 DIAGNOSIS — R2689 Other abnormalities of gait and mobility: Secondary | ICD-10-CM | POA: Diagnosis not present

## 2019-10-17 DIAGNOSIS — M1712 Unilateral primary osteoarthritis, left knee: Secondary | ICD-10-CM | POA: Diagnosis not present

## 2019-10-25 DIAGNOSIS — M1712 Unilateral primary osteoarthritis, left knee: Secondary | ICD-10-CM | POA: Diagnosis not present

## 2019-11-03 DIAGNOSIS — H6122 Impacted cerumen, left ear: Secondary | ICD-10-CM | POA: Diagnosis not present

## 2019-11-03 DIAGNOSIS — H65193 Other acute nonsuppurative otitis media, bilateral: Secondary | ICD-10-CM | POA: Diagnosis not present

## 2019-11-19 ENCOUNTER — Other Ambulatory Visit: Payer: Self-pay | Admitting: *Deleted

## 2019-11-19 DIAGNOSIS — I10 Essential (primary) hypertension: Secondary | ICD-10-CM

## 2019-11-19 MED ORDER — HYDROCHLOROTHIAZIDE 12.5 MG PO TABS: 12.5000 mg | ORAL_TABLET | Freq: Every day | ORAL | 1 refills | Status: DC

## 2020-02-11 ENCOUNTER — Encounter: Payer: Self-pay | Admitting: Internal Medicine

## 2020-02-11 ENCOUNTER — Other Ambulatory Visit: Payer: Self-pay

## 2020-02-11 ENCOUNTER — Ambulatory Visit (INDEPENDENT_AMBULATORY_CARE_PROVIDER_SITE_OTHER): Payer: Medicare Other | Admitting: Internal Medicine

## 2020-02-11 VITALS — BP 173/78 | HR 78 | Wt 202.6 lb

## 2020-02-11 DIAGNOSIS — D869 Sarcoidosis, unspecified: Secondary | ICD-10-CM | POA: Diagnosis not present

## 2020-02-11 DIAGNOSIS — I10 Essential (primary) hypertension: Secondary | ICD-10-CM | POA: Diagnosis not present

## 2020-02-11 DIAGNOSIS — Z0000 Encounter for general adult medical examination without abnormal findings: Secondary | ICD-10-CM

## 2020-02-11 DIAGNOSIS — Z9114 Patient's other noncompliance with medication regimen: Secondary | ICD-10-CM

## 2020-02-11 DIAGNOSIS — L819 Disorder of pigmentation, unspecified: Secondary | ICD-10-CM | POA: Diagnosis not present

## 2020-02-11 DIAGNOSIS — E1169 Type 2 diabetes mellitus with other specified complication: Secondary | ICD-10-CM | POA: Diagnosis not present

## 2020-02-11 DIAGNOSIS — E119 Type 2 diabetes mellitus without complications: Secondary | ICD-10-CM | POA: Diagnosis not present

## 2020-02-11 DIAGNOSIS — Z79899 Other long term (current) drug therapy: Secondary | ICD-10-CM

## 2020-02-11 DIAGNOSIS — Z8673 Personal history of transient ischemic attack (TIA), and cerebral infarction without residual deficits: Secondary | ICD-10-CM

## 2020-02-11 DIAGNOSIS — E118 Type 2 diabetes mellitus with unspecified complications: Secondary | ICD-10-CM

## 2020-02-11 DIAGNOSIS — I639 Cerebral infarction, unspecified: Secondary | ICD-10-CM

## 2020-02-11 DIAGNOSIS — Z0001 Encounter for general adult medical examination with abnormal findings: Secondary | ICD-10-CM | POA: Diagnosis not present

## 2020-02-11 DIAGNOSIS — Z1231 Encounter for screening mammogram for malignant neoplasm of breast: Secondary | ICD-10-CM

## 2020-02-11 LAB — GLUCOSE, CAPILLARY: Glucose-Capillary: 184 mg/dL — ABNORMAL HIGH (ref 70–99)

## 2020-02-11 LAB — POCT GLYCOSYLATED HEMOGLOBIN (HGB A1C): Hemoglobin A1C: 10.1 % — AB (ref 4.0–5.6)

## 2020-02-11 MED ORDER — ONETOUCH DELICA LANCETS 33G MISC: 3 refills | Status: DC

## 2020-02-11 MED ORDER — ASPIRIN EC 81 MG PO TBEC: 81.0000 mg | DELAYED_RELEASE_TABLET | Freq: Every day | ORAL | 5 refills | Status: AC

## 2020-02-11 MED ORDER — HYDROCHLOROTHIAZIDE 25 MG PO TABS: 25.0000 mg | ORAL_TABLET | Freq: Every day | ORAL | 5 refills | Status: DC

## 2020-02-11 MED ORDER — ONETOUCH VERIO VI STRP: ORAL_STRIP | 12 refills | Status: DC

## 2020-02-11 MED ORDER — GLIPIZIDE 5 MG PO TABS: ORAL_TABLET | ORAL | 2 refills | Status: DC

## 2020-02-11 NOTE — Patient Instructions (Signed)
Ms. ALEAYA LATONA,  It was a pleasure to see you today. Thank you for coming in.   Today we discussed your diabetes. Your A1c is really high today, it's 10. Please restart taking the glipizide twice a day. Please continue working on your diet and exercise to bring down your blood sugars. Please start checking your blood sugars daily and bring in your meter on your next visit.    We also discussed your blood pressure. This was really elevated today too. Please increase your hydrochlorothiazide to 25 mg daily. Please work on diet and exercise to try and bring this down to.   Please start taking the aspirin daily again. We also discussed your cholesterol, this is elevated too. We would recommend a statin medication or the Zetia. Please consider restarting either of these medications.   Please return to clinic in 1 month or sooner if needed.   Thank you again for coming in.   Asencion Noble.D.

## 2020-02-11 NOTE — Progress Notes (Signed)
   CC: HTN, DM, and history of cryptogenic stroke  HPI:  Wendy [name] is a 69 y.o. with history of hypertension, diabetes, sarcoidosis, and CVA presenting for follow-up of her hypertension, diabetes, and history of cryptogenic stroke.  Past Medical History:  Diagnosis Date  . Diabetes mellitus   . Hypertension   . Sarcoidosis   . Stroke [facility])    Review of Systems:   Constitutional: Negative for chills and fever.  Respiratory: Negative for shortness of breath.   Cardiovascular: Negative for chest pain and leg swelling.  Gastrointestinal: Negative for abdominal pain, nausea and vomiting.  Neurological: Negative for dizziness and headaches.   Physical Exam:  Vitals:   02/11/20 1558  BP: (!) 187/92  Pulse: 76  SpO2: 100%  Weight: 202 lb 9.6 oz (91.9 kg)   Physical Exam Constitutional:      Appearance: Normal appearance.  Cardiovascular:     Rate and Rhythm: Normal rate and regular rhythm.     Pulses: Normal pulses.     Heart sounds: Normal heart sounds.  Pulmonary:     Effort: Pulmonary effort is normal.     Breath sounds: Normal breath sounds.  Abdominal:     General: Abdomen is flat. Bowel sounds are normal.     Palpations: Abdomen is soft.  Skin:    General: Skin is warm and dry.     Capillary Refill: Capillary refill takes less than 2 seconds.  Neurological:     General: No focal deficit present.     Mental Status: She is alert and oriented to person, place, and time.  Psychiatric:        Mood and Affect: Mood normal.        Behavior: Behavior normal.      Assessment & Plan:   See Encounters Tab for problem based charting.  Patient discussed with Dr. Rebeca Alert

## 2020-02-12 LAB — BMP8+ANION GAP
Anion Gap: 14 mmol/L (ref 10.0–18.0)
BUN/Creatinine Ratio: 12 (ref 12–28)
BUN: 15 mg/dL (ref 8–27)
CO2: 25 mmol/L (ref 20–29)
Calcium: 9.8 mg/dL (ref 8.7–10.3)
Chloride: 100 mmol/L (ref 96–106)
Creatinine, Ser: 1.28 mg/dL — ABNORMAL HIGH (ref 0.57–1.00)
GFR calc Af Amer: 49 mL/min/{1.73_m2} — ABNORMAL LOW (ref >59)
GFR calc non Af Amer: 43 mL/min/{1.73_m2} — ABNORMAL LOW (ref >59)
Glucose: 182 mg/dL — ABNORMAL HIGH (ref 65–99)
Potassium: 4.3 mmol/L (ref 3.5–5.2)
Sodium: 139 mmol/L (ref 134–144)

## 2020-02-12 LAB — MICROALBUMIN / CREATININE URINE RATIO
Creatinine, Urine: 92.6 mg/dL
Microalb/Creat Ratio: 132 mg/g creat — ABNORMAL HIGH (ref 0–29)
Microalbumin, Urine: 122.3 ug/mL

## 2020-02-12 LAB — HEPATITIS C ANTIBODY: Hep C Virus Ab: <0.1 s/co ratio (ref 0.0–0.9)

## 2020-02-12 NOTE — Assessment & Plan Note (Signed)
Patient is currently on HCTZ 12.5 mg daily.  Blood pressure today is elevated at 187/92, repeat was 173/78.  She denies any headaches, lightheadedness, chest pain, shortness of breath, blurry vision, or other symptoms at this time.  She denies any issues taking her medication.  Diabetes is well controlled at this time.  She does have history of CKD stage III.  We will increase her hydrochlorothiazide today and check labs. -Increase HCTZ to 25 mg daily -Repeat BMP today -RTC in 1 month -Consider addition of lisinopril if kidney function stable

## 2020-02-12 NOTE — Progress Notes (Signed)
Internal Medicine Clinic Attending  Case discussed with Dr. Krienke at the time of the visit.  We reviewed the resident's history and exam and pertinent patient test results.  I agree with the assessment, diagnosis, and plan of care documented in the resident's note.  Blaise Grieshaber, M.D., Ph.D.  

## 2020-02-12 NOTE — Assessment & Plan Note (Signed)
Patient is supposed to be on glipizide 5 mg twice daily.  She reports that she has stopped taking her medications because she thought she did not need any medications.  She also reports that she has been eating whatever she wants.  She states that she has not been checking her blood sugars.  She denies any polyuria, polydipsia, abdominal pain, nausea, fevers, chills, lightheadedness, dizziness, fatigue, weakness, or other symptoms.  Her A1c today is elevated at 10.  We discussed the importance of better control of her diabetes.  Discussed the risks including cardiac issues, strokes, neuropathies, kidney injury, vision changes, and other complications.  She reported understanding of the importance of improved diabetes control.  She reported that she wanted to work on her diet and exercise and that we can restart her on the glipizide.  Discussed that diet and glipizide alone likely will not get her to goal and that we should consider additional medications.  She declined additional medications at this time.  Discussed that we will need to have close follow-up to ensure that her CBGs have been better controlled. -Restart glipizide 5 mg twice daily -Check urine microalbumin/creatinine -Patient will work on dietary changes -Provided a glucometer, advised to check at least daily -RTC in 1 month to discuss potential need for additional medications

## 2020-02-12 NOTE — Assessment & Plan Note (Signed)
Patient has a history of a cryptogenic stroke in 10/2018 that caused speech difficulties, no residual symptoms at this time.  Work-up for the cause was unremarkable.  She had been on aspirin and Plavix however was decreased to aspirin daily.  She reports that she has not been taking the aspirin.  She also has been switched from Crestor because patient did not want to take this medication and previously been trialed on Zetia however patient states that she is not taking this medication either.  We discussed the side effects of these medications and encouraged use to decrease the risk of additional strokes.  She expressed understanding and states that she will think about it.  She is agreeable to restarting the aspirin.  -Restart ASA -We will need continued discussion of treatment for her uncontrolled hypertension, uncontrolled diabetes, and uncontrolled hyperlipidemia

## 2020-02-25 ENCOUNTER — Ambulatory Visit (INDEPENDENT_AMBULATORY_CARE_PROVIDER_SITE_OTHER): Payer: Medicare Other | Admitting: Internal Medicine

## 2020-02-25 ENCOUNTER — Encounter: Payer: Self-pay | Admitting: Dietician

## 2020-02-25 ENCOUNTER — Other Ambulatory Visit: Payer: Self-pay

## 2020-02-25 ENCOUNTER — Encounter: Payer: Self-pay | Admitting: Internal Medicine

## 2020-02-25 ENCOUNTER — Encounter: Payer: Medicare Other | Admitting: Internal Medicine

## 2020-02-25 DIAGNOSIS — K068 Other specified disorders of gingiva and edentulous alveolar ridge: Secondary | ICD-10-CM | POA: Insufficient documentation

## 2020-02-25 DIAGNOSIS — E1165 Type 2 diabetes mellitus with hyperglycemia: Secondary | ICD-10-CM | POA: Diagnosis not present

## 2020-02-25 DIAGNOSIS — I10 Essential (primary) hypertension: Secondary | ICD-10-CM

## 2020-02-25 HISTORY — DX: Other specified disorders of gingiva and edentulous alveolar ridge: K06.8

## 2020-02-25 MED ORDER — LISINOPRIL 10 MG PO TABS: 10.0000 mg | ORAL_TABLET | Freq: Every day | ORAL | 3 refills | Status: DC

## 2020-02-25 NOTE — Assessment & Plan Note (Signed)
Patient presents to the clinic with gum pain. She states that the pain has been ongoing for the last several months, after her dentist pulled approximately 5 teeth and one of her wisdom teeth. She has been following with the dentist and is scheduled on 02/26/20 to be reevaluated, and for her previous sutures to be removed. On physical examination, patient's gums do not appear to be inflamed, ulcerated, or having signs of dehiscence. Patietn does not have difficulty swallowing, or clearing secretions at this time.  - Follow up with Dentist appointment on 9/14

## 2020-02-25 NOTE — Progress Notes (Signed)
   CC: gum pain  HPI:  [name] is a 69 y.o. female, with a pmh noted below, who presents to the clinic for gum pain. To see the management of her acute and chronic conditions, please see the A&P note under the encounters tab.   Past Medical History:  Diagnosis Date  . Diabetes mellitus   . Hypertension   . Sarcoidosis   . Stroke [facility])    Review of Systems:   Review of Systems  Constitutional: Negative for chills, fever, malaise/fatigue and weight loss.  Eyes: Negative for double vision, photophobia, pain and discharge.  Respiratory: Negative for sputum production, shortness of breath and wheezing.   Cardiovascular: Negative for chest pain, palpitations, orthopnea, claudication and leg swelling.  Gastrointestinal: Negative for abdominal pain, blood in stool, constipation, diarrhea, nausea and vomiting.  Musculoskeletal: Negative for back pain, joint pain and neck pain.  Neurological: Negative for dizziness, tingling, tremors and headaches.     Physical Exam:  Vitals:   02/25/20 1527 02/25/20 1536  BP: (!) 167/82 (!) 161/74  Pulse: 81 75  Temp: 98.6 F (37 C)   TempSrc: Oral   SpO2: 99%   Weight: 202 lb 9.6 oz (91.9 kg)   Height: 5\' 4"  (1.626 m)    Physical Exam Vitals reviewed.  Constitutional:      General: She is not in acute distress.    Appearance: Normal appearance. She is not ill-appearing or toxic-appearing.  HENT:     Head: Normocephalic and atraumatic.     Mouth/Throat:     Comments: Moist mucus membranes, no errythema, ulcerations, or purulence noted on examination.  Eyes:     General:        Right eye: No discharge.        Left eye: No discharge.     Conjunctiva/sclera: Conjunctivae normal.  Cardiovascular:     Rate and Rhythm: Normal rate and regular rhythm.     Pulses: Normal pulses.     Heart sounds: Normal heart sounds. No murmur heard.  No friction rub. No gallop.   Pulmonary:     Effort: Pulmonary effort is normal.     Breath  sounds: Normal breath sounds. No wheezing, rhonchi or rales.  Abdominal:     General: Bowel sounds are normal.     Palpations: Abdomen is soft.     Tenderness: There is no abdominal tenderness. There is no guarding.  Musculoskeletal:        General: No swelling.     Right lower leg: No edema.     Left lower leg: No edema.  Neurological:     General: No focal deficit present.     Mental Status: She is alert and oriented to person, place, and time.      Assessment & Plan:   See Encounters Tab for problem based charting.  Patient discussed with Dr. Rebeca Alert

## 2020-02-25 NOTE — Assessment & Plan Note (Signed)
BP Readings from Last 3 Encounters:  02/25/20 (!) 161/74  02/11/20 (!) 173/78  10/02/19 (!) 142/86  Patient presents with BP noted above currently taking HCTZ 25 mg daily. She states that she is compliant with her medications at home, and denies any side effect from her medication. She does currently have uncontrolled diabetes with a A1c of 10.1 on her last visit. Given her diabetes and uncontrolled BP, in tandem with her CKD, will start on lisinopril today.  - Continue HCTZ 25 mg daily - Start Lisinopril 10 mg daily

## 2020-02-25 NOTE — Patient Instructions (Signed)
Ms. Wendy [name],  It was a pleasure meeting you today. Today we discussed your tooth pain, hypertension, and diabetes. For your tooth pain, please follow up with your dental appointment tomorrow. For your blood pressure continue to take your hydrochlorothiazide, we will also start you on lisinopril to help control your blood pressure and protect your kidney. We will have you follow up in 4-6 weeks for a blood pressure check. Have a good day!  Sincerely,  Maudie Mercury, MD

## 2020-02-26 ENCOUNTER — Other Ambulatory Visit: Payer: Self-pay | Admitting: Dietician

## 2020-02-26 DIAGNOSIS — E1169 Type 2 diabetes mellitus with other specified complication: Secondary | ICD-10-CM

## 2020-02-26 MED ORDER — ACCU-CHEK GUIDE VI STRP: ORAL_STRIP | 3 refills | Status: DC

## 2020-02-26 MED ORDER — ACCU-CHEK SOFTCLIX LANCETS MISC: 3 refills | Status: DC

## 2020-02-26 NOTE — Progress Notes (Signed)
Internal Medicine Clinic Attending  Case discussed with Dr. Winters at the time of the visit.  We reviewed the resident's history and exam and pertinent patient test results.  I agree with the assessment, diagnosis, and plan of care documented in the resident's note.  Laveta Gilkey, M.D., Ph.D.  

## 2020-02-26 NOTE — Telephone Encounter (Signed)
She requested supplies for the Accu chek Guideme meter. She was educated that if the cost is too much for the 90 day supply she can request a 30 day supply.

## 2020-02-26 NOTE — Progress Notes (Signed)
Was asked by patient for help in learning how to use her Accu chek Guideme meter. She was able to check her won blood sugar after instruction/reveiw. She verbalized increased self confidence in being abel to do this on her won at home and requested supplies be sent to [facility].

## 2020-02-28 ENCOUNTER — Telehealth: Payer: Self-pay

## 2020-02-28 NOTE — Telephone Encounter (Signed)
Received TC from patient.  States the [facility]ril was [facility] to [facility] and she would like it sent to [facility] on [address].  Informed patient she can have the RX transferred to any pharmacy of choice, however, patient was confused on doing this.  RN called [facility] and had RX transferred per patient request.  [facility] removed from pharmacy list. Laurence Compton, RN,BSN

## 2020-03-19 ENCOUNTER — Ambulatory Visit (INDEPENDENT_AMBULATORY_CARE_PROVIDER_SITE_OTHER): Payer: Medicare Other | Admitting: Internal Medicine

## 2020-03-19 ENCOUNTER — Encounter: Payer: Self-pay | Admitting: Internal Medicine

## 2020-03-19 ENCOUNTER — Telehealth: Payer: Self-pay | Admitting: *Deleted

## 2020-03-19 VITALS — BP 159/81 | HR 84 | Temp 98.4°F | Wt 201.2 lb

## 2020-03-19 DIAGNOSIS — M1990 Unspecified osteoarthritis, unspecified site: Secondary | ICD-10-CM

## 2020-03-19 DIAGNOSIS — Z87891 Personal history of nicotine dependence: Secondary | ICD-10-CM | POA: Diagnosis not present

## 2020-03-19 DIAGNOSIS — I639 Cerebral infarction, unspecified: Secondary | ICD-10-CM

## 2020-03-19 DIAGNOSIS — M1712 Unilateral primary osteoarthritis, left knee: Secondary | ICD-10-CM | POA: Diagnosis not present

## 2020-03-19 DIAGNOSIS — E1169 Type 2 diabetes mellitus with other specified complication: Secondary | ICD-10-CM | POA: Diagnosis not present

## 2020-03-19 LAB — GLUCOSE, CAPILLARY
Glucose-Capillary: 70 mg/dL (ref 70–99)
Glucose-Capillary: 105 mg/dL — ABNORMAL HIGH (ref 70–99)

## 2020-03-19 NOTE — Patient Instructions (Signed)
Thank you, [name] for allowing [place] to provide your care today. Today we discussed low blood sugar.    I have ordered the following labs for you:   Lab Orders     Glucose, capillary     Glucose, capillary   I will call if any are abnormal. All of your labs can be accessed through "My Chart".  I have place a referrals to none.  I have ordered the following tests: none   I have ordered the following medication/changed the following medications:  1. discontinue Glipizide 2. begin Ensure (meal replacement shakes)  Please follow-up in 1 weeks.  Should you have any questions or concerns please call the internal medicine clinic at [phone].    Wendy [name], D.O. [facility]   My Chart Access: [email or web address]   If you have not already done so, please get your COVID 19 vaccine  To schedule an appointment for a COVID vaccine choice any of the following: Go to [email or web address]   Go to [email or web address]                  Call [phone]                                     Call [phone] and select Option 2

## 2020-03-19 NOTE — Telephone Encounter (Signed)
Call from pt stating she has a dental appt on Friday so she will be able to come with her husband on Friday who has an appt; requesting to be seen today instead. Also stated she wants to be seen today also b/c of her fluctuating blood sugars. Stated she has already called/talked to her son who agreed to bring them. Her appt scheduled @ 1445PM with Dr Marianna Payment and Mr Petsch' appt  Scheduled @ 1515PM  With Dr Truman Hayward today- she stated it should only take 15 mins to get here.

## 2020-03-19 NOTE — Progress Notes (Signed)
CC: DM  HPI:  Wendy [name] is a 69 y.o. female with a past medical history stated below and presents today for DM. Please see problem based assessment and plan for additional details.  Past Medical History:  Diagnosis Date  . Diabetes mellitus   . Hypertension   . Sarcoidosis   . Stroke [facility])     Current Outpatient Medications on File Prior to Visit  Medication Sig Dispense Refill  . Accu-Chek Softclix Lancets lancets Check blood sugar up to 1 time a day as instructed 100 each 3  . aspirin EC 81 MG tablet Take 1 tablet (81 mg total) by mouth daily. Swallow whole. 30 tablet 5  . Blood Glucose Monitoring Suppl (ACCU-CHEK GUIDE) w/Device KIT 1 each by Does not apply route 2 (two) times daily. 1 kit 1  . cholecalciferol (VITAMIN D3) 25 MCG (1000 UT) tablet Take 1,000 Units by mouth daily.    Marland Kitchen CINNAMON PO Take 1 capsule by mouth daily.    Marland Kitchen ELDERBERRY PO Take 1 capsule by mouth daily.    Marland Kitchen glipiZIDE (GLUCOTROL) 5 MG tablet TAKE 1 TABLET BY MOUTH TWICE DAILY BEFORE A MEAL 60 tablet 2  . glucose blood (ACCU-CHEK GUIDE) test strip Check blood sugar up to 1 time per day as instructed 100 each 3  . hydrochlorothiazide (HYDRODIURIL) 25 MG tablet Take 1 tablet (25 mg total) by mouth daily. 30 tablet 5  . Lancet Device MISC For True Track meter - use to check blood sugars once a week. Dx code: 250.00. 100 each 11  . lisinopril (ZESTRIL) 10 MG tablet Take 1 tablet (10 mg total) by mouth daily. 90 tablet 3  . Nutritional Supplements (NUTRITIONAL SUPPLEMENT PO) Take 1 tablet by mouth daily. High Blood Sugar.    Marland Kitchen OVER THE COUNTER MEDICATION Take 0.5 drops by mouth daily. Silver supplement    . OVER THE COUNTER MEDICATION Take 1 capsule by mouth daily. Coconut supplement     No current facility-administered medications on file prior to visit.    Family History  Problem Relation Age of Onset  . Hypertension Son     Social History   Socioeconomic History  . Marital status:  Married    Spouse name: Not on file  . Number of children: Not on file  . Years of education: Not on file  . Highest education level: Not on file  Occupational History  . Not on file  Tobacco Use  . Smoking status: Former Smoker    Quit date: 08/09/1976    Years since quitting: 43.6  . Smokeless tobacco: Never Used  Substance and Sexual Activity  . Alcohol use: No    Alcohol/week: 0.0 standard drinks  . Drug use: No  . Sexual activity: Not on file  Other Topics Concern  . Not on file  Social History Narrative  . Not on file   Social Determinants of Health   Financial Resource Strain:   . Difficulty of Paying Living Expenses: Not on file  Food Insecurity:   . Worried About Charity fundraiser in the Last Year: Not on file  . Ran Out of Food in the Last Year: Not on file  Transportation Needs:   . Lack of Transportation (Medical): Not on file  . Lack of Transportation (Non-Medical): Not on file  Physical Activity:   . Days of Exercise per Week: Not on file  . Minutes of Exercise per Session: Not on file  Stress:   .  Feeling of Stress : Not on file  Social Connections:   . Frequency of Communication with Friends and Family: Not on file  . Frequency of Social Gatherings with Friends and Family: Not on file  . Attends Religious Services: Not on file  . Active Member of Clubs or Organizations: Not on file  . Attends Archivist Meetings: Not on file  . Marital Status: Not on file  Intimate Partner Violence:   . Fear of Current or Ex-Partner: Not on file  . Emotionally Abused: Not on file  . Physically Abused: Not on file  . Sexually Abused: Not on file    Review of Systems: ROS negative except for what is noted on the assessment and plan.  Vitals:   03/19/20 1526  BP: (!) 159/81  Pulse: 84  Temp: 98.4 F (36.9 C)  TempSrc: Oral  SpO2: 100%  Weight: 201 lb 3.2 oz (91.3 kg)     Physical Exam: Physical Exam Constitutional:      Appearance: Normal  appearance.  HENT:     Head: Normocephalic and atraumatic.  Cardiovascular:     Rate and Rhythm: Normal rate.     Pulses: Normal pulses.     Heart sounds: Normal heart sounds.  Pulmonary:     Effort: Pulmonary effort is normal.     Breath sounds: Normal breath sounds.  Musculoskeletal:        General: Normal range of motion.  Skin:    General: Skin is warm and dry.  Neurological:     General: No focal deficit present.     Mental Status: She is alert and oriented to person, place, and time.     Cranial Nerves: No cranial nerve deficit.     Sensory: No sensory deficit.     Coordination: Coordination normal.     Deep Tendon Reflexes: Reflexes normal.     Comments: Sleepy appearing but answering questions appropriately with good insight. She was otherwise, neurovascularly intact.       Assessment & Plan:   See Encounters Tab for problem based charting.  Patient discussed with Dr. Karie Schwalbe, D.O. [facility], PGY-2 Pager: [phone], Phone: [phone] Date 03/20/2020 Time 8:23 PM

## 2020-03-20 ENCOUNTER — Encounter: Payer: Self-pay | Admitting: Internal Medicine

## 2020-03-20 MED ORDER — ACCU-CHEK SOFTCLIX LANCETS MISC: 3 refills | Status: DC

## 2020-03-20 MED ORDER — ACCU-CHEK GUIDE VI STRP: ORAL_STRIP | 3 refills | Status: DC

## 2020-03-20 MED ORDER — LANCET DEVICE MISC: 11 refills | Status: DC

## 2020-03-20 NOTE — Assessment & Plan Note (Signed)
Patient presents with a 24 hour history of fatigue, lightheadedness, shakiness, and sleepiness. She recently had dental work done over the last week and has had a difficult time with PO intake. During this time she has continued to take glipizide as prescribed. She recently ran out of her glucometer test strips and has been unable to check her blood sugar since last Wednesday.   On arrival to the clinic, she continue to have these symptoms and her blood sugar was in the 70's. She was given some juice and crackers, which improved her symptoms. Patient was instructed to hold off on taking her Glipizide for the remainder of this week and follow up with me in 1 week to re-evaluate her other comorbidities. I counseled her on get Ensure meal replacement drinks in the mean time to help keep her blood sugar elevated and assist with supplemental nutrition.   Plan: - Refill glucometer test strips - Hold glipizide for 1 week - Re-evaluate in 1 week.

## 2020-03-21 NOTE — Progress Notes (Signed)
Internal Medicine Clinic Attending  Case discussed with Dr. Coe  At the time of the visit.  We reviewed the resident's history and exam and pertinent patient test results.  I agree with the assessment, diagnosis, and plan of care documented in the resident's note.  

## 2020-03-21 NOTE — Addendum Note (Signed)
Addended by: Lawerance Cruel on: 03/21/2020 09:08 AM   Modules accepted: Orders

## 2020-03-21 NOTE — Assessment & Plan Note (Signed)
Patient continues to have pain in her left knee secondary to osteoarthritis.  Patient requires a cane for ambulation but continues to feel unstable on her feet.  She also has a history of CVA that likely contributes to this.  Patient believes that her mobility limits her current daily activities and functional status.  Therefore, I will order a rolling walker with seat so that she can continue to perform her ADLs and IADLs safely.  Plan: DME rolling walker with seat.

## 2020-03-24 ENCOUNTER — Encounter: Payer: Self-pay | Admitting: Internal Medicine

## 2020-03-24 ENCOUNTER — Ambulatory Visit (INDEPENDENT_AMBULATORY_CARE_PROVIDER_SITE_OTHER): Payer: Medicare Other | Admitting: Internal Medicine

## 2020-03-24 VITALS — BP 154/76 | HR 77 | Temp 98.1°F | Ht 64.0 in | Wt 200.9 lb

## 2020-03-24 DIAGNOSIS — E785 Hyperlipidemia, unspecified: Secondary | ICD-10-CM

## 2020-03-24 DIAGNOSIS — N1832 Chronic kidney disease, stage 3b: Secondary | ICD-10-CM

## 2020-03-24 DIAGNOSIS — I129 Hypertensive chronic kidney disease with stage 1 through stage 4 chronic kidney disease, or unspecified chronic kidney disease: Secondary | ICD-10-CM

## 2020-03-24 DIAGNOSIS — E1122 Type 2 diabetes mellitus with diabetic chronic kidney disease: Secondary | ICD-10-CM

## 2020-03-24 DIAGNOSIS — I10 Essential (primary) hypertension: Secondary | ICD-10-CM

## 2020-03-24 DIAGNOSIS — Z0001 Encounter for general adult medical examination with abnormal findings: Secondary | ICD-10-CM

## 2020-03-24 DIAGNOSIS — Z8673 Personal history of transient ischemic attack (TIA), and cerebral infarction without residual deficits: Secondary | ICD-10-CM | POA: Diagnosis not present

## 2020-03-24 DIAGNOSIS — E1169 Type 2 diabetes mellitus with other specified complication: Secondary | ICD-10-CM | POA: Diagnosis not present

## 2020-03-24 DIAGNOSIS — E1165 Type 2 diabetes mellitus with hyperglycemia: Secondary | ICD-10-CM | POA: Diagnosis not present

## 2020-03-24 DIAGNOSIS — E782 Mixed hyperlipidemia: Secondary | ICD-10-CM

## 2020-03-24 DIAGNOSIS — Z0000 Encounter for general adult medical examination without abnormal findings: Secondary | ICD-10-CM

## 2020-03-24 NOTE — Patient Instructions (Signed)
Thank you, [name] for allowing [place] to provide your care today. Today we discussed Diabetes, blood pressure.    I have ordered the following labs for you:  Lab Orders  No laboratory test(s) ordered today     I will call if any are abnormal. All of your labs can be accessed through "My Chart".  I have place a referrals to none.  I have ordered the following tests: none   I have ordered the following medication/changed the following medications:  1. continue all of your current medicaitons.  2. I will start you on a new diabetes medicaiton today.   Please follow-up 2 months.    Should you have any questions or concerns please call the internal medicine clinic at [phone].    Marianna Payment, D.O. [place]

## 2020-03-24 NOTE — Progress Notes (Signed)
CC: HTN   HPI:  Wendy [name] is a 69 y.o. female with a past medical history stated below and presents today for HTN. Please see problem based assessment and plan for additional details.  Past Medical History:  Diagnosis Date  . Acute right ankle pain 05/25/2018  . Diabetes mellitus   . Hypertension   . Pain in gums 02/25/2020  . Peroneal tendinitis, right leg 05/30/2018  . Pigmented skin lesion of uncertain nature 11/18/6193   Ms. Sponsel reports a 3 mo hx of a skin lesion, centrally located below her breasts. She is concerned because her sister developed breast cancer after noticing a spot on her breast. Patient reports no bleeding or itching. She states it is not getting progressively larger. She states she accidentally scraped it one day which was painful. She has never had anything like this before. She denies a fami  . Sarcoidosis   . Screening mammogram, encounter for 06/29/2012  . Stroke [facility])     Current Outpatient Medications on File Prior to Visit  Medication Sig Dispense Refill  . Accu-Chek Softclix Lancets lancets Check blood sugar up to 1 time a day as instructed 100 each 3  . aspirin EC 81 MG tablet Take 1 tablet (81 mg total) by mouth daily. Swallow whole. 30 tablet 5  . Blood Glucose Monitoring Suppl (ACCU-CHEK GUIDE) w/Device KIT 1 each by Does not apply route 2 (two) times daily. 1 kit 1  . cholecalciferol (VITAMIN D3) 25 MCG (1000 UT) tablet Take 1,000 Units by mouth daily.    Marland Kitchen CINNAMON PO Take 1 capsule by mouth daily.    Marland Kitchen ELDERBERRY PO Take 1 capsule by mouth daily.    Marland Kitchen glucose blood (ACCU-CHEK GUIDE) test strip Check blood sugar up to 1 time per day as instructed 100 each 3  . hydrochlorothiazide (HYDRODIURIL) 25 MG tablet Take 1 tablet (25 mg total) by mouth daily. 30 tablet 5  . Lancet Device MISC For True Track meter - use to check blood sugars once a week. Dx code: 250.00. 100 each 11  . lisinopril (ZESTRIL) 10 MG tablet Take 1 tablet (10 mg total) by  mouth daily. 90 tablet 3  . Nutritional Supplements (NUTRITIONAL SUPPLEMENT PO) Take 1 tablet by mouth daily. High Blood Sugar.    Marland Kitchen OVER THE COUNTER MEDICATION Take 0.5 drops by mouth daily. Silver supplement    . OVER THE COUNTER MEDICATION Take 1 capsule by mouth daily. Coconut supplement     No current facility-administered medications on file prior to visit.    Family History  Problem Relation Age of Onset  . Hypertension Son     Social History   Socioeconomic History  . Marital status: Married    Spouse name: Not on file  . Number of children: Not on file  . Years of education: Not on file  . Highest education level: Not on file  Occupational History  . Not on file  Tobacco Use  . Smoking status: Former Smoker    Quit date: 08/09/1976    Years since quitting: 43.6  . Smokeless tobacco: Never Used  Substance and Sexual Activity  . Alcohol use: No    Alcohol/week: 0.0 standard drinks  . Drug use: No  . Sexual activity: Not on file  Other Topics Concern  . Not on file  Social History Narrative  . Not on file   Social Determinants of Health   Financial Resource Strain:   . Difficulty of Paying  Living Expenses: Not on file  Food Insecurity:   . Worried About Charity fundraiser in the Last Year: Not on file  . Ran Out of Food in the Last Year: Not on file  Transportation Needs:   . Lack of Transportation (Medical): Not on file  . Lack of Transportation (Non-Medical): Not on file  Physical Activity:   . Days of Exercise per Week: Not on file  . Minutes of Exercise per Session: Not on file  Stress:   . Feeling of Stress : Not on file  Social Connections:   . Frequency of Communication with Friends and Family: Not on file  . Frequency of Social Gatherings with Friends and Family: Not on file  . Attends Religious Services: Not on file  . Active Member of Clubs or Organizations: Not on file  . Attends Archivist Meetings: Not on file  . Marital Status:  Not on file  Intimate Partner Violence:   . Fear of Current or Ex-Partner: Not on file  . Emotionally Abused: Not on file  . Physically Abused: Not on file  . Sexually Abused: Not on file    Review of Systems: ROS negative except for what is noted on the assessment and plan.  Vitals:   03/24/20 1513  BP: (!) 154/76  Pulse: 77  Temp: 98.1 F (36.7 C)  TempSrc: Oral  SpO2: 100%  Weight: 200 lb 14.4 oz (91.1 kg)  Height: 5' 4"  (1.626 m)     Physical Exam: Physical Exam Constitutional:      Appearance: Normal appearance.  HENT:     Head: Normocephalic and atraumatic.  Cardiovascular:     Rate and Rhythm: Normal rate.     Pulses: Normal pulses.     Heart sounds: Normal heart sounds.  Pulmonary:     Effort: Pulmonary effort is normal.     Breath sounds: Normal breath sounds.  Musculoskeletal:        General: Normal range of motion.     Cervical back: Normal range of motion.     Right lower leg: No edema.     Left lower leg: No edema.  Skin:    General: Skin is warm and dry.  Neurological:     Mental Status: She is alert and oriented to person, place, and time. Mental status is at baseline.  Psychiatric:        Mood and Affect: Mood normal.      Assessment & Plan:   See Encounters Tab for problem based charting.  Patient discussed with Dr. Karie Schwalbe, D.O. [facility], PGY-2 Pager: [phone], Phone: [phone] Date 03/25/2020 Time 1:42 PM

## 2020-03-25 ENCOUNTER — Telehealth: Payer: Self-pay | Admitting: Dietician

## 2020-03-25 ENCOUNTER — Encounter: Payer: Self-pay | Admitting: Internal Medicine

## 2020-03-25 MED ORDER — ACCU-CHEK GUIDE VI STRP: ORAL_STRIP | 3 refills | Status: DC

## 2020-03-25 MED ORDER — ACCU-CHEK SOFTCLIX LANCETS MISC: 3 refills | Status: DC

## 2020-03-25 NOTE — Assessment & Plan Note (Signed)
Offered Covid vaccine, pneumonia and flu vaccines today.  She states that she does not want to get these vaccines today but will consider it for the future.  Furthermore patient does not want to get a colonoscopy at this time either.

## 2020-03-25 NOTE — Progress Notes (Signed)
Internal Medicine Clinic Attending  Case discussed with Dr. Coe  At the time of the visit.  We reviewed the resident's history and exam and pertinent patient test results.  I agree with the assessment, diagnosis, and plan of care documented in the resident's note.  

## 2020-03-25 NOTE — Assessment & Plan Note (Signed)
Patient presents for a follow-up for her essential hypertension.  Blood pressure is 154/76 on lisinopril 10 mg and hydrochlorothiazide 25 mg.  Patient states she does not take her blood pressure at home.  Patient missed not being adherent with her medication regimen which is likely the reason her blood pressure is high today.  I counseled her on the importance of taking her blood pressure medication as prescribed and she admits understanding.  Plan: -Restart lisinopril 10 mg and hydrochlorothiazide 25 mg daily

## 2020-03-25 NOTE — Assessment & Plan Note (Addendum)
Patient presents for follow-up appointment for her diabetes.  Patient was here a week ago and was experiencing medication side effects related to her glipizide.  She was having symptoms of hypoglycemia.  CBG in the clinic was significant for a low blood glucose of 70.  She was given some crackers and juice and her symptoms improved.  Patient was previously controlled with her diabetes with a hemoglobin A1c of 6.3.  Most recent hemoglobin A1c today is 10.1.  She attributes this increase due to changes in dietary habits.  Patient's last foot exam was on 03/19/2020 and her eye exam was last performed in 2017 showing diabetic retinopathy.  Her most recent microalbumin was 139  I counseled her on the importance of lifestyle modifications as well as medication management for her uncontrolled diabetes.  Patient states that she will try to do better with her diet and exercise to improve her A1c.  I will discontinue her glipizide as she has had side effects with this medication.  This was likely secondary to recent dental work making it difficult for her to take p.o. intake.  She has since supplementing her diet with Ensure when she is not able to tolerate mastication.  I discussed alternate medications to manage her diabetes.  She would likely benefit from being on an SGLT2 inhibitor due to her ongoing diabetic nephropathy.  Unfortunately this medication is still very expensive and will be difficult to access.  I will reach out to our pharmacy staff as well as Debera Lat (diabetic educator) for medication recommendations that would be effective and affordable.  Patient states that she is not willing to take injection medications at this time.  She previously had a bad reaction to Metformin consisting of a rash.  Plan: -Instructed patient to continue taking her blood sugar once daily, will refill her glucometer supplies -We will reach out to pharmacy and our diabetic educator for further assistance with her  medication management. I will also put in a chronic care management referral to see if she qualifies for any discount programs. -Instructed the patient make a follow-up with her opthalmologic for an eye exam.

## 2020-03-25 NOTE — Assessment & Plan Note (Signed)
Patient has a history of hyperlipidemia with a previous history of cryptogenic stroke.  She is currently taking aspirin 81 mg but not eyes cholesterol-lowering medications.  I counseled her on the importance of taking cholesterol-lowering medication to decrease her risk of subsequent strokes.  She admits understanding but states that she does not want to take statins at this time.  She states that she does not remember the reaction that she had to the medication but remembers it not being pleasant.  Plan: -Continue aspirin 81 mg -Continue to try to get patient take statin medication in the future.

## 2020-03-26 ENCOUNTER — Encounter: Payer: Medicare Other | Admitting: Internal Medicine

## 2020-03-26 ENCOUNTER — Encounter: Payer: Self-pay | Admitting: Dietician

## 2020-03-26 ENCOUNTER — Other Ambulatory Visit: Payer: Self-pay | Admitting: Internal Medicine

## 2020-03-26 DIAGNOSIS — E1169 Type 2 diabetes mellitus with other specified complication: Secondary | ICD-10-CM

## 2020-03-26 NOTE — Telephone Encounter (Signed)
Called [facility] about her testing supplies for the sample meter she got 02/12/2020.   They will cost her .28 cents for lancets and they were not sure if her strips would be free vs 2-3$.

## 2020-03-26 NOTE — Telephone Encounter (Signed)
Patient stated at her last visit she was having a difficult time getting her test strips. "A lot of things to keep up with". She has not called [facility], as she thought they were sent to [facility]. she states [facility] they probably does not have a copy of her Medicare card.   I informed Ms. Allerton  that Dr. Marianna Payment was having a hard time finding an affordable diabetes medicine on her formulary.  She asked for more information to be mailed to her about the SGLT2is. She states that she has [employer] value rx plan PDP Medicare D and [facility] has a copy of the card. I spoke with Dr. Marianna Payment about her medication, if it is unaffordable she may be able to use patient assistance to obtain it. I am happy to follow up with [facility] after the prescription is sent for a price check.    I gave Ms. Eversley a name and phone number to contact that the CCM social worker proveded about investigating Medicare plans as now is the time to enroll if she wants to change

## 2020-03-26 NOTE — Telephone Encounter (Signed)
Call to [facility], they do not have any recent diabetes medicine prescriptions for Ms Blecher. They think they can help [place] figure out the cost issue and alternatives after we send one.

## 2020-03-26 NOTE — Telephone Encounter (Signed)
Hello,  I sent them to the pharmacy yesterday. That would be great.

## 2020-03-27 ENCOUNTER — Other Ambulatory Visit: Payer: Self-pay | Admitting: Internal Medicine

## 2020-03-27 DIAGNOSIS — E1169 Type 2 diabetes mellitus with other specified complication: Secondary | ICD-10-CM

## 2020-03-27 MED ORDER — EMPAGLIFLOZIN 10 MG PO TABS: 10.0000 mg | ORAL_TABLET | Freq: Every day | ORAL | 2 refills | Status: AC

## 2020-03-27 NOTE — Telephone Encounter (Signed)
Call to [facility] to find out the cost of the jardiance prescription: of the $696 cost per month, insurance pays $166 and patient responsibility is $467. They said it is her plan causing it to cost that  Much. I spoke with Claiborne Billings, [employer] who suggest a free trial card and/or samples  and patient assistance which she is willing to help patient with.  Free 14 day trial coupon called in to [facility] and patient notified about free trial and to pick up samples.

## 2020-03-27 NOTE — Telephone Encounter (Signed)
Pt requested that patient assistance application and meds after approval be mailed to: [address] [place],   [address].  Mailed application with highlighted areas for patient completion to pt today with instructions to return with proof of income.

## 2020-04-09 ENCOUNTER — Telehealth: Payer: Self-pay | Admitting: Dietician

## 2020-04-09 NOTE — Telephone Encounter (Signed)
Wendy [name] called about her testing supplies. I reminded her that they are at [facility] and should not cost <3$.  She says she thinks the Jardiance might be making her dizzy. I encouraged her to drink more water. She'll call if she feels she needs an appointment. She is concerned about the cost of Jardiance once she runs out of samples. She states that she did receive the application for Jardiance patient assistance. I encouraged her to complete it as soon as possible and return it.

## 2020-04-10 NOTE — Telephone Encounter (Signed)
Thank you for letting me know

## 2020-04-18 ENCOUNTER — Telehealth: Payer: Self-pay | Admitting: Internal Medicine

## 2020-04-18 NOTE — Telephone Encounter (Signed)
Pt requesting a call back about her diabetes medications.

## 2020-04-18 NOTE — Telephone Encounter (Signed)
Pt called very confused and doesn't seem to understand that kellyg. Is trying to help her with the jardiance. Will ask kelly and donnap.

## 2020-04-22 NOTE — Telephone Encounter (Signed)
Wendy [name] asks for a call back later/at another time due to having a family crisis right now.

## 2020-04-24 NOTE — Telephone Encounter (Signed)
Dr. Sherry Ruffing,  I recommend CCM referral to assist Wendy [name] with her self care, affording her diabetes medicine, per her pharmacy her current Medicare D plan does not cover the cost of diabetes medicines well.

## 2020-04-24 NOTE — Telephone Encounter (Signed)
Spoke to patient today on the phone regarding Jardiance application.  She did receive the application that I mailed to her, but she also stated that she received patient assistance information from [facility] as well and was confused as to which form she was supposed to be completing.  I advised patient that I will assist her in completing the [place] application because the Vania Rea is what she is needing assistance with currently and that our time together would be focused on this and not the [facility] forms.  Patient stated she is hesitant to complete the financial portion of the application-she wanted a specific $ amount for eligibility.  Eligibility income amount is based on household size-I advised patient that as long as she fell under 300% of the Federal Poverty Level she would qualify, but I couldn't give her a specific amount without knowing her household size.  Pt also stated that she is in temporary housing, no permanent address for meds to be shipped to.  When asked about her P.O. box, she stated that her po box was closed.  I ended the call, letting her know that I would call her back once I determined whether or not we could have the meds shipped to the office and she was open to this option.

## 2020-05-01 NOTE — Telephone Encounter (Signed)
Spoke to patient on the phone today and talked her through filling out the application.  Patient stated that she would have someone drop off the application to the front desk at Internal Medicine.  Patient was concerned that by signing the application she would be responsible for the cost of the medication.  I assured her that by signing the application she was giving permission to [facility] to process the application to determine eligibility for the program and that she would receive the medication at no cost through 06/13/20 if approved.

## 2020-05-13 ENCOUNTER — Encounter: Payer: Medicare Other | Admitting: Internal Medicine

## 2020-05-15 ENCOUNTER — Telehealth: Payer: Self-pay

## 2020-05-15 NOTE — Telephone Encounter (Signed)
Spoke to patient today.  Patient stated that she missed her ride to the office to turn in paperwork.  Now stating that she received a letter from social security that she doesn't qualify for extra help in regards to prescription coverage and thought it pertained to the [place] application.  I told patient that the letter from social security did not pertain to the [place] patient assistance application and that the sooner she got the patient portion to me the better--cut off for 2021 is 05/30/2020.  Patient still seemed confused regarding the process and had questions regarding Medicare enrollment that I continued to tell her that I could not assist her with.  Pt stated that she would call the clinic because she had to make an appointment and then possibly bring paperwork in then.  I advised that if we didn't get the paperwork before the 17th I would not be able to help her for 2021.

## 2020-05-16 ENCOUNTER — Encounter: Payer: Self-pay | Admitting: Internal Medicine

## 2020-05-16 ENCOUNTER — Ambulatory Visit (INDEPENDENT_AMBULATORY_CARE_PROVIDER_SITE_OTHER): Payer: Medicare Other | Admitting: Internal Medicine

## 2020-05-16 ENCOUNTER — Other Ambulatory Visit: Payer: Self-pay

## 2020-05-16 VITALS — BP 142/74 | HR 85 | Temp 98.2°F | Ht 64.0 in | Wt 195.2 lb

## 2020-05-16 DIAGNOSIS — Z1211 Encounter for screening for malignant neoplasm of colon: Secondary | ICD-10-CM

## 2020-05-16 DIAGNOSIS — N1832 Chronic kidney disease, stage 3b: Secondary | ICD-10-CM | POA: Diagnosis not present

## 2020-05-16 DIAGNOSIS — E782 Mixed hyperlipidemia: Secondary | ICD-10-CM | POA: Diagnosis not present

## 2020-05-16 DIAGNOSIS — M1611 Unilateral primary osteoarthritis, right hip: Secondary | ICD-10-CM

## 2020-05-16 DIAGNOSIS — M1990 Unspecified osteoarthritis, unspecified site: Secondary | ICD-10-CM

## 2020-05-16 DIAGNOSIS — D649 Anemia, unspecified: Secondary | ICD-10-CM | POA: Diagnosis not present

## 2020-05-16 DIAGNOSIS — I639 Cerebral infarction, unspecified: Secondary | ICD-10-CM

## 2020-05-16 DIAGNOSIS — I69320 Aphasia following cerebral infarction: Secondary | ICD-10-CM | POA: Diagnosis not present

## 2020-05-16 DIAGNOSIS — M1712 Unilateral primary osteoarthritis, left knee: Secondary | ICD-10-CM

## 2020-05-16 DIAGNOSIS — Z13820 Encounter for screening for osteoporosis: Secondary | ICD-10-CM | POA: Diagnosis not present

## 2020-05-16 DIAGNOSIS — D631 Anemia in chronic kidney disease: Secondary | ICD-10-CM

## 2020-05-16 DIAGNOSIS — E1169 Type 2 diabetes mellitus with other specified complication: Secondary | ICD-10-CM | POA: Diagnosis not present

## 2020-05-16 DIAGNOSIS — I129 Hypertensive chronic kidney disease with stage 1 through stage 4 chronic kidney disease, or unspecified chronic kidney disease: Secondary | ICD-10-CM

## 2020-05-16 DIAGNOSIS — I10 Essential (primary) hypertension: Secondary | ICD-10-CM | POA: Diagnosis not present

## 2020-05-16 DIAGNOSIS — Z0000 Encounter for general adult medical examination without abnormal findings: Secondary | ICD-10-CM

## 2020-05-16 LAB — POCT GLYCOSYLATED HEMOGLOBIN (HGB A1C): Hemoglobin A1C: 7.9 % — AB (ref 4.0–5.6)

## 2020-05-16 LAB — GLUCOSE, CAPILLARY: Glucose-Capillary: 202 mg/dL — ABNORMAL HIGH (ref 70–99)

## 2020-05-16 NOTE — Assessment & Plan Note (Signed)
BP Readings from Last 3 Encounters:  05/16/20 (!) 142/74  03/24/20 (!) 154/76  03/19/20 (!) 159/81   On HCTZ 25 mg qd and lisinpril 10 mg qd. BP slightly elevated today. She was not at first certain whether she was on lisinopril or not and kept getting this medication confused with glipizde.   - f/u one month, requested she bring medications to assess what she is taking  - BMP, Mg today

## 2020-05-16 NOTE — Progress Notes (Signed)
CC: type II diabetes  HPI:  WendyWendy [name] is a 69 y.o. with PMH as below.   Please see A&P for assessment of the patient's acute and chronic medical conditions.   Last a1c 10.1 in August. She has declined injection medication and is in the process of obtaining assistance to get prescription jardiance, received samples at last visit. Previously had hypoglycemia while on glipizide. Has allergy to metformin. a1c today improved to 7.9, she has also been trying to eat healthier but is still struggling.   Previous cryptogenic stroke narrowed to aspirin only. She was not taking this last visit and it was restarted, and she is now taking it. Previous deficits appear to be expressive aphasia, and this is definitely still present. Difficult with word finding and expressing herself. Additionally has a lot of resistance to taking medications and presents poor understanding of what's required for maintaining her health. I believe this may be a mix of mistrust in medicine and possibly her prior stroke.  It appears she was supposed to follow-up with neurology in 2020.   She has chronic arthritis of the right hip and left knee that are restricting her mobility. She was seen previously by orthopedics and had steroid injection to the hip that did not help. Was also told she would need knee surgery 15 years ago but did not want to do this. She does not want to go back to orthopedics to see what other options might be available. She is taking aleve and tylenol for pain. She lives at home with her son and husband and her bedroom is upstairs. She sometimes does not go downstairs and has meals brought up to her due to pain and difficulty with ambulation.   Tried to discuss why she is not on a statin but she did not want to talk about this. Would not explain whether she had a reaction just that "she did not want to take it and wouldn't."   Past Medical History:  Diagnosis Date  . Acute right ankle pain  05/25/2018  . Diabetes mellitus   . Hypertension   . Pain in gums 02/25/2020  . Peroneal tendinitis, right leg 05/30/2018  . Pigmented skin lesion of uncertain nature 01/16/8849   Wendy [name] reports a 3 mo hx of a skin lesion, centrally located below her breasts. She is concerned because her sister developed breast cancer after noticing a spot on her breast. Patient reports no bleeding or itching. She states it is not getting progressively larger. She states she accidentally scraped it one day which was painful. She has never had anything like this before. She denies a fami  . Sarcoidosis   . Screening mammogram, encounter for 06/29/2012  . Stroke [facility])    Review of Systems:   10 point ROS negative except as noted in HPI  Physical Exam: Constitution: NAD, appears stated age Eyes: eom intact, PERRLA Cardio: RRR, no m/r/g, no LE edema  Respiratory: CTA, no w/r/r MSK: moving all extremities, pain with movement of right hip and left knee Neuro: expressive aphasia, difficulty with memory, a&ox3; CN II-XII grossly intact Skin: c/d/i    Vitals:   05/16/20 1041  BP: (!) 144/75  Pulse: 91  Temp: 98.2 F (36.8 C)  TempSrc: Oral  SpO2: 100%  Weight: 195 lb 3.2 oz (88.5 kg)  Height: 5\' 4"  (1.626 m)     Assessment & Plan:   See Encounters Tab for problem based charting.  Patient discussed with Dr. Dareen Piano

## 2020-05-16 NOTE — Patient Instructions (Signed)
Thank you for allowing [place] to provide your care today. Today we discussed your type II diabetes    Please take one half tablet of jardiance per day (12.5 mg) while waiting for your prescription to be approved.     Please follow-up in one month for blood pressure check. Please bring your medications with you.    Should you have any questions or concerns please call the internal medicine clinic at [phone].

## 2020-05-16 NOTE — Assessment & Plan Note (Signed)
Last a1c 10.1 in August. She has declined injection medication and is in the process of obtaining assistance to get prescription jardiance, received samples at last visit. Previously had hypoglycemia while on glipizide. Has allergy to metformin. a1c today improved to 7.9, she has also been trying to eat healthier but is still struggling.   - cont. jardiance 10 mg qd - given sample today - working with CCM to get jardiance approved, brought in paperwork today  - encouraged continued work toward healthy diet

## 2020-05-16 NOTE — Assessment & Plan Note (Signed)
She has chronic arthritis of the right hip and left knee that are restricting her mobility. She was seen previously by orthopedics and had steroid injection to the hip that did not help. Was also told she would need knee surgery 15 years ago but did not want to do this. She does not want to go back to orthopedics to see what other options might be available. She is taking aleve and tylenol for pain. She lives at home with her son and husband and her bedroom is upstairs. She sometimes does not go downstairs and has meals brought up to her due to pain and difficulty with ambulation.   - walker ordered - PT

## 2020-05-16 NOTE — Assessment & Plan Note (Signed)
-   fit test today - discussed colonoscopy which she will think about

## 2020-05-16 NOTE — Assessment & Plan Note (Signed)
Previous cryptogenic stroke narrowed to aspirin only. She was not taking this last visit and it was restarted, and she is now taking it. Previous deficits appear to be expressive aphasia, and this is still present. Difficult with word finding and expressing herself. Per nursing this has been present since stroke. Additionally has a lot of resistance to taking medications and presents poor understanding of what's required for maintaining her health. I believe this may be a mix of mistrust in medicine and possibly her prior stroke.  It appears she was supposed to follow-up with neurology in 2020.   - continue aspirin  - referral made to neurology for cryptogenic stroke follow-up  - consider PHQ-9 at follow-up

## 2020-05-16 NOTE — Assessment & Plan Note (Signed)
Tried to discuss why she is not on a statin but she did not want to talk about this. Would not explain whether she had a reaction just that "she did not want to take it and wouldn't."

## 2020-05-17 LAB — BMP8+ANION GAP
Anion Gap: 15 mmol/L (ref 10.0–18.0)
BUN/Creatinine Ratio: 16 (ref 12–28)
BUN: 29 mg/dL — ABNORMAL HIGH (ref 8–27)
CO2: 24 mmol/L (ref 20–29)
Calcium: 9.7 mg/dL (ref 8.7–10.3)
Chloride: 98 mmol/L (ref 96–106)
Creatinine, Ser: 1.79 mg/dL — ABNORMAL HIGH (ref 0.57–1.00)
GFR calc Af Amer: 33 mL/min/{1.73_m2} — ABNORMAL LOW (ref >59)
GFR calc non Af Amer: 29 mL/min/{1.73_m2} — ABNORMAL LOW (ref >59)
Glucose: 207 mg/dL — ABNORMAL HIGH (ref 65–99)
Potassium: 4.2 mmol/L (ref 3.5–5.2)
Sodium: 137 mmol/L (ref 134–144)

## 2020-05-17 LAB — LIPID PANEL
Chol/HDL Ratio: 4.6 ratio — ABNORMAL HIGH (ref 0.0–4.4)
Cholesterol, Total: 280 mg/dL — ABNORMAL HIGH (ref 100–199)
HDL: 61 mg/dL (ref >39)
LDL Chol Calc (NIH): 197 mg/dL — ABNORMAL HIGH (ref 0–99)
Triglycerides: 122 mg/dL (ref 0–149)
VLDL Cholesterol Cal: 22 mg/dL (ref 5–40)

## 2020-05-17 LAB — CBC
Hematocrit: 39 % (ref 34.0–46.6)
Hemoglobin: 12.8 g/dL (ref 11.1–15.9)
MCH: 27.9 pg (ref 26.6–33.0)
MCHC: 32.8 g/dL (ref 31.5–35.7)
MCV: 85 fL (ref 79–97)
Platelets: 255 10*3/uL (ref 150–450)
RBC: 4.59 x10E6/uL (ref 3.77–5.28)
RDW: 13.5 % (ref 11.7–15.4)
WBC: 5.7 10*3/uL (ref 3.4–10.8)

## 2020-05-19 NOTE — Progress Notes (Signed)
Internal Medicine Clinic Attending  Case discussed with Dr. Seawell  At the time of the visit.  We reviewed the resident's history and exam and pertinent patient test results.  I agree with the assessment, diagnosis, and plan of care documented in the resident's note.  

## 2020-05-22 ENCOUNTER — Telehealth: Payer: Self-pay

## 2020-05-22 NOTE — Telephone Encounter (Signed)
Called patient to verify income amount, amount left blank on application.  Pt states $501 per month in social security.  Application completed and placed in provider's box for signature, will fax to [place] once provider signature is obtained.

## 2020-05-27 ENCOUNTER — Encounter: Payer: Self-pay | Admitting: *Deleted

## 2020-05-27 NOTE — Addendum Note (Signed)
Addended by: Hulan Fray on: 05/27/2020 06:49 AM   Modules accepted: Orders

## 2020-06-03 ENCOUNTER — Telehealth: Payer: Self-pay | Admitting: *Deleted

## 2020-06-03 ENCOUNTER — Telehealth: Payer: Self-pay

## 2020-06-03 ENCOUNTER — Telehealth: Payer: Self-pay | Admitting: Pharmacist

## 2020-06-03 NOTE — Telephone Encounter (Signed)
Pt states she ran out of jardiance Friday and cannot afford it, could pt get some samples? Please call her [phone]

## 2020-06-03 NOTE — Telephone Encounter (Signed)
Patient has already used her 14 day free trial coupon, so a sample would be what we would have to do if appropriate.  I called to check the status of her PAP app today and [employer] says they are behind on processing and it will still be another 10 business days before they are able to process the application.

## 2020-06-03 NOTE — Telephone Encounter (Signed)
Provided sample. Pt will pick up tomorrow.

## 2020-06-03 NOTE — Telephone Encounter (Signed)
Patient called stating her medication was too expensive to afford. PAP has been submitted but we are still awaiting approval. Patient has been out of medication since 05/30/20.  I spoke with patient on phone today and stated we could provide her a sample of her Jardiance 10mg . Patient will come to clinic tomorrow, 06/04/20, to pick up.   Medication Samples have been provided to the patient.  Drug name: empagliflozin (Jardiance)    Strength: 10mg         Qty: 14 tablets  LOT: [id number]  Exp.Date: 08/23  Dosing instructions: take one tablet by mouth daily before breakfast  The patient has been instructed regarding the correct time, dose, and frequency of taking this medication, including desired effects and most common side effects.   Patient states she has been tolerating the medication well, but adds she has been experiencing some lightheadedness that has improved since running out of the medication and no longer taking. She does check her BP occasionally at home, but forgot to write down her numbers. She states she has had some readings in the 90's but unable to verify if this is the systolic or diastolic, although she believes it to be the systolic. She is aware to make sure she stays hydrated. Patient may be experiencing hypotension that can happen as a side effect of the medication.  -Counseled patient to move slowly when changing positions and to continue to stay hydrated. -Instructed patient to record BP readings upon re-initiation of Jardiance and l follow-up with patient on 06/09/20. -If patient's BP readings indicate hypotension, will recommend to PCP a decrease in her BP medications.   Hughes Better 4:35 PM 06/03/2020

## 2020-06-03 NOTE — Telephone Encounter (Signed)
Called [employer] to check status of patient assistance application that was submitted on 05/29/20.  Per BI rep, processing time for new enrollments is 10-14 business days at this time.  Application was verified as received, but not processed as of yet.  Advised to call back in 10 more business day to check status if not notified by then regarding enrollment status.  **Jardiance**

## 2020-06-04 ENCOUNTER — Telehealth: Payer: Self-pay

## 2020-06-04 NOTE — Telephone Encounter (Signed)
Patient's husband presents to clinic to pick up Jardiance samples.  Samples already pulled and were given to husband by Dr. Gilford Rile.  Husband verified patient's DOB and patient called to say husband was on his way to pick up samples. SChaplin, RN,BSN

## 2020-06-09 NOTE — Telephone Encounter (Signed)
I called patient to discuss her previous complaints of dizziness while on Jardiance 10mg .   Patient re-started medication on 06/04/20 when her husband picked up the sample. Since last week she has missed one dose of the medication due to falling asleep. She currently takes the medication at night. She has no current complaints of dizziness and she states she has been feeling fine. She has not checked her BP since we last spoke.  Recommended patient take Jardiance 10mg  in the morning or before first meal rather than at bedtime for best benefit. I instructed patient to check BP and write down readings at least a few times over the next week and I will follow-up with her then.

## 2020-06-20 ENCOUNTER — Telehealth: Payer: Self-pay | Admitting: Pharmacist

## 2020-06-20 ENCOUNTER — Telehealth: Payer: Self-pay

## 2020-06-20 NOTE — Telephone Encounter (Signed)
Called patient to discuss [employer] empagliflozin (Jardiance) 10mg  application. Application has not been approved as of yet and Cheryle Horsfall, CPhT is working on the issue. Patient has almost completed samples provided and discussed with patient we are able to provide her with more in the interim. I will pull sample of Jardiance on 06/23/20 for patient to pick-up.  Per previous discussions with the patient I asked that she monitor her home blood pressure as she was suspecting she was hypotensive while taking Jardiance. She previously complained of dizziness when taking the medication but had no BP readings to provide. Today, patient was able to verbally provide the recorded home BP readings shown below: 92/62 mm Hg 101/64 mm Hg 94/60 mm Hg  Based on previous discussions with the patient over the phone regarding these symptoms and our discussion today, as well as her BP readings, I recommended patient stop taking hydrochlorothiazide 25mg  until 06/23/20 when I can discuss with primary care team. Patient will continue to take lisinopril 10mg  and record her home blood pressure readings.

## 2020-06-20 NOTE — Telephone Encounter (Signed)
RTC, patient is asking for an update on PAP regarding Jardiance.  Will forward to Dr. Francesca Jewett, RN,BSN

## 2020-06-20 NOTE — Telephone Encounter (Signed)
Please call pt back about meds.  

## 2020-06-20 NOTE — Telephone Encounter (Signed)
She has the number for the IM office, but my number at [facility] is [phone] or she can try the pharmacy at [phone] if my line is busy.  I called [employer] yesterday and they denied it because they need her social security benefits letter/proof of income.  I faxed them an advocate letter yesterday and will call today to see if this worked so that the patient will not have to bring anything in (she had indicated that she didn't feel comfortable providing the proof of income and I had told her I would submit it on her behalf and see what [employer] says.. & they want the proof of income).  [employer] stated they were delayed processing applications in December bc of the # of re-enrollments and processing applications are taking about 20 days at this time.

## 2020-06-20 NOTE — Telephone Encounter (Signed)
Thanks Claiborne Billings! I will call her and let her know!

## 2020-06-23 NOTE — Telephone Encounter (Signed)
Triage - -  Can you call this patient to check on her?  Front Desk - -  Can you call this patient and offer her an in person appointment for BP follow up.   Thank you!

## 2020-06-23 NOTE — Telephone Encounter (Signed)
TC to patient who states she has stopped taking her hydrochlorothiazide.  She states she is busy and is in her car, just leaving her house.  Patient states she recently spoke to [place] about her medications and her b/p readings.  RN asked patient if she was still taking her b/p's at home, she said yes, but doesn't remember what they are as she has them written down at home.  RN advised patient that MD would like her to come in for an in-person appt for a b/p check.  Pt becomes upset and states she does not want to do this and she is busy right now.  RN suggested Dr. Georgina Peer call patient tomorrow to obtain last few b/p readings and she was in agreement. Will forward to PCP and Dr. Georgina Peer. SChaplin, RN,BSN

## 2020-06-23 NOTE — Telephone Encounter (Signed)
Thanks Stacee!  Sorry to cause an upset!

## 2020-06-23 NOTE — Telephone Encounter (Addendum)
Medication Samples have been placed in bag in sample room  Drug name: empagliflozin (Jardiance)      Strength: 10mg       Qty: 14 tablets  LOT: [id number]  Exp.Date: 08/23  Dosing instructions: take one tablet by mouth daily before breakfast  The patient has been instructed regarding the correct time, dose, and frequency of taking this medication, including desired effects and most common side effects.   If the patient presents today to pick up sample please ask her about her blood pressure over the past few days and confirm she has stopped hydrochlorothiazide for the time being. She will need to follow-up with provider regarding her blood pressure management.  Hughes Better 8:13 AM 06/23/2020

## 2020-06-24 ENCOUNTER — Telehealth: Payer: Self-pay | Admitting: Pharmacist

## 2020-06-24 NOTE — Telephone Encounter (Signed)
I called and spoke with Wendy [name] to follow-up in regards to our discussion last Friday about her Jardiance medication.  Patient has yet to pick up sample, and has begun taking 1/2 tablet of Jardiance 10mg  to prolong her medication. Reminded patient that we had discussed leaving sample for her until PAP processes, and that she is free to come get it as soon as she is able.   Patient verbalized she has stopped hydrochlorothiazide 25mg  since our conversation on Friday. Patient was instructed to continue to monitor blood pressure until clinic was able to follow-up. Patient has been checking blood pressure over past several days but was unable to provide numbers on the phone today. She states she feels "a little better" and that she is still having dizziness but "it is not like it was." She was getting ready for a funeral service and asked that I call her back tomorrow to go over her recorded readings.  I will call patient tomorrow morning to go over readings and remind her to pick up her Jardiance sample. Patient was apprehensive about setting up provider appointment for blood pressure management as she does not want to pay the co-pay.

## 2020-06-24 NOTE — Telephone Encounter (Signed)
Agree with plan 

## 2020-06-25 NOTE — Addendum Note (Signed)
Addended by: Hulan Fray on: 06/25/2020 05:30 PM   Modules accepted: Orders

## 2020-06-26 ENCOUNTER — Other Ambulatory Visit: Payer: Self-pay | Admitting: Internal Medicine

## 2020-06-26 ENCOUNTER — Encounter: Payer: Self-pay | Admitting: Pharmacist

## 2020-06-26 DIAGNOSIS — I639 Cerebral infarction, unspecified: Secondary | ICD-10-CM

## 2020-06-26 NOTE — Progress Notes (Signed)
Patient presented to pick up her Jardiance 10mg  samples and while here I checked patient's BP.  I have had several prior discussions with patient regarding her blood pressure and symptoms of hypotension. In clinic today patient's systolic BP was elevated compared to previous readings patient has given me verbally over the phone. However, patient states she thinks it is likely elevated due to currently feeling stressed. In addition, patient has been out of Jardiance for two days and the three days prior to this she was taking 1/2 a pill to extend her medication. Patient did not bring in recorded BP readings and was not able to provide me with any readings over the phone the past few days due to being busy. However, patient stated readings have continued to be below <130/80 mm Hg.   Recommended patient continue to hold off on taking hydrochlorothiazide 25mg  and I would call her early next week after obtain her home blood pressure readings since re-starting Jardiance. Patient agreed to plan. Also stressed importance of patient following up with provider for further blood pressure management.  Discussed patient and plan with Dr. Philipp Ovens.

## 2020-06-26 NOTE — Progress Notes (Signed)
Dr Philipp Ovens, Per Rachelle's note, please place a referral to CCM for social work to help this patient with senior housing. Thanks, Marcie Bal

## 2020-07-01 ENCOUNTER — Ambulatory Visit: Payer: Medicare Other | Admitting: *Deleted

## 2020-07-01 ENCOUNTER — Ambulatory Visit: Payer: Self-pay

## 2020-07-01 DIAGNOSIS — E1169 Type 2 diabetes mellitus with other specified complication: Secondary | ICD-10-CM

## 2020-07-01 DIAGNOSIS — I639 Cerebral infarction, unspecified: Secondary | ICD-10-CM

## 2020-07-01 DIAGNOSIS — N1832 Chronic kidney disease, stage 3b: Secondary | ICD-10-CM

## 2020-07-01 DIAGNOSIS — I10 Essential (primary) hypertension: Secondary | ICD-10-CM

## 2020-07-01 NOTE — Chronic Care Management (AMB) (Signed)
  Chronic Care Management    Social Work Note  07/01/2020 Name: Wendy [name] MRN: [id number] DOB: May 02, 1951  Wendy [name] is a 70 y.o. year old female who is a primary care patient of Sherry Ruffing, Adela Lank, MD. The CCM team was consulted to assist the patient with chronic disease management and/or care coordination needs related to: senior housing resources.   Engaged with patient Engaged with patient by telephone for initial visit in response to provider referral for social work chronic care management and care coordination services.   Consent to Services:  Patient was given the following information about Chronic Care Management services today, agreed to services, and gave verbal consent: 1. CCM service includes personalized support from designated clinical staff supervised by primary care provider, including individualized plan of care and coordination with other care providers 2. 24/7 contact phone numbers for assistance for urgent and routine care needs. 3. Service will only be billed when office clinical staff spend 20 minutes or more in a month to coordinate care. 4. Only one practitioner may furnish and bill the service in a calendar month. 5.The patient may stop CCM services at any time (effective at the end of the month) by phone call to the office staff. 6. The patient will be responsible for cost sharing (co-pay) of up to 20% of the service fee (after annual deductible is met). Patient agreed to services and consent obtained.  Patient agreed to services and consent obtained.   Assessment/Interventions: Review of patient past medical history, allergies, medications, and health status, including review of relevant consultants reports was performed today as part of a comprehensive evaluation and provision of chronic care management and care coordination services.     SDOH (Social Determinants of Health) assessments and interventions performed:    BSW provided patient with 4 senior living  communities in the [place] area: [facility], [place] [facility], [facility], and [facility]. BSW informed patient that she would send these resources to her via mail.   Advanced Directives Status: Not addressed in this encounter.  CCM Care Plan  Allergies  Allergen Reactions  . Metformin And Related Nausea Only    "severe itching"    '@THNMEDREVIEW'$ @  Patient Active Problem List   Diagnosis Date Noted  . Health care maintenance 02/28/2019  . History of cardioembolic cerebrovascular accident (CVA) 10/20/2018  . Unilateral primary osteoarthritis, right hip 05/30/2018  . Osteoarthritis 03/10/2018  . Cryptogenic stroke ([place]) 09/26/2015  . CKD (chronic kidney disease) stage 3, GFR 30-59 ml/min (HCC) 10/14/2014  . Left arm pain 10/14/2014  . Background diabetic retinopathy(362.01) 07/25/2012  . Sarcoidosis of lung ([place]) 09/30/2011  . Hyperlipidemia 08/10/2011  . Type 2 diabetes mellitus ([place]) 06/26/2008  . Essential hypertension, benign 06/26/2008  . Primary localized osteoarthritis of left knee 06/26/2008    Conditions to be addressed/monitored per PCP order: none; senior living housing  There are no care plans to display for this patient.    Follow Up Plan: Appointment scheduled for SW follow up with client by phone on: 07/21/20      Mickel Fuchs, [place], [place]

## 2020-07-01 NOTE — Chronic Care Management (AMB) (Signed)
   07/01/2020  Wendy [name] 07-18-1950 [id number]  [place] information mailed to patient's  home address at the request of [place].   Kelli Churn RN, CCM, [facility] RN Care Manager [phone]

## 2020-07-01 NOTE — Patient Instructions (Signed)
Visit Information  There are no care plans to display for this patient.   [name] was given information about Chronic Care Management services today including:  1. CCM service includes personalized support from designated clinical staff supervised by her physician, including individualized plan of care and coordination with other care providers 2. 24/7 contact phone numbers for assistance for urgent and routine care needs. 3. Service will only be billed when office clinical staff spend 20 minutes or more in a month to coordinate care. 4. Only one practitioner may furnish and bill the service in a calendar month. 5. The patient may stop CCM services at any time (effective at the end of the month) by phone call to the office staff. 6. The patient will be responsible for cost sharing (co-pay) of up to 20% of the service fee (after annual deductible is met).  Patient agreed to services and verbal consent obtained.    The care management team will reach out to the patient again over the next 14 days.   Wendy [name]

## 2020-07-03 NOTE — Progress Notes (Signed)
Internal Medicine Clinic Attending  CCM services provided by the care management provider and their documentation were discussed with Dr. Sherry Ruffing. We reviewed the pertinent findings, urgent action items addressed by the resident and non-urgent items to be addressed by the PCP.  I agree with the assessment, diagnosis, and plan of care documented in the CCM and resident's note.  Axel Filler, MD 07/03/2020

## 2020-07-03 NOTE — Progress Notes (Signed)
Internal Medicine Clinic Resident  I have personally reviewed this encounter including the documentation in this note and/or discussed this patient with the care management provider. I will address any urgent items identified by the care management provider and will communicate my actions to the patient's PCP. I have reviewed the patient's CCM visit with my supervising attending, Dr Evette Doffing.  Asencion Noble, MD 07/03/2020

## 2020-07-04 ENCOUNTER — Telehealth: Payer: Self-pay

## 2020-07-04 NOTE — Telephone Encounter (Signed)
Wendy [name] is approved thru the [employer] program until 06/13/21.

## 2020-07-04 NOTE — Telephone Encounter (Signed)
That sounds great! Thanks again.

## 2020-07-04 NOTE — Telephone Encounter (Signed)
I was going to call her once we received the meds.  She recently rec'd samples so she should be good until we receive the meds.  Myself or Rosendo Gros will be responsible for refills

## 2020-07-04 NOTE — Telephone Encounter (Signed)
Thank you for your message. Do you know if Wendy [name] knows how to access the [place] through the [employer]?

## 2020-07-07 ENCOUNTER — Telehealth: Payer: Self-pay | Admitting: Pharmacist

## 2020-07-07 NOTE — Telephone Encounter (Signed)
Thank you for your help. She needs an in person visit to discuss her blood pressure and medications, especially with her history of stroke. I agree that she should call us if she develops hypo or hypertension. Would you be able to confirm which medications she is taking the next time you speak with her since there was some confusion on her last visit? I will forward this to the front desk to try and schedule her an appointment. Thank you again. Lonia Skinner

## 2020-07-07 NOTE — Telephone Encounter (Signed)
Called patient to discuss BP readings upon restarting empagliflozin (Jardiance) 10mg  as her BP was elevated in clinic on 06/26/20.  Patient was not home and therefore unable to provide recorded BP readings, however, states her BP was slightly elevated in the few days following visit. She states her BP readings were close to the in-clinic BP taken. She began experiencing a headache so she restarted her Hydrochlorothiazide 25mg . She has since stopped taking the HCTZ as her BP has come back down.   Continued to stress with patient importance of in-person provider visits to continue to monitor her BP. If she develops symptomatic hypo or hypertension instructed to call clinic rather than re-starting and stopping medications as needed. PAP approved through [facility] for Jardiance so this should help with blood pressure management as patient should not be without Jardiance moving forward. Needs close blood pressure monitoring until stable on Jardiance.

## 2020-07-08 ENCOUNTER — Telehealth: Payer: Self-pay | Admitting: Pharmacist

## 2020-07-08 ENCOUNTER — Telehealth: Payer: Self-pay | Admitting: Internal Medicine

## 2020-07-08 NOTE — Telephone Encounter (Signed)
Patient requesting a call back

## 2020-07-08 NOTE — Telephone Encounter (Signed)
I spoke with patient. It was very difficult to hear her on the phone.  Patient reports she had chest pain 4 days ago.  Since then may have had indigestion. She reports she is currently having neck pain and pain going down her left arm.  Patient with history of loop recorder which has been removed.  I advised patient to call 911 due to ongoing left arm and neck pain.  Patient reports she may call tomorrow.   I advised her she should call 911 now.

## 2020-07-08 NOTE — Telephone Encounter (Signed)
Returned patient phone call and she stated she was wondering why someone had called her regarding her Jardiance.  Told patient on phone she was approved for PAP for Jardiance and assured her that the medication would be given to patient at no cost. She states that is fine but that she has been having issues with Jardiance since re-starting medication. She states they were the same issues that "we previously discussed." When asked if it was regarding her blood pressure, which I have been helping the patient manage, she states that it is not that, but rather that she has been having some chest and neck pain. She then states this is new and first occurred a few days ago and has come and gone. She asked if her Vania Rea could be causing her to have a heart attack as she read online that this is a side effect of the medication. I told her I do not believe it is the medication but if she is experiencing symptoms of a heart attack she needs to call 911 or go to the emergency room as time is important. She mentions her heart has previously been fine and wonders why if she has been monitored in the past she would now be experiencing these symptoms. I told her that a heart attack can occur at any time and it is important to be seen to rule this out. Patient had no desire to present to emergency department at this time and had no sense of urgency over the phone despite my efforts to try to convince her. I provided her cardiologist office phone number and asked her to at least call them immediately after, although told her they may give her the same advice I provided. Patient states she may call them however she was about to sit down to eat and states "I don't feel like calling right now, I may call tomorrow." Efforts to convince patient of urgency of symptoms were unsuccessful.

## 2020-07-08 NOTE — Telephone Encounter (Signed)
Pt c/o of Chest Pain: STAT if CP now or developed within 24 hours  1. Are you having CP right now? No.  2. Are you experiencing any other symptoms (ex. SOB, nausea, vomiting, sweating)? No.  3. How long have you been experiencing CP? About 5 days.  4. Is your CP continuous or coming and going? Coming and going.  5. Have you taken Nitroglycerin? No.  Patient states that she's experiencing chest pains and would like to be seen by Dr. Lovena Le this week. Please advise. ?

## 2020-07-11 ENCOUNTER — Telehealth: Payer: Self-pay

## 2020-07-11 NOTE — Telephone Encounter (Signed)
Patient stated may call Rachelle at a later date.

## 2020-07-15 DIAGNOSIS — D860 Sarcoidosis of lung: Secondary | ICD-10-CM | POA: Diagnosis not present

## 2020-07-15 DIAGNOSIS — Z0000 Encounter for general adult medical examination without abnormal findings: Secondary | ICD-10-CM | POA: Diagnosis not present

## 2020-07-15 DIAGNOSIS — E785 Hyperlipidemia, unspecified: Secondary | ICD-10-CM | POA: Diagnosis not present

## 2020-07-15 DIAGNOSIS — M1990 Unspecified osteoarthritis, unspecified site: Secondary | ICD-10-CM | POA: Diagnosis not present

## 2020-07-15 DIAGNOSIS — Z008 Encounter for other general examination: Secondary | ICD-10-CM | POA: Diagnosis not present

## 2020-07-15 DIAGNOSIS — E1169 Type 2 diabetes mellitus with other specified complication: Secondary | ICD-10-CM | POA: Diagnosis not present

## 2020-07-15 DIAGNOSIS — I129 Hypertensive chronic kidney disease with stage 1 through stage 4 chronic kidney disease, or unspecified chronic kidney disease: Secondary | ICD-10-CM | POA: Diagnosis not present

## 2020-07-15 DIAGNOSIS — E669 Obesity, unspecified: Secondary | ICD-10-CM | POA: Diagnosis not present

## 2020-07-15 DIAGNOSIS — N183 Chronic kidney disease, stage 3 unspecified: Secondary | ICD-10-CM | POA: Diagnosis not present

## 2020-07-15 DIAGNOSIS — Z8673 Personal history of transient ischemic attack (TIA), and cerebral infarction without residual deficits: Secondary | ICD-10-CM | POA: Diagnosis not present

## 2020-07-15 DIAGNOSIS — Z6833 Body mass index (BMI) 33.0-33.9, adult: Secondary | ICD-10-CM | POA: Diagnosis not present

## 2020-07-17 ENCOUNTER — Ambulatory Visit (INDEPENDENT_AMBULATORY_CARE_PROVIDER_SITE_OTHER): Payer: Medicare Other | Admitting: Student

## 2020-07-17 ENCOUNTER — Encounter: Payer: Self-pay | Admitting: Student

## 2020-07-17 ENCOUNTER — Other Ambulatory Visit: Payer: Self-pay

## 2020-07-17 VITALS — BP 157/66 | HR 75 | Temp 98.4°F | Ht 64.0 in | Wt 190.8 lb

## 2020-07-17 DIAGNOSIS — E1122 Type 2 diabetes mellitus with diabetic chronic kidney disease: Secondary | ICD-10-CM

## 2020-07-17 DIAGNOSIS — I10 Essential (primary) hypertension: Secondary | ICD-10-CM | POA: Diagnosis not present

## 2020-07-17 DIAGNOSIS — N1832 Chronic kidney disease, stage 3b: Secondary | ICD-10-CM | POA: Diagnosis not present

## 2020-07-17 DIAGNOSIS — I639 Cerebral infarction, unspecified: Secondary | ICD-10-CM

## 2020-07-17 NOTE — Assessment & Plan Note (Signed)
SUBJECTIVE: Patient reports that she is very concerned about the adverse effects of taking empagliflozin. Patient has had extensive discussions with clinical pharmacist without alleviation of her fears. Patient is unable to report a specific adverse effect that she is concerned about, however she states that she has been reading online about various "horrible things." Patient reports that she feels that she can manage her diabetes without any medication. Patient states that she may consider trial of metformin in a month, however she discontinued this medication in the past due to itching. Patient is unable to report how long she was taking metformin before she developed symptoms of itching.  OBJECTIVE: Last HbA1c 7.9 in December of 2021, improved from 10.1 three months prior  ASSESSMENT/PLAN: Patient continues to have poor control of her diabetes in the setting of self-discontinuation of medications and nonadherence. Patient has previously been prescribed metformin, glipizide, januvia and now jardiance. Patient is not a good candidate for insulin. Patient has desired discontinuation of each medication due to various adverse effects or concerns of adverse medication reactions. Patient has been tolerating her prescribed jardiance well according to her report today, however she is not interested in continuing this medication. Provided patient with trend of her hemoglobin A1c values over the years with clear improvement of her condition with medication adherence and worsening with nonadherence. Patient reports that she feels that her diabetes will improve with discontinuation of her medication regimen. Patient requests a trial off of medication for one month and evaluation of her hemoglobin A1c in March (three months from her last check). Patient reports that she would wish to restart metformin if her hemoglobin A1c is worse on recheck. -Discontinue jardiance -Follow-up HbA1c in March -If HbA1c is elevated  from 7.9, start metformin

## 2020-07-17 NOTE — Patient Instructions (Signed)
Wendy [name],  It was a pleasure meeting you in clinic today.  For your hypertension (high blood pressure): We support your decision to continue taking lisinopril 10mg  daily. I do still feel that you would benefit from increasing this medication to 20mg .  For your diabetes (high blood sugars): We support your decision to discontinue your jardiance and recheck your hemoglobin A1c in March. I agree that in March if your A1c remains elevated, which I suspect that it will be, that restarting metformin is a good plan.  Sincerely, Dr. Paulla Dolly, MD

## 2020-07-17 NOTE — Assessment & Plan Note (Signed)
GFR 33. Patient's CKD secondary to poorly controlled HTN and T2DM in the setting of nonadherence. -See problem Type 2 diabetes mellitus and Essential hypertension, benign

## 2020-07-17 NOTE — Assessment & Plan Note (Addendum)
SUBJECTIVE: Patient reports that she has been doing well since discontinuation of her HCTZ. She states that she has been adherent to lisinopril 10mg  without adverse effects. Patient states that she has been checking her blood pressure at home using two separate blood pressure cuffs which she applies to her wrists. She reports that her blood pressure readings have been "good" at home, however she is unable to provide specific values for these readings. She states that prior to discontinuation of her HCTZ, she was experiencing low blood pressure readings on her home blood pressure cuff. She attributes her continued elevated readings in our clinic to her being anxious and worried when she comes to clinic. She denies chest pain, shortness of breath, headaches, lightheadedness, dizziness.  OBJECTIVE:  BP 147/67 Right Arm  157/66 Left Arm  ASSESSMENT/PLAN: Patient continues to have poor control of her hypertension. Although patient may have a degree of "white coat hypertension" patient has had persistently elevated readings well above her target despite multiple rechecks at her appointments. Patient has progressive worsening of her CKD with most recent GFR of 33. Patient would benefit from escalation of her lisinopril from 10mg  to 20mg . Patient adamantly refuses escalation of her dose and states that she does feel that she needs the lisinopril 10mg . Following extensive discussion with shared decision making, patient agreeable to continue lisinopril 10mg  daily -Continue lisinopril 10mg  daily, continue to encourage escalation to 20mg  -Patient would be poor candidate for multidrug regimen due to self-discontinuation of medications and sporadic administration

## 2020-07-17 NOTE — Progress Notes (Signed)
   CC: Follow-up  HPI:  Wendy [name] is a 70 y.o. female with past medical history significant for HTN, T2DM, CKD3b who presents to clinic for follow-up. Refer to problem list for charting of this encounter.  Past Medical History:  Diagnosis Date  . Acute right ankle pain 05/25/2018  . Diabetes mellitus   . Hypertension   . Pain in gums 02/25/2020  . Peroneal tendinitis, right leg 05/30/2018  . Pigmented skin lesion of uncertain nature 0/02/9832   Wendy [name] reports a 3 mo hx of a skin lesion, centrally located below her breasts. She is concerned because her sister developed breast cancer after noticing a spot on her breast. Patient reports no bleeding or itching. She states it is not getting progressively larger. She states she accidentally scraped it one day which was painful. She has never had anything like this before. She denies a fami  . Sarcoidosis   . Screening mammogram, encounter for 06/29/2012  . Stroke [facility])    Review of Systems:  Denies polyuria, polydipsia, lightheadedness, dizziness, headaches, chest pain, shortness of breath.  Physical Exam:  Vitals:   07/17/20 1507 07/17/20 1605 07/17/20 1607  BP: (!) 149/61 (!) 147/67 (!) 157/66  Pulse: 75 77 75  Temp: 98.4 F (36.9 C)    TempSrc: Oral    SpO2: 97%    Weight: 190 lb 12.8 oz (86.5 kg)    Height: 5\' 4"  (1.626 m)     Physical Exam Constitutional:      General: She is not in acute distress. Cardiovascular:     Rate and Rhythm: Normal rate and regular rhythm.     Pulses: Normal pulses.     Heart sounds: Normal heart sounds.  Pulmonary:     Effort: Pulmonary effort is normal.     Breath sounds: Normal breath sounds.  Abdominal:     General: Abdomen is flat. Bowel sounds are normal.     Palpations: Abdomen is soft.     Tenderness: There is no abdominal tenderness.  Neurological:     General: No focal deficit present.     Mental Status: She is alert. Mental status is at baseline.  Psychiatric:         Mood and Affect: Mood normal.        Behavior: Behavior normal.      Assessment & Plan:   See Encounters Tab for problem based charting.  Patient discussed with Dr. Jimmye Norman

## 2020-07-21 ENCOUNTER — Ambulatory Visit: Payer: Self-pay

## 2020-07-21 ENCOUNTER — Telehealth: Payer: Medicare Other

## 2020-07-21 NOTE — Patient Instructions (Signed)
Visit Information  Thank you Ms. Ayars for speaking with me today. If you have any needs in the future I can be contacted at [phone]   Patient verbalizes understanding of instructions provided today and agrees to view in [place].   No further follow up required: patient was given SW telephone number for any additional resources or questions.  Mickel Fuchs, BSW, [place]  High Risk Managed Medicaid Team

## 2020-07-21 NOTE — Chronic Care Management (AMB) (Signed)
  Chronic Care Management    Social Work Note  07/21/2020 Name: Wendy [name] MRN: [id number] DOB: 1951/02/24  MARLIA SCHEWE is a 70 y.o. year old female who is a primary care patient of Sherry Ruffing, Adela Lank, MD. The CCM team was consulted to assist the patient with chronic disease management and/or care coordination needs related to: [employer] .   Engaged with patient Engaged with patient by telephone for follow up visit in response to provider referral for social work chronic care management and care coordination services.   Consent to Services:  Patient was given the following information about Chronic Care Management services today, agreed to services, and gave verbal consent: 1. CCM service includes personalized support from designated clinical staff supervised by primary care provider, including individualized plan of care and coordination with other care providers 2. 24/7 contact phone numbers for assistance for urgent and routine care needs. 3. Service will only be billed when office clinical staff spend 20 minutes or more in a month to coordinate care. 4. Only one practitioner may furnish and bill the service in a calendar month. 5.The patient may stop CCM services at any time (effective at the end of the month) by phone call to the office staff. 6. The patient will be responsible for cost sharing (co-pay) of up to 20% of the service fee (after annual deductible is met). Patient agreed to services and consent obtained.  Patient agreed to services and consent obtained.   Assessment/Interventions: Review of patient past medical history, allergies, medications, and health status, including review of relevant consultants reports was performed today as part of a comprehensive evaluation and provision of chronic care management and care coordination services.     SDOH (Social Determinants of Health) assessments and interventions performed:    Patient stated she did receive the list of  retirement options and would ready through them.  Advanced Directives Status: Not addressed in this encounter.  CCM Care Plan  Allergies  Allergen Reactions  . Metformin And Related Nausea Only    "severe itching"    _0 @  Patient Active Problem List   Diagnosis Date Noted  . Health care maintenance 02/28/2019  . History of cardioembolic cerebrovascular accident (CVA) 10/20/2018  . Unilateral primary osteoarthritis, right hip 05/30/2018  . Osteoarthritis 03/10/2018  . Cryptogenic stroke ([place]) 09/26/2015  . CKD (chronic kidney disease) stage 3, GFR 30-59 ml/min (HCC) 10/14/2014  . Left arm pain 10/14/2014  . Background diabetic retinopathy(362.01) 07/25/2012  . Sarcoidosis of lung ([place]) 09/30/2011  . Hyperlipidemia 08/10/2011  . Type 2 diabetes mellitus ([place]) 06/26/2008  . Essential hypertension, benign 06/26/2008  . Primary localized osteoarthritis of left knee 06/26/2008    Conditions to be addressed/monitored per PCP order: Retirement housing; retirement housing  There are no care plans to display for this patient.    Follow Up Plan: No furter follow up needed.      Mickel Fuchs, BSW, [place]  High Risk Managed Medicaid Team

## 2020-07-21 NOTE — Progress Notes (Signed)
Internal Medicine Clinic Attending  Case discussed with Dr. Wynetta Emery  At the time of the visit.  We reviewed the resident's history and exam and pertinent patient test results.  I agree with the assessment, diagnosis, and plan of care documented in the resident's note. Ms. Ginzburg is not consenting to recommended therapies and today Dr. Wynetta Emery approached her with a shared decision making strategy.  She will need to be onboard with therapy to ensure adherence and intended benefit.  This may take some time.

## 2020-07-25 ENCOUNTER — Telehealth: Payer: Self-pay

## 2020-07-31 NOTE — Telephone Encounter (Signed)
Patient is followed by CCM. Will route to them. Hubbard Hartshorn, BSN, RN-BC

## 2020-07-31 NOTE — Telephone Encounter (Signed)
t calling to report she has not heard back about her [place].  Pt states she has not heard back from anyone and would like a call back.

## 2020-08-01 NOTE — Telephone Encounter (Signed)
I spoke to patient about walker and I have sent a community message to [place], [place] C2/18/20229:49 AM

## 2020-08-01 NOTE — Telephone Encounter (Signed)
In reviewing chart, referral to CCM was for SW only and the socail worker signed off on 07/21/20 so CCM no longer active with this patient. Marcie Bal

## 2020-08-11 NOTE — Telephone Encounter (Signed)
CM sent to Skeet Latch at [facility] for status update on rollator.

## 2020-08-25 NOTE — Telephone Encounter (Signed)
Theophilus Bones, RN; Sandi Raveling, [place]; Samples, Rise Paganini; [place], [place]; 1 other   This was delivered to patient on Wednesday, March 02 at 11:51 A.M. at [employer]

## 2020-09-14 DIAGNOSIS — R300 Dysuria: Secondary | ICD-10-CM | POA: Diagnosis not present

## 2020-09-14 DIAGNOSIS — R350 Frequency of micturition: Secondary | ICD-10-CM | POA: Diagnosis not present

## 2020-09-24 ENCOUNTER — Encounter: Payer: Medicare Other | Admitting: Internal Medicine

## 2020-10-02 ENCOUNTER — Encounter: Payer: Medicare Other | Admitting: Student

## 2020-10-13 ENCOUNTER — Ambulatory Visit (INDEPENDENT_AMBULATORY_CARE_PROVIDER_SITE_OTHER): Payer: Medicare Other | Admitting: Internal Medicine

## 2020-10-13 ENCOUNTER — Encounter: Payer: Self-pay | Admitting: Internal Medicine

## 2020-10-13 ENCOUNTER — Other Ambulatory Visit: Payer: Self-pay

## 2020-10-13 VITALS — BP 159/80 | HR 77 | Temp 98.4°F | Ht 64.0 in | Wt 191.9 lb

## 2020-10-13 DIAGNOSIS — N1832 Chronic kidney disease, stage 3b: Secondary | ICD-10-CM

## 2020-10-13 DIAGNOSIS — E1122 Type 2 diabetes mellitus with diabetic chronic kidney disease: Secondary | ICD-10-CM | POA: Diagnosis not present

## 2020-10-13 DIAGNOSIS — E782 Mixed hyperlipidemia: Secondary | ICD-10-CM

## 2020-10-13 DIAGNOSIS — I10 Essential (primary) hypertension: Secondary | ICD-10-CM

## 2020-10-13 DIAGNOSIS — I639 Cerebral infarction, unspecified: Secondary | ICD-10-CM | POA: Diagnosis not present

## 2020-10-13 LAB — GLUCOSE, CAPILLARY: Glucose-Capillary: 167 mg/dL — ABNORMAL HIGH (ref 70–99)

## 2020-10-13 LAB — POCT GLYCOSYLATED HEMOGLOBIN (HGB A1C): Hemoglobin A1C: 7.9 % — AB (ref 4.0–5.6)

## 2020-10-13 MED ORDER — LISINOPRIL 10 MG PO TABS: 15.0000 mg | ORAL_TABLET | Freq: Every day | ORAL | 3 refills | Status: DC

## 2020-10-13 NOTE — Assessment & Plan Note (Signed)
Patient is not on any medications for her diabetes, she had previously been on empagliflozin however she stopped taking this because of concerns about urinary tract infections and stated that she would not take the empagliflozin. She also reports that metformin caused her to have itching and did not want to start on this.  On her last visit she started that she would start metformin if her A1c was still elevated today. She reported that she has not been eating well recently, eating a lot of sweets and cakes. Her A1c was 7.9 today, the same as it was in December 2021. We discussed restarting metformin however she stated that she did not want to start this because she was concerned about the itching, discussed that we can try a long acting dose see if this would be tolerable however she stated that she did not want to start this. We discussed the risks of untreated diabetes and the importance of better control. We then discussed starting other such as glipizide or restarting jardiance and she stated that she did not want to start anything since we are going up on her lisinopril, and that she will work on her diet to improve to diabetes.  Patient has a history of medication non-compliance and occasionally stopping medications by herself. She would not be a good candidate for insulin therapy.  -Repeat A1c in 3 months -Continue discussing importance of diabetes control and potentially starting another medication

## 2020-10-13 NOTE — Assessment & Plan Note (Signed)
Patient is currently on lisinopril 10 mg daily, she states that she only takes it a few times per week, she reported concern about having her blood pressure drop too low. She also reports that she thinks her lisinopril causes her headaches. She denies any chest pain, SOB, headaches, vision changes, lightheadedness or dizziness. She states that she checks her blood pressure at home and states that it's always good, however is not able to tell me any of the readings. Her blood pressure today is 175/76, repeat was 159/80. On her last visit she was noted to elevated blood pressure readings, it was recommended that the lisinopril be increased to 20 mg daily however she did not want to have this increased. We discussed the risks of untreated hypertension, especially with her history of cryptogenic stroke and worsening CKD, discussed that it's very important to get better control of her blood pressure and recommended increasing lisinopril to 20 mg daily. She reported that she did not want to increase that much but would be open to increasing to 15 mg daily. We discussed this is not a typical dose however we can try this increase for now. We also discussed that she should monitor her blood pressure daily and keep a record to bring in on her next visit.   -Increase lisinopril to 15 mg daily -RTC in 2-3 weeks -Ambulatory BP monitor -Will need to have continued discussion of BP control and medication adherence

## 2020-10-13 NOTE — Patient Instructions (Signed)
Ms. JARYIAH MEHLMAN,  It was a pleasure to see you today. Thank you for coming in.   Today we discussed your blood pressure. This was elevated today. In regards to this please increase your lisinopril to 15 mg daily (1.5 tablets of the 10 mg). We may have to continue increasing this on your next visit. Please monitor your blood pressure daily, bring in the monitor on your next visit.    We also discussed your diabetes. Your A1c was 7.9, this is elevated. We had discussed starting metformin however you did not want to start this. I would recommend that we start something on your next visit.   Please restart taking the aspirin daily, this is very important given your history of a stroke. Please also consider starting a statin medication like we discussed, your cholesterol is elevated which can put you at risk of further strokes.    Please return to clinic in 2-3 weeks or sooner if needed.   Thank you again for coming in.   Asencion Noble.D.

## 2020-10-13 NOTE — Progress Notes (Signed)
   CC: Hypertension, diabetes, and hx of cryptogenic stroke  HPI:  Wendy [name] is a 70 y.o. with the history listed below presenting for follow up of her hypertension, diabetes, and history of cryptogenic stroke.   Past Medical History:  Diagnosis Date  . Acute right ankle pain 05/25/2018  . Diabetes mellitus   . Hypertension   . Pain in gums 02/25/2020  . Peroneal tendinitis, right leg 05/30/2018  . Pigmented skin lesion of uncertain nature 11/14/9526   Ms. [name] reports a 3 mo hx of a skin lesion, centrally located below her breasts. She is concerned because her sister developed breast cancer after noticing a spot on her breast. Patient reports no bleeding or itching. She states it is not getting progressively larger. She states she accidentally scraped it one day which was painful. She has never had anything like this before. She denies a fami  . Sarcoidosis   . Screening mammogram, encounter for 06/29/2012  . Stroke [facility])    Review of Systems:   Constitutional: Negative for chills and fever.  Respiratory: Negative for shortness of breath.   Cardiovascular: Negative for chest pain and leg swelling.  Gastrointestinal: Negative for abdominal pain, nausea and vomiting.  Neurological: Negative for dizziness and headaches.   Physical Exam:  Vitals:   10/13/20 1427  BP: (!) 175/76  Pulse: 75  Temp: 98.4 F (36.9 C)  TempSrc: Oral  SpO2: 100%  Weight: 191 lb 14.4 oz (87 kg)  Height: 5\' 4"  (1.626 m)   Physical Exam Constitutional:      Appearance: Normal appearance.  HENT:     Mouth/Throat:     Mouth: Mucous membranes are moist.     Pharynx: Oropharynx is clear.  Eyes:     Extraocular Movements: Extraocular movements intact.     Conjunctiva/sclera: Conjunctivae normal.  Cardiovascular:     Rate and Rhythm: Normal rate and regular rhythm.     Pulses: Normal pulses.     Heart sounds: Normal heart sounds.  Pulmonary:     Effort: Pulmonary effort is normal.      Breath sounds: Normal breath sounds.  Abdominal:     General: Abdomen is flat. Bowel sounds are normal.     Palpations: Abdomen is soft.  Musculoskeletal:        General: Normal range of motion.  Skin:    General: Skin is warm and dry.     Capillary Refill: Capillary refill takes less than 2 seconds.  Neurological:     General: No focal deficit present.     Mental Status: She is alert and oriented to person, place, and time.  Psychiatric:        Mood and Affect: Mood normal.        Behavior: Behavior normal.      Assessment & Plan:   See Encounters Tab for problem based charting.  Patient discussed with Dr. Evette Doffing

## 2020-10-13 NOTE — Assessment & Plan Note (Signed)
Patient has a history of HLD, LDL was elevated at 197. We discussed starting a statin medication however patient stated that she did not want to add any other medications. Discussed the importance of better cholesterol control and risks of uncontrolled hyperlipidemia, she expressed understanding and stated that she did not want to take anything.  -Continue to encourage statin therapy

## 2020-10-13 NOTE — Assessment & Plan Note (Signed)
Patient reports not taking her aspirin, she did not want to take this medication any more. She does appear to have some difficulty understanding and has significant resistance to taking her medications, also may have some word finding difficulty. She has not followed up with neurology recently. We discussed the importance of taking aspirin daily which she stated that she would do.  -Restart aspirin daily

## 2020-10-14 NOTE — Progress Notes (Signed)
Internal Medicine Clinic Attending ° °Case discussed with Dr. Krienke  At the time of the visit.  We reviewed the resident’s history and exam and pertinent patient test results.  I agree with the assessment, diagnosis, and plan of care documented in the resident’s note.  °

## 2020-10-30 ENCOUNTER — Encounter: Payer: Self-pay | Admitting: *Deleted

## 2020-11-03 ENCOUNTER — Encounter: Payer: Medicare Other | Admitting: Internal Medicine

## 2020-11-03 NOTE — Progress Notes (Signed)
Things That May Be Affecting Your Health:  Alcohol  Hearing loss  Pain    Depression  Home Safety  Sexual Health  x Diabetes  Lack of physical activity  Stress   Difficulty with daily activities  Loneliness  Tiredness   Drug use x Medicines x Tobacco use   Falls  Motor Vehicle Safety  Weight   Food choices  Oral Health  Other    YOUR PERSONALIZED HEALTH PLAN : 1. Schedule your next subsequent Medicare Wellness visit in one year 2. Attend all of your regular appointments to address your medical issues 3. Complete the preventative screenings and services   Annual Wellness Visit   Medicare Covered Preventative Screenings and [place] Men and Women Who How Often Need? Date of Last Service Action  Abdominal Aortic Aneurysm Adults with AAA risk factors Once      Alcohol Misuse and Counseling All Adults Screening once a year if no alcohol misuse. Counseling up to 4 face to face sessions.     Bone Density Measurement  Adults at risk for osteoporosis Once every 2 yrs      Lipid Panel Z13.6 All adults without CV disease Once every 5 yrs       Colorectal Cancer   Stool sample or  Colonoscopy All adults 95 and older   Once every year  Every 10 years x       Depression All Adults Once a year  Today   Diabetes Screening Blood glucose, post glucose load, or GTT Z13.1  All adults at risk  Pre-diabetics  Once per year  Twice per year      Diabetes  Self-Management Training All adults Diabetics 10 hrs first year; 2 hours subsequent years. Requires Copay     Glaucoma  Diabetics  Family history of glaucoma  African Americans 12 yrs +  Hispanic Americans 80 yrs + Annually - requires coppay      Hepatitis C Z72.89 or F19.20  High Risk for HCV  Born between 1945 and 1965  Annually  Once      HIV Z11.4 All adults based on risk  Annually btw ages 47 & 73 regardless of risk  Annually > 65 yrs if at increased risk      Lung Cancer Screening  Asymptomatic adults aged 10-77 with 30 pack yr history and current smoker OR quit within the last 15 yrs Annually Must have counseling and shared decision making documentation before first screen      Medical Nutrition Therapy Adults with   Diabetes  Renal disease  Kidney transplant within past 3 yrs 3 hours first year; 2 hours subsequent years     Obesity and Counseling All adults Screening once a year Counseling if BMI 30 or higher  Today   Tobacco Use Counseling Adults who use tobacco  Up to 8 visits in one year x    Vaccines Z23  Hepatitis B  Influenza   Pneumonia  Adults   Once  Once every flu season  Two different vaccines separated by one year     Next Annual Wellness Visit People with Medicare Every year  Today     Services & Screenings Women Who How Often Need  Date of Last Service Action  Mammogram  Z12.31 Women over 3 One baseline ages 72-39. Annually ager 40 yrs+ x     Pap tests All women Annually if high risk. Every 2 yrs for normal risk women  Screening for cervical cancer with   Pap (Z01.419 nl or Z01.411abnl) &  HPV Z11.51 Women aged 51 to 33 Once every 5 yrs     Screening pelvic and breast exams All women Annually if high risk. Every 2 yrs for normal risk women     Sexually Transmitted Diseases  Chlamydia  Gonorrhea  Syphilis All at risk adults Annually for non pregnant females at increased risk         [place] Men Who How Ofter Need  Date of Last Service Action  Prostate Cancer - DRE & PSA Men over 50 Annually.  DRE might require a copay.        Sexually Transmitted Diseases  Syphilis All at risk adults Annually for men at increased risk      Health Maintenance List Health Maintenance  Topic Date Due  . COVID-19 Vaccine (1) Never done  . TETANUS/TDAP  Never done  . COLONOSCOPY (Pts 45-49yrs Insurance coverage will need to be confirmed)  Never done  . COLON CANCER SCREENING ANNUAL FOBT  11/20/2009  .  MAMMOGRAM  01/07/2011  . DEXA SCAN  Never done  . PNA vac Low Risk Adult (1 of 2 - PCV13) Never done  . OPHTHALMOLOGY EXAM  10/20/2016  . INFLUENZA VACCINE  01/12/2021  . HEMOGLOBIN A1C  01/13/2021  . FOOT EXAM  02/10/2021  . LIPID PANEL  05/16/2021  . Hepatitis C Screening  Completed  . HPV VACCINES  Aged Out

## 2020-11-04 ENCOUNTER — Ambulatory Visit (INDEPENDENT_AMBULATORY_CARE_PROVIDER_SITE_OTHER): Payer: Medicare Other | Admitting: Internal Medicine

## 2020-11-04 VITALS — BP 161/79 | HR 82 | Temp 98.4°F | Wt 191.3 lb

## 2020-11-04 DIAGNOSIS — I639 Cerebral infarction, unspecified: Secondary | ICD-10-CM

## 2020-11-04 DIAGNOSIS — E782 Mixed hyperlipidemia: Secondary | ICD-10-CM | POA: Diagnosis not present

## 2020-11-04 DIAGNOSIS — N1832 Chronic kidney disease, stage 3b: Secondary | ICD-10-CM | POA: Diagnosis not present

## 2020-11-04 DIAGNOSIS — D860 Sarcoidosis of lung: Secondary | ICD-10-CM

## 2020-11-04 DIAGNOSIS — Z8673 Personal history of transient ischemic attack (TIA), and cerebral infarction without residual deficits: Secondary | ICD-10-CM

## 2020-11-04 DIAGNOSIS — I10 Essential (primary) hypertension: Secondary | ICD-10-CM

## 2020-11-04 MED ORDER — LISINOPRIL 10 MG PO TABS: 20.0000 mg | ORAL_TABLET | Freq: Every day | ORAL | 3 refills | Status: DC

## 2020-11-04 NOTE — Patient Instructions (Signed)
Ms. LENOIR FACCHINI,  It was a pleasure to see you today. Thank you for coming in.   Today we discussed your blood pressure.  This is elevated today.  Please increase the lisinopril to 20 mg daily.  Please continue checking your blood pressures daily.  I am checking some labs today.  We may need to add additional medications in the future.    We also discussed your diabetes.  Please continue working on your diet and exercise.  Please try to start exercising for about 150 minutes/week, this is around 5 30-minute sessions per week.  We will repeat an A1c and we may need to add additional medications in the future.    We also discussed your cholesterol, this is still elevated.  I do still recommend taking the statin medication.  Please continue working on your diet and exercise.  Please consider getting the COVID-vaccine.  You are at high risk given your other medical conditions.  Please return to clinic in 1 month or sooner if needed.   Thank you again for coming in.   Asencion Noble.D.

## 2020-11-04 NOTE — Progress Notes (Signed)
   CC: Hypertension, diabetes, hyperlipidemia  HPI:  Ms.Markell F Anagnos is a 70 y.o. with the history listed below presenting for hypertension, diabetes, and CKD.  Past Medical History:  Diagnosis Date  . Acute right ankle pain 05/25/2018  . Diabetes mellitus   . Hypertension   . Pain in gums 02/25/2020  . Peroneal tendinitis, right leg 05/30/2018  . Pigmented skin lesion of uncertain nature 08/19/8873   Ms. Odriscoll reports a 3 mo hx of a skin lesion, centrally located below her breasts. She is concerned because her sister developed breast cancer after noticing a spot on her breast. Patient reports no bleeding or itching. She states it is not getting progressively larger. She states she accidentally scraped it one day which was painful. She has never had anything like this before. She denies a fami  . Sarcoidosis   . Screening mammogram, encounter for 06/29/2012  . Stroke [facility])    Review of Systems:   Constitutional: Negative for chills and fever.  Respiratory: Negative for shortness of breath.   Cardiovascular: Negative for chest pain and leg swelling.  Gastrointestinal: Negative for abdominal pain, nausea and vomiting.  Neurological: Negative for dizziness and headaches.   Physical Exam:  Vitals:   11/04/20 1520  BP: (!) 161/79  Pulse: 82  Temp: 98.4 F (36.9 C)  TempSrc: Oral  SpO2: 100%  Weight: 191 lb 4.8 oz (86.8 kg)   Physical Exam HENT:     Head: Normocephalic and atraumatic.  Eyes:     Conjunctiva/sclera: Conjunctivae normal.     Pupils: Pupils are equal, round, and reactive to light.  Neck:     Thyroid: No thyromegaly.  Cardiovascular:     Rate and Rhythm: Normal rate and regular rhythm.     Heart sounds: Normal heart sounds. No murmur heard. No friction rub. No gallop.   Pulmonary:     Effort: Pulmonary effort is normal. No respiratory distress.     Breath sounds: Normal breath sounds. No wheezing.  Abdominal:     General: Bowel sounds are normal. There is  no distension.     Palpations: Abdomen is soft.  Musculoskeletal:        General: Normal range of motion.     Cervical back: Normal range of motion and neck supple.  Skin:    General: Skin is warm and dry.     Findings: No erythema.  Neurological:     Mental Status: She is alert and oriented to person, place, and time.     Gait: Gait is intact.  Psychiatric:        Mood and Affect: Mood and affect normal.      Assessment & Plan:   See Encounters Tab for problem based charting.  Patient discussed with Dr. Heber Casco

## 2020-11-05 LAB — BMP8+ANION GAP
Anion Gap: 17 mmol/L (ref 10.0–18.0)
BUN/Creatinine Ratio: 16 (ref 12–28)
BUN: 24 mg/dL (ref 8–27)
CO2: 24 mmol/L (ref 20–29)
Calcium: 9.7 mg/dL (ref 8.7–10.3)
Chloride: 100 mmol/L (ref 96–106)
Creatinine, Ser: 1.5 mg/dL — ABNORMAL HIGH (ref 0.57–1.00)
Glucose: 126 mg/dL — ABNORMAL HIGH (ref 65–99)
Potassium: 4.6 mmol/L (ref 3.5–5.2)
Sodium: 141 mmol/L (ref 134–144)
eGFR: 37 mL/min/{1.73_m2} — ABNORMAL LOW (ref >59)

## 2020-11-05 NOTE — Assessment & Plan Note (Signed)
Patient is currently on lisinopril 15 mg daily, she is very hesitant to add additional medications, stating that she feels like she needs.  She has been checking her blood pressures at home and it is typically around 140s over 80s however will occasionally go up to 138-871L systolic.  She denies any headaches, chest pain,, dizziness, shortness of breath.  We discussed that her blood pressure is still not at goal and that we would like to have additional medications or increase her lisinopril, she was agreeable to increasing lisinopril to 20 mg daily. Blood pressure control is limited due to patient noncompliance and patient occasionally stopping her medications on her own.  Had another discussion about her risk of uncontrolled hypertension, especially given her history of the cryptogenic stroke.  We will need to continue to work with patient to increase or add additional blood pressure medications.  -Increase lisinopril to 20 mg daily -Advised to continue checking BPs at home and bring in meter next visit -Check BMP today -RTC in 1 month

## 2020-11-05 NOTE — Assessment & Plan Note (Signed)
BMP in 12/21 showed creatinine 1.79, normal electrolytes, GFR of 33.  Creatinine before this was around 1.2-1.7.  Has a history of uncontrolled hypertension, diabetes, and hyperlipidemia.  Last microalbumin/creatinine ratio in 8/21 showed a ratio of 132.  Patient is currently on lisinopril daily, she is hesitant to add additional medications however she may benefit from an SGLT2 in the future if she is agreeable.  -Repeat BMP today -Continue to work on diabetes and hypertension control

## 2020-11-05 NOTE — Assessment & Plan Note (Signed)
Patient is now taking aspirin daily, she denies any issues taking the medication.  We discussed the importance of aspirin daily especially with her history of the stroke and other comorbidities that put her at high risk of having a heart attack.  -Continue aspirin daily

## 2020-11-05 NOTE — Assessment & Plan Note (Signed)
Discussed the importance of statin therapy again and her significantly elevated cholesterol level that put her at high risk for multiple complications.  She did not want to take any more medications.  Provided information on weight loss and diet.  -Continue to discuss statin therapy and lifestyle modifications -Consider repeating lipid panel on next visit

## 2020-11-05 NOTE — Assessment & Plan Note (Signed)
Patient reports being diagnosed with sarcoidosis approximately 50 years she states that she lost a lot of weight and was in the hospital for about 1 month.  She does state she had a lung biopsy however does not recall who did this.  She is currently not having any shortness of breath, chest pain, wheezing, cough or other respiratory symptoms.  Chest x-ray done in 2017 showed mild chronic appearing interstitial coarsening, no acute cardiopulmonary findings.  All history is per patient.

## 2020-11-06 NOTE — Progress Notes (Signed)
Internal Medicine Clinic Attending ° °Case discussed with Dr. Krienke  At the time of the visit.  We reviewed the resident’s history and exam and pertinent patient test results.  I agree with the assessment, diagnosis, and plan of care documented in the resident’s note.  °

## 2020-11-08 ENCOUNTER — Encounter: Payer: Self-pay | Admitting: *Deleted

## 2020-11-25 DIAGNOSIS — I10 Essential (primary) hypertension: Secondary | ICD-10-CM | POA: Diagnosis not present

## 2020-11-25 DIAGNOSIS — H2513 Age-related nuclear cataract, bilateral: Secondary | ICD-10-CM | POA: Diagnosis not present

## 2020-11-25 DIAGNOSIS — H35033 Hypertensive retinopathy, bilateral: Secondary | ICD-10-CM | POA: Diagnosis not present

## 2020-11-25 DIAGNOSIS — E113313 Type 2 diabetes mellitus with moderate nonproliferative diabetic retinopathy with macular edema, bilateral: Secondary | ICD-10-CM | POA: Diagnosis not present

## 2020-12-09 DIAGNOSIS — E113313 Type 2 diabetes mellitus with moderate nonproliferative diabetic retinopathy with macular edema, bilateral: Secondary | ICD-10-CM | POA: Diagnosis not present

## 2020-12-16 ENCOUNTER — Encounter: Payer: Self-pay | Admitting: *Deleted

## 2021-01-19 DIAGNOSIS — R079 Chest pain, unspecified: Secondary | ICD-10-CM | POA: Diagnosis not present

## 2021-01-19 DIAGNOSIS — Z20822 Contact with and (suspected) exposure to covid-19: Secondary | ICD-10-CM | POA: Diagnosis not present

## 2021-01-19 DIAGNOSIS — I493 Ventricular premature depolarization: Secondary | ICD-10-CM | POA: Diagnosis not present

## 2021-01-19 DIAGNOSIS — N390 Urinary tract infection, site not specified: Secondary | ICD-10-CM | POA: Diagnosis not present

## 2021-01-19 DIAGNOSIS — G319 Degenerative disease of nervous system, unspecified: Secondary | ICD-10-CM | POA: Diagnosis not present

## 2021-01-19 DIAGNOSIS — R410 Disorientation, unspecified: Secondary | ICD-10-CM | POA: Diagnosis not present

## 2021-01-19 DIAGNOSIS — D509 Iron deficiency anemia, unspecified: Secondary | ICD-10-CM | POA: Diagnosis not present

## 2021-01-19 DIAGNOSIS — R0789 Other chest pain: Secondary | ICD-10-CM | POA: Diagnosis not present

## 2021-01-19 DIAGNOSIS — J323 Chronic sphenoidal sinusitis: Secondary | ICD-10-CM | POA: Diagnosis not present

## 2021-01-20 DIAGNOSIS — I493 Ventricular premature depolarization: Secondary | ICD-10-CM | POA: Diagnosis not present

## 2021-01-20 DIAGNOSIS — R410 Disorientation, unspecified: Secondary | ICD-10-CM | POA: Diagnosis not present

## 2021-01-23 ENCOUNTER — Other Ambulatory Visit: Payer: Self-pay | Admitting: Internal Medicine

## 2021-01-23 DIAGNOSIS — I10 Essential (primary) hypertension: Secondary | ICD-10-CM

## 2021-01-30 DIAGNOSIS — J479 Bronchiectasis, uncomplicated: Secondary | ICD-10-CM | POA: Diagnosis not present

## 2021-01-30 DIAGNOSIS — I2510 Atherosclerotic heart disease of native coronary artery without angina pectoris: Secondary | ICD-10-CM | POA: Diagnosis not present

## 2021-01-30 DIAGNOSIS — K449 Diaphragmatic hernia without obstruction or gangrene: Secondary | ICD-10-CM | POA: Diagnosis not present

## 2021-01-30 DIAGNOSIS — R079 Chest pain, unspecified: Secondary | ICD-10-CM | POA: Diagnosis not present

## 2021-01-30 DIAGNOSIS — J439 Emphysema, unspecified: Secondary | ICD-10-CM | POA: Diagnosis not present

## 2021-02-03 DIAGNOSIS — H3581 Retinal edema: Secondary | ICD-10-CM | POA: Diagnosis not present

## 2021-02-03 DIAGNOSIS — E113313 Type 2 diabetes mellitus with moderate nonproliferative diabetic retinopathy with macular edema, bilateral: Secondary | ICD-10-CM | POA: Diagnosis not present

## 2021-02-10 DIAGNOSIS — E113313 Type 2 diabetes mellitus with moderate nonproliferative diabetic retinopathy with macular edema, bilateral: Secondary | ICD-10-CM | POA: Diagnosis not present

## 2021-02-10 DIAGNOSIS — H3581 Retinal edema: Secondary | ICD-10-CM | POA: Diagnosis not present

## 2021-03-03 DIAGNOSIS — H3581 Retinal edema: Secondary | ICD-10-CM | POA: Diagnosis not present

## 2021-03-03 DIAGNOSIS — E113313 Type 2 diabetes mellitus with moderate nonproliferative diabetic retinopathy with macular edema, bilateral: Secondary | ICD-10-CM | POA: Diagnosis not present

## 2021-03-10 DIAGNOSIS — H35033 Hypertensive retinopathy, bilateral: Secondary | ICD-10-CM | POA: Diagnosis not present

## 2021-03-10 DIAGNOSIS — H2513 Age-related nuclear cataract, bilateral: Secondary | ICD-10-CM | POA: Diagnosis not present

## 2021-03-10 DIAGNOSIS — E113313 Type 2 diabetes mellitus with moderate nonproliferative diabetic retinopathy with macular edema, bilateral: Secondary | ICD-10-CM | POA: Diagnosis not present

## 2021-03-14 ENCOUNTER — Other Ambulatory Visit: Payer: Self-pay | Admitting: Internal Medicine

## 2021-03-14 DIAGNOSIS — I10 Essential (primary) hypertension: Secondary | ICD-10-CM

## 2021-03-18 NOTE — Telephone Encounter (Signed)
Patient made [place] aware back on 01/29/2021 that she transferred her care to [facility].  Her chart was noted and PCP name was removed.

## 2021-03-23 DIAGNOSIS — E7800 Pure hypercholesterolemia, unspecified: Secondary | ICD-10-CM | POA: Diagnosis not present

## 2021-03-23 DIAGNOSIS — M25551 Pain in right hip: Secondary | ICD-10-CM | POA: Diagnosis not present

## 2021-03-23 DIAGNOSIS — Z1211 Encounter for screening for malignant neoplasm of colon: Secondary | ICD-10-CM | POA: Diagnosis not present

## 2021-03-23 DIAGNOSIS — E559 Vitamin D deficiency, unspecified: Secondary | ICD-10-CM | POA: Diagnosis not present

## 2021-03-23 DIAGNOSIS — Z131 Encounter for screening for diabetes mellitus: Secondary | ICD-10-CM | POA: Diagnosis not present

## 2021-03-23 DIAGNOSIS — Z0000 Encounter for general adult medical examination without abnormal findings: Secondary | ICD-10-CM | POA: Diagnosis not present

## 2021-03-23 DIAGNOSIS — R5383 Other fatigue: Secondary | ICD-10-CM | POA: Diagnosis not present

## 2021-03-23 DIAGNOSIS — M129 Arthropathy, unspecified: Secondary | ICD-10-CM | POA: Diagnosis not present

## 2021-03-23 DIAGNOSIS — Z79899 Other long term (current) drug therapy: Secondary | ICD-10-CM | POA: Diagnosis not present

## 2021-04-08 DIAGNOSIS — E7800 Pure hypercholesterolemia, unspecified: Secondary | ICD-10-CM | POA: Diagnosis not present

## 2021-04-08 DIAGNOSIS — I10 Essential (primary) hypertension: Secondary | ICD-10-CM | POA: Diagnosis not present

## 2021-04-08 DIAGNOSIS — E119 Type 2 diabetes mellitus without complications: Secondary | ICD-10-CM | POA: Diagnosis not present

## 2021-04-21 DIAGNOSIS — H2513 Age-related nuclear cataract, bilateral: Secondary | ICD-10-CM | POA: Diagnosis not present

## 2021-04-21 DIAGNOSIS — E113313 Type 2 diabetes mellitus with moderate nonproliferative diabetic retinopathy with macular edema, bilateral: Secondary | ICD-10-CM | POA: Diagnosis not present

## 2021-04-21 DIAGNOSIS — H35033 Hypertensive retinopathy, bilateral: Secondary | ICD-10-CM | POA: Diagnosis not present

## 2021-04-21 DIAGNOSIS — H33321 Round hole, right eye: Secondary | ICD-10-CM | POA: Diagnosis not present

## 2021-04-21 DIAGNOSIS — Z8673 Personal history of transient ischemic attack (TIA), and cerebral infarction without residual deficits: Secondary | ICD-10-CM | POA: Diagnosis not present

## 2021-04-21 DIAGNOSIS — Z7984 Long term (current) use of oral hypoglycemic drugs: Secondary | ICD-10-CM | POA: Diagnosis not present

## 2021-06-02 DIAGNOSIS — Z87891 Personal history of nicotine dependence: Secondary | ICD-10-CM | POA: Diagnosis not present

## 2021-06-02 DIAGNOSIS — Z7984 Long term (current) use of oral hypoglycemic drugs: Secondary | ICD-10-CM | POA: Diagnosis not present

## 2021-06-02 DIAGNOSIS — M79652 Pain in left thigh: Secondary | ICD-10-CM | POA: Diagnosis not present

## 2021-06-02 DIAGNOSIS — I10 Essential (primary) hypertension: Secondary | ICD-10-CM | POA: Diagnosis not present

## 2021-06-02 DIAGNOSIS — I161 Hypertensive emergency: Secondary | ICD-10-CM | POA: Diagnosis not present

## 2021-06-02 DIAGNOSIS — R519 Headache, unspecified: Secondary | ICD-10-CM | POA: Diagnosis not present

## 2021-06-02 DIAGNOSIS — R410 Disorientation, unspecified: Secondary | ICD-10-CM | POA: Diagnosis not present

## 2021-06-02 DIAGNOSIS — M79662 Pain in left lower leg: Secondary | ICD-10-CM | POA: Diagnosis not present

## 2021-06-02 DIAGNOSIS — E119 Type 2 diabetes mellitus without complications: Secondary | ICD-10-CM | POA: Diagnosis not present

## 2021-06-03 DIAGNOSIS — M79662 Pain in left lower leg: Secondary | ICD-10-CM | POA: Diagnosis not present

## 2021-06-10 DIAGNOSIS — E119 Type 2 diabetes mellitus without complications: Secondary | ICD-10-CM | POA: Diagnosis not present

## 2021-06-10 DIAGNOSIS — D510 Vitamin B12 deficiency anemia due to intrinsic factor deficiency: Secondary | ICD-10-CM | POA: Diagnosis not present

## 2021-06-10 DIAGNOSIS — Z0000 Encounter for general adult medical examination without abnormal findings: Secondary | ICD-10-CM | POA: Diagnosis not present

## 2021-06-10 DIAGNOSIS — R350 Frequency of micturition: Secondary | ICD-10-CM | POA: Diagnosis not present

## 2021-06-10 DIAGNOSIS — R5383 Other fatigue: Secondary | ICD-10-CM | POA: Diagnosis not present

## 2021-06-10 DIAGNOSIS — I639 Cerebral infarction, unspecified: Secondary | ICD-10-CM | POA: Diagnosis not present

## 2021-07-09 DIAGNOSIS — R413 Other amnesia: Secondary | ICD-10-CM | POA: Diagnosis not present

## 2021-07-09 DIAGNOSIS — Z87891 Personal history of nicotine dependence: Secondary | ICD-10-CM | POA: Diagnosis not present

## 2021-07-09 DIAGNOSIS — Z1159 Encounter for screening for other viral diseases: Secondary | ICD-10-CM | POA: Diagnosis not present

## 2021-07-09 DIAGNOSIS — I69320 Aphasia following cerebral infarction: Secondary | ICD-10-CM | POA: Diagnosis not present

## 2021-07-23 DIAGNOSIS — R413 Other amnesia: Secondary | ICD-10-CM | POA: Diagnosis not present

## 2021-07-23 DIAGNOSIS — I69320 Aphasia following cerebral infarction: Secondary | ICD-10-CM | POA: Diagnosis not present

## 2021-08-05 DIAGNOSIS — I69320 Aphasia following cerebral infarction: Secondary | ICD-10-CM | POA: Diagnosis not present

## 2021-08-05 DIAGNOSIS — I10 Essential (primary) hypertension: Secondary | ICD-10-CM | POA: Diagnosis not present

## 2021-08-05 DIAGNOSIS — I6523 Occlusion and stenosis of bilateral carotid arteries: Secondary | ICD-10-CM | POA: Diagnosis not present

## 2021-08-06 DIAGNOSIS — R413 Other amnesia: Secondary | ICD-10-CM | POA: Diagnosis not present

## 2021-08-06 DIAGNOSIS — I69320 Aphasia following cerebral infarction: Secondary | ICD-10-CM | POA: Diagnosis not present

## 2021-08-20 DIAGNOSIS — I639 Cerebral infarction, unspecified: Secondary | ICD-10-CM | POA: Diagnosis not present

## 2021-08-20 DIAGNOSIS — I69311 Memory deficit following cerebral infarction: Secondary | ICD-10-CM | POA: Diagnosis not present

## 2021-08-20 DIAGNOSIS — I69320 Aphasia following cerebral infarction: Secondary | ICD-10-CM | POA: Diagnosis not present

## 2021-08-20 DIAGNOSIS — I10 Essential (primary) hypertension: Secondary | ICD-10-CM | POA: Diagnosis not present

## 2021-08-27 DIAGNOSIS — I69320 Aphasia following cerebral infarction: Secondary | ICD-10-CM | POA: Diagnosis not present

## 2021-08-27 DIAGNOSIS — R413 Other amnesia: Secondary | ICD-10-CM | POA: Diagnosis not present

## 2021-09-03 DIAGNOSIS — I69320 Aphasia following cerebral infarction: Secondary | ICD-10-CM | POA: Diagnosis not present

## 2021-09-03 DIAGNOSIS — R413 Other amnesia: Secondary | ICD-10-CM | POA: Diagnosis not present

## 2021-09-09 DIAGNOSIS — Z20822 Contact with and (suspected) exposure to covid-19: Secondary | ICD-10-CM | POA: Diagnosis not present

## 2021-10-01 ENCOUNTER — Ambulatory Visit (INDEPENDENT_AMBULATORY_CARE_PROVIDER_SITE_OTHER): Payer: Medicare Other | Admitting: Emergency Medicine

## 2021-10-01 ENCOUNTER — Encounter: Payer: Self-pay | Admitting: Emergency Medicine

## 2021-10-01 VITALS — BP 134/82 | HR 67 | Temp 98.8°F | Ht 65.0 in | Wt 169.0 lb

## 2021-10-01 DIAGNOSIS — M1712 Unilateral primary osteoarthritis, left knee: Secondary | ICD-10-CM

## 2021-10-01 DIAGNOSIS — I10 Essential (primary) hypertension: Secondary | ICD-10-CM | POA: Diagnosis not present

## 2021-10-01 DIAGNOSIS — R413 Other amnesia: Secondary | ICD-10-CM

## 2021-10-01 DIAGNOSIS — Z8673 Personal history of transient ischemic attack (TIA), and cerebral infarction without residual deficits: Secondary | ICD-10-CM | POA: Diagnosis not present

## 2021-10-01 DIAGNOSIS — D860 Sarcoidosis of lung: Secondary | ICD-10-CM | POA: Diagnosis not present

## 2021-10-01 DIAGNOSIS — E1122 Type 2 diabetes mellitus with diabetic chronic kidney disease: Secondary | ICD-10-CM

## 2021-10-01 DIAGNOSIS — N1832 Chronic kidney disease, stage 3b: Secondary | ICD-10-CM

## 2021-10-01 DIAGNOSIS — Z7689 Persons encountering health services in other specified circumstances: Secondary | ICD-10-CM

## 2021-10-01 DIAGNOSIS — E782 Mixed hyperlipidemia: Secondary | ICD-10-CM | POA: Diagnosis not present

## 2021-10-01 LAB — CBC WITH DIFFERENTIAL/PLATELET
Basophils Absolute: 0 10*3/uL (ref 0.0–0.1)
Basophils Relative: 0.8 % (ref 0.0–3.0)
Eosinophils Absolute: 0.1 10*3/uL (ref 0.0–0.7)
Eosinophils Relative: 2.6 % (ref 0.0–5.0)
HCT: 35.2 % — ABNORMAL LOW (ref 36.0–46.0)
Hemoglobin: 11.8 g/dL — ABNORMAL LOW (ref 12.0–15.0)
Lymphocytes Relative: 22 % (ref 12.0–46.0)
Lymphs Abs: 1.1 10*3/uL (ref 0.7–4.0)
MCHC: 33.4 g/dL (ref 30.0–36.0)
MCV: 83.8 fl (ref 78.0–100.0)
Monocytes Absolute: 0.5 10*3/uL (ref 0.1–1.0)
Monocytes Relative: 9.5 % (ref 3.0–12.0)
Neutro Abs: 3.2 10*3/uL (ref 1.4–7.7)
Neutrophils Relative %: 65.1 % (ref 43.0–77.0)
Platelets: 233 10*3/uL (ref 150.0–400.0)
RBC: 4.2 Mil/uL (ref 3.87–5.11)
RDW: 15.2 % (ref 11.5–15.5)
WBC: 4.9 10*3/uL (ref 4.0–10.5)

## 2021-10-01 LAB — COMPREHENSIVE METABOLIC PANEL
ALT: 9 U/L (ref 0–35)
AST: 21 U/L (ref 0–37)
Albumin: 3.8 g/dL (ref 3.5–5.2)
Alkaline Phosphatase: 75 U/L (ref 39–117)
BUN: 23 mg/dL (ref 6–23)
CO2: 29 mEq/L (ref 19–32)
Calcium: 9.5 mg/dL (ref 8.4–10.5)
Chloride: 105 mEq/L (ref 96–112)
Creatinine, Ser: 1.52 mg/dL — ABNORMAL HIGH (ref 0.40–1.20)
GFR: 34.41 mL/min — ABNORMAL LOW (ref >60.00)
Glucose, Bld: 81 mg/dL (ref 70–99)
Potassium: 4.3 mEq/L (ref 3.5–5.1)
Sodium: 140 mEq/L (ref 135–145)
Total Bilirubin: 0.4 mg/dL (ref 0.2–1.2)
Total Protein: 7.6 g/dL (ref 6.0–8.3)

## 2021-10-01 LAB — LIPID PANEL
Cholesterol: 254 mg/dL — ABNORMAL HIGH (ref 0–200)
HDL: 61.1 mg/dL (ref >39.00)
LDL Cholesterol: 179 mg/dL — ABNORMAL HIGH (ref 0–99)
NonHDL: 193.08
Total CHOL/HDL Ratio: 4
Triglycerides: 70 mg/dL (ref 0.0–149.0)
VLDL: 14 mg/dL (ref 0.0–40.0)

## 2021-10-01 LAB — HEMOGLOBIN A1C: Hgb A1c MFr Bld: 5.8 % (ref 4.6–6.5)

## 2021-10-01 MED ORDER — LISINOPRIL 10 MG PO TABS: 10.0000 mg | ORAL_TABLET | Freq: Every day | ORAL | 3 refills | Status: DC

## 2021-10-01 MED ORDER — ROSUVASTATIN CALCIUM 10 MG PO TABS: 10.0000 mg | ORAL_TABLET | Freq: Every day | ORAL | 3 refills | Status: DC

## 2021-10-01 NOTE — Assessment & Plan Note (Signed)
Stable.  Lipid profile done today.  Recommend to start rosuvastatin 10 mg daily. ?

## 2021-10-01 NOTE — Assessment & Plan Note (Signed)
Residual deficit from previous strokes.  Needs neurology evaluation.  Referral placed today. ?

## 2021-10-01 NOTE — Assessment & Plan Note (Signed)
Well-controlled on daily glipizide 5 mg. ?Hemoglobin A1c done today. ?

## 2021-10-01 NOTE — Assessment & Plan Note (Signed)
Advised to stay well-hydrated and avoid NSAIDs. ?

## 2021-10-01 NOTE — Assessment & Plan Note (Signed)
Well-controlled hypertension. ?Continue lisinopril 10 mg daily. ?BP Readings from Last 3 Encounters:  ?10/01/21 134/82  ?11/04/20 (!) 161/79  ?10/13/20 (!) 159/80  ? ? ?

## 2021-10-01 NOTE — Assessment & Plan Note (Addendum)
Secondary prevention discussed.  Patient on Plavix 75 mg daily. ?Start rosuvastatin 10 mg daily. ?Importance of diabetes and hypertension control discussed. ?Refer to neurologist. ?

## 2021-10-01 NOTE — Progress Notes (Signed)
Deedra Ehrich ?71 y.o. ? ? ?Chief Complaint  ?Patient presents with  ? New patient  ?  Would like to discuss memory loss  ? ? ?HISTORY OF PRESENT ILLNESS: ?This is a 71 y.o. female first visit to this office, here to establish care with me. ?Accompanied by husband and son. ?Has the following chronic medical problems: ?1.  History of stroke with memory loss.  Presently on Plavix 75 mcg daily ?2.  History of hypertension on lisinopril 10 mg daily ?3.  History of diabetes and diabetic retinopathy.  Presently on glipizide.  Intolerant to metformin. ?4.  Chronic kidney disease stage IIIb ?5.  History of sarcoidosis ?Recent visit with speech therapist from [place] system assessment and plan as follows: ?Assessment: ?Patient and son, Gerald Stabs, arrived at the visit and patient reported having a headache but was feeling better by the end of the session. ST re-explained the goals of the memory book and patient, son and ST collaborated to add to the memory book and make edits to information collected last week from patient. Patient independently reported she enjoys watching television, going to restaurants and going to the park with her grandchildren. Patient's son provided detailed responses in regard to activities she enjoys, favorite restaurants, television shows, friends and music she enjoys. ST educated about the goal of the memory book and gave patient and son homework to find pictures and identify any other important topics to add to her memory book. Patient was more engaged and word finding was better this week as she is getting more comfortable and her son was present.  ? ?Plan: ?Continue with current treatment plan ? ?Charlynne Cousins Ward, MA CCC-SLP ? ?HPI ? ? ?Prior to Admission medications   ?Medication Sig Start Date End Date Taking? Authorizing Provider  ?cholecalciferol (VITAMIN D3) 25 MCG (1000 UT) tablet Take 1,000 Units by mouth daily.   Yes [provider]  ?clopidogrel (PLAVIX) 75 MG tablet  Take 1 tablet by mouth daily. 07/24/21  Yes [provider]  ?glucose blood (ACCU-CHEK GUIDE) test strip Check blood sugar up to 1 time per day as instructed 03/25/20  Yes Marianna Payment, MD  ?lisinopril (ZESTRIL) 10 MG tablet Take 2 tablets by mouth once daily 01/26/21  Yes Marianna Payment, MD  ?Accu-Chek Softclix Lancets lancets Check blood sugar up to 1 time a day as instructed 03/25/20   Marianna Payment, MD  ?Blood Glucose Monitoring Suppl (ACCU-CHEK GUIDE) w/Device KIT 1 each by Does not apply route 2 (two) times daily. 01/18/18   Axel Filler, MD  ?CINNAMON PO Take 1 capsule by mouth daily. ?Patient not taking: Reported on 10/01/2021    [provider]  ?clopidogrel (PLAVIX) 75 MG tablet Take 75 mg by mouth daily. 07/24/21   [provider]  ?ELDERBERRY PO Take 1 capsule by mouth daily. ?Patient not taking: Reported on 10/01/2021    [provider]  ?glipiZIDE (GLUCOTROL) 5 MG tablet Take 5 mg by mouth daily. 06/10/21   [provider]  ?Lancet Device MISC For True Track meter - use to check blood sugars once a week. Dx code: 43.00. 03/20/20   Marianna Payment, MD  ?Nutritional Supplements (NUTRITIONAL SUPPLEMENT PO) Take 1 tablet by mouth daily. High Blood Sugar. ?Patient not taking: Reported on 10/01/2021    [provider]  ?OVER THE COUNTER MEDICATION Take 0.5 drops by mouth daily. Silver supplement ?Patient not taking: Reported on 10/01/2021    [provider]  ?[place]  Take 1 capsule by mouth daily. Coconut supplement ?Patient not taking: Reported on 10/01/2021    [provider]  ? ? ?Allergies  ?Allergen Reactions  ? Metformin And Related Nausea Only  ?  "severe itching"  ? ? ?Patient Active Problem List  ? Diagnosis Date Noted  ? Health care maintenance 02/28/2019  ? History of cardioembolic cerebrovascular accident (CVA) 10/20/2018  ? Unilateral primary osteoarthritis, right hip 05/30/2018  ? Osteoarthritis 03/10/2018   ? Hx of stroke without residual deficits 09/26/2015  ? CKD (chronic kidney disease) stage 3, GFR 30-59 ml/min (HCC) 10/14/2014  ? Background diabetic retinopathy(362.01) 07/25/2012  ? Sarcoidosis of lung ([place]) 09/30/2011  ? Hyperlipidemia 08/10/2011  ? Type 2 diabetes mellitus ([place]) 06/26/2008  ? Essential hypertension, benign 06/26/2008  ? Primary localized osteoarthritis of left knee 06/26/2008  ? ? ?Past Medical History:  ?Diagnosis Date  ? Acute right ankle pain 05/25/2018  ? Diabetes mellitus   ? Hypertension   ? Pain in gums 02/25/2020  ? Peroneal tendinitis, right leg 05/30/2018  ? Pigmented skin lesion of uncertain nature 3/0/8657  ? Ms. Asaro reports a 3 mo hx of a skin lesion, centrally located below her breasts. She is concerned because her sister developed breast cancer after noticing a spot on her breast. Patient reports no bleeding or itching. She states it is not getting progressively larger. She states she accidentally scraped it one day which was painful. She has never had anything like this before. She denies a fami  ? Sarcoidosis   ? Screening mammogram, encounter for 06/29/2012  ? Stroke [facility])   ? ? ?Past Surgical History:  ?Procedure Laterality Date  ? EP IMPLANTABLE DEVICE N/A 09/29/2015  ? Procedure: Loop Recorder Insertion;  Surgeon: Evans Lance, MD;  Location: [place] [facility];  Service: Cardiovascular;  Laterality: N/A;  ? INTRAOCULAR LENS INSERTION    ? TEE WITHOUT CARDIOVERSION N/A 09/29/2015  ? Procedure: TRANSESOPHAGEAL ECHOCARDIOGRAM (TEE);  Surgeon: Larey Dresser, MD;  Location: [place];  Service: Cardiovascular;  Laterality: N/A;  ? ? ?Social History  ? ?Socioeconomic History  ? Marital status: Married  ?  Spouse name: Not on file  ? Number of children: Not on file  ? Years of education: Not on file  ? Highest education level: Not on file  ?Occupational History  ? Not on file  ?Tobacco Use  ? Smoking status: Former  ?  Types: Cigarettes  ?  Quit date: 08/09/1976  ?  Years  since quitting: 45.1  ? Smokeless tobacco: Never  ?Substance and Sexual Activity  ? Alcohol use: No  ?  Alcohol/week: 0.0 standard drinks  ? Drug use: No  ? Sexual activity: Not on file  ?Other Topics Concern  ? Not on file  ?Social History Narrative  ? Not on file  ? ?Social Determinants of Health  ? ?Financial Resource Strain: Not on file  ?Food Insecurity: Not on file  ?Transportation Needs: Not on file  ?Physical Activity: Not on file  ?Stress: Not on file  ?Social Connections: Not on file  ?Intimate Partner Violence: Not on file  ? ? ?Family History  ?Problem Relation Age of Onset  ? Hypertension Son   ? ? ? ?Review of Systems  ?Constitutional: Negative.  Negative for chills and fever.  ?HENT: Negative.  Negative for congestion and sore throat.   ?Respiratory: Negative.  Negative for cough and shortness of breath.   ?Cardiovascular: Negative.  Negative for chest pain and palpitations.  ?  Genitourinary: Negative.  Negative for flank pain.  ?Musculoskeletal:  Positive for joint pain.  ?Skin: Negative.  Negative for rash.  ?Neurological: Negative.  Negative for dizziness and headaches.  ?     Memory problems  ?All other systems reviewed and are negative. ? ?Today's Vitals  ? 10/01/21 1010  ?BP: 134/82  ?Pulse: 67  ?Temp: 98.8 ?F (37.1 ?C)  ?TempSrc: Oral  ?SpO2: 96%  ?Weight: 169 lb (76.7 kg)  ?Height: 5' 5"  (1.651 m)  ? ?Body mass index is 28.12 kg/m?. ? ?Physical Exam ?Vitals reviewed.  ?Constitutional:   ?   Appearance: Normal appearance.  ?HENT:  ?   Head: Normocephalic.  ?Eyes:  ?   Extraocular Movements: Extraocular movements intact.  ?   Pupils: Pupils are equal, round, and reactive to light.  ?Cardiovascular:  ?   Rate and Rhythm: Normal rate and regular rhythm.  ?   Pulses: Normal pulses.  ?   Heart sounds: Normal heart sounds.  ?Pulmonary:  ?   Effort: Pulmonary effort is normal.  ?   Breath sounds: Normal breath sounds.  ?Abdominal:  ?   Palpations: Abdomen is soft.  ?   Tenderness: There is no abdominal  tenderness.  ?Musculoskeletal:     ?   General: Normal range of motion.  ?   Cervical back: No tenderness.  ?Lymphadenopathy:  ?   Cervical: No cervical adenopathy.  ?Skin: ?   General: Skin is warm and

## 2021-10-01 NOTE — Assessment & Plan Note (Signed)
Chronic pain.  Advised to take Tylenol and avoid NSAIDs due to chronic kidney disease and anticoagulation with Plavix. ?

## 2021-10-01 NOTE — Patient Instructions (Signed)
Health Maintenance After Age 71 After age 71, you are at a higher risk for certain long-term diseases and infections as well as injuries from falls. Falls are a major cause of broken bones and head injuries in people who are older than age 71. Getting regular preventive care can help to keep you healthy and well. Preventive care includes getting regular testing and making lifestyle changes as recommended by your health care provider. Talk with your health care provider about: Which screenings and tests you should have. A screening is a test that checks for a disease when you have no symptoms. A diet and exercise plan that is right for you. What should I know about screenings and tests to prevent falls? Screening and testing are the best ways to find a health problem early. Early diagnosis and treatment give you the best chance of managing medical conditions that are common after age 71. Certain conditions and lifestyle choices may make you more likely to have a fall. Your health care provider may recommend: Regular vision checks. Poor vision and conditions such as cataracts can make you more likely to have a fall. If you wear glasses, make sure to get your prescription updated if your vision changes. Medicine review. Work with your health care provider to regularly review all of the medicines you are taking, including over-the-counter medicines. Ask your health care provider about any side effects that may make you more likely to have a fall. Tell your health care provider if any medicines that you take make you feel dizzy or sleepy. Strength and balance checks. Your health care provider may recommend certain tests to check your strength and balance while standing, walking, or changing positions. Foot health exam. Foot pain and numbness, as well as not wearing proper footwear, can make you more likely to have a fall. Screenings, including: Osteoporosis screening. Osteoporosis is a condition that causes  the bones to get weaker and break more easily. Blood pressure screening. Blood pressure changes and medicines to control blood pressure can make you feel dizzy. Depression screening. You may be more likely to have a fall if you have a fear of falling, feel depressed, or feel unable to do activities that you used to do. Alcohol use screening. Using too much alcohol can affect your balance and may make you more likely to have a fall. Follow these instructions at home: Lifestyle Do not drink alcohol if: Your health care provider tells you not to drink. If you drink alcohol: Limit how much you have to: 0-1 drink a day for women. 0-2 drinks a day for men. Know how much alcohol is in your drink. In the [place] one drink equals one 12 oz bottle of beer (355 mL), one 5 oz glass of wine (148 mL), or one 1 oz glass of hard liquor (44 mL). Do not use any products that contain nicotine or tobacco. These products include cigarettes, chewing tobacco, and vaping devices, such as e-cigarettes. If you need help quitting, ask your health care provider. Activity  Follow a regular exercise program to stay fit. This will help you maintain your balance. Ask your health care provider what types of exercise are appropriate for you. If you need a cane or walker, use it as recommended by your health care provider. Wear supportive shoes that have nonskid soles. Safety  Remove any tripping hazards, such as rugs, cords, and clutter. Install safety equipment such as grab bars in bathrooms and safety rails on stairs. Keep rooms and walkways   well-lit. General instructions Talk with your health care provider about your risks for falling. Tell your health care provider if: You fall. Be sure to tell your health care provider about all falls, even ones that seem minor. You feel dizzy, tiredness (fatigue), or off-balance. Take over-the-counter and prescription medicines only as told by your health care provider. These include  supplements. Eat a healthy diet and maintain a healthy weight. A healthy diet includes low-fat dairy products, low-fat (lean) meats, and fiber from whole grains, beans, and lots of fruits and vegetables. Stay current with your vaccines. Schedule regular health, dental, and eye exams. Summary Having a healthy lifestyle and getting preventive care can help to protect your health and wellness after age 71. Screening and testing are the best way to find a health problem early and help you avoid having a fall. Early diagnosis and treatment give you the best chance for managing medical conditions that are more common for people who are older than age 71. Falls are a major cause of broken bones and head injuries in people who are older than age 71. Take precautions to prevent a fall at home. Work with your health care provider to learn what changes you can make to improve your health and wellness and to prevent falls. This information is not intended to replace advice given to you by your health care provider. Make sure you discuss any questions you have with your health care provider. Document Revised: 10/20/2020 Document Reviewed: 10/20/2020 Elsevier Patient Education  2023 [employer]vier Inc.  

## 2021-10-01 NOTE — Assessment & Plan Note (Signed)
Stable and asymptomatic  

## 2021-10-02 DIAGNOSIS — Z20822 Contact with and (suspected) exposure to covid-19: Secondary | ICD-10-CM | POA: Diagnosis not present

## 2021-10-13 DIAGNOSIS — Z20822 Contact with and (suspected) exposure to covid-19: Secondary | ICD-10-CM | POA: Diagnosis not present

## 2021-11-20 DIAGNOSIS — R410 Disorientation, unspecified: Secondary | ICD-10-CM | POA: Diagnosis not present

## 2021-11-20 DIAGNOSIS — R4701 Aphasia: Secondary | ICD-10-CM | POA: Diagnosis not present

## 2021-11-20 DIAGNOSIS — I639 Cerebral infarction, unspecified: Secondary | ICD-10-CM | POA: Diagnosis not present

## 2022-04-22 ENCOUNTER — Encounter (INDEPENDENT_AMBULATORY_CARE_PROVIDER_SITE_OTHER): Payer: Medicare Other | Admitting: Ophthalmology

## 2022-05-10 ENCOUNTER — Encounter (INDEPENDENT_AMBULATORY_CARE_PROVIDER_SITE_OTHER): Payer: Medicare Other | Admitting: Ophthalmology

## 2022-05-27 ENCOUNTER — Emergency Department (HOSPITAL_BASED_OUTPATIENT_CLINIC_OR_DEPARTMENT_OTHER): Payer: Medicare Other

## 2022-05-27 ENCOUNTER — Emergency Department (HOSPITAL_BASED_OUTPATIENT_CLINIC_OR_DEPARTMENT_OTHER)
Admission: EM | Admit: 2022-05-27 | Discharge: 2022-05-28 | Disposition: A | Discharge status: Home / Self Care | Payer: Medicare Other | Attending: Emergency Medicine | Admitting: Emergency Medicine

## 2022-05-27 ENCOUNTER — Encounter (HOSPITAL_BASED_OUTPATIENT_CLINIC_OR_DEPARTMENT_OTHER): Payer: Self-pay

## 2022-05-27 ENCOUNTER — Emergency Department (HOSPITAL_COMMUNITY): Payer: Medicare Other

## 2022-05-27 ENCOUNTER — Other Ambulatory Visit: Payer: Self-pay

## 2022-05-27 DIAGNOSIS — R519 Headache, unspecified: Secondary | ICD-10-CM | POA: Insufficient documentation

## 2022-05-27 DIAGNOSIS — Z8673 Personal history of transient ischemic attack (TIA), and cerebral infarction without residual deficits: Secondary | ICD-10-CM | POA: Diagnosis not present

## 2022-05-27 DIAGNOSIS — Z7901 Long term (current) use of anticoagulants: Secondary | ICD-10-CM | POA: Diagnosis not present

## 2022-05-27 DIAGNOSIS — R4701 Aphasia: Secondary | ICD-10-CM

## 2022-05-27 LAB — CBC WITH DIFFERENTIAL/PLATELET
Abs Immature Granulocytes: 0.01 10*3/uL (ref 0.00–0.07)
Basophils Absolute: 0.1 10*3/uL (ref 0.0–0.1)
Basophils Relative: 1 %
Eosinophils Absolute: 0.1 10*3/uL (ref 0.0–0.5)
Eosinophils Relative: 1 %
HCT: 35.5 % — ABNORMAL LOW (ref 36.0–46.0)
Hemoglobin: 11.7 g/dL — ABNORMAL LOW (ref 12.0–15.0)
Immature Granulocytes: 0 %
Lymphocytes Relative: 23 %
Lymphs Abs: 1.4 10*3/uL (ref 0.7–4.0)
MCH: 27.7 pg (ref 26.0–34.0)
MCHC: 33 g/dL (ref 30.0–36.0)
MCV: 84.1 fL (ref 80.0–100.0)
Monocytes Absolute: 0.4 10*3/uL (ref 0.1–1.0)
Monocytes Relative: 7 %
Neutro Abs: 4 10*3/uL (ref 1.7–7.7)
Neutrophils Relative %: 68 %
Platelets: 272 10*3/uL (ref 150–400)
RBC: 4.22 MIL/uL (ref 3.87–5.11)
RDW: 14.1 % (ref 11.5–15.5)
WBC: 5.9 10*3/uL (ref 4.0–10.5)
nRBC: 0 % (ref 0.0–0.2)

## 2022-05-27 LAB — SEDIMENTATION RATE: Sed Rate: 90 mm/hr — ABNORMAL HIGH (ref 0–22)

## 2022-05-27 LAB — BASIC METABOLIC PANEL
Anion gap: 10 (ref 5–15)
BUN: 21 mg/dL (ref 8–23)
CO2: 28 mmol/L (ref 22–32)
Calcium: 9.6 mg/dL (ref 8.9–10.3)
Chloride: 101 mmol/L (ref 98–111)
Creatinine, Ser: 1.37 mg/dL — ABNORMAL HIGH (ref 0.44–1.00)
GFR, Estimated: 41 mL/min — ABNORMAL LOW (ref >60)
Glucose, Bld: 124 mg/dL — ABNORMAL HIGH (ref 70–99)
Potassium: 3.4 mmol/L — ABNORMAL LOW (ref 3.5–5.1)
Sodium: 139 mmol/L (ref 135–145)

## 2022-05-27 LAB — TROPONIN I (HIGH SENSITIVITY): Troponin I (High Sensitivity): 4 ng/L (ref <18)

## 2022-05-27 MED ORDER — IOHEXOL 350 MG/ML SOLN
100.0000 mL | Freq: Once | INTRAVENOUS | Status: AC | PRN
  Administered 2022-05-27: 75 mL via INTRAVENOUS

## 2022-05-27 MED ORDER — LORAZEPAM 2 MG/ML IJ SOLN
1.0000 mg | Freq: Once | INTRAMUSCULAR | Status: AC | PRN
  Administered 2022-05-27: 1 mg via INTRAVENOUS
  Filled 2022-05-27: qty 1

## 2022-05-27 NOTE — ED Provider Notes (Signed)
  10:31 PM Arrives from [facility] for MRI brain.  Patient stable.  She does report claustrophobia in small spaces, will give dose of ativan prior to MRI.  Per neuro, if no acute findings should be stable for d/c to follow-up with OP neuro.  12:45 AM MRI negative for acute findings.  Sed rate elevated, however no complaint of headache while here in the ED.  She does not have any tenderness to the temples.  Doubt this represents temporal arteritis.  She will be discharged to follow-up with her neurologist as per neuro recs.  Family agreeable.  Can return here for new concerns.   Larene Pickett, PA-C 05/28/22 0049    Tegeler, Gwenyth Allegra, MD 05/28/22 1444

## 2022-05-27 NOTE — ED Notes (Signed)
Pt transferred from [facility] via [facility] for follow up on possible CVA. MRI ordered.

## 2022-05-27 NOTE — ED Notes (Signed)
[facility] here to transport pt 

## 2022-05-27 NOTE — ED Provider Notes (Signed)
[facility] EMERGENCY DEPT Provider Note   CSN: [id number] Arrival date & time: 05/27/22  1623     History  Chief Complaint  Patient presents with   Headache    Wendy [name] is a 71 y.o. female.  Patient with a history of previous stroke with residual expressive aphasia.  Level 5 caveat for such.  She is here with intermittent headache for the past 3 days and nosebleeding.  She is not having either 1 currently.  Family's concern for worsening aphasia over the past several days.  They are concerned about another possible stroke.  She is on Plavix but no other anticoagulation.  She describes pain to the front of her head that comes and goes lasting for several hours at a time.  Denies thunderclap onset.  No associated nausea, vomiting, chest pain or shortness of breath.  No increased weakness from her baseline.  No new numbness or tingling.  No visual changes.  No history of migraine headaches.  Family states headaches presented to stroke previously.  The history is provided by the patient and a relative.  Headache Associated symptoms: no abdominal pain, no congestion, no cough, no fever, no myalgias, no nausea and no vomiting        Home Medications Prior to Admission medications   Medication Sig Start Date End Date Taking? Authorizing Provider  Accu-Chek Softclix Lancets lancets Check blood sugar up to 1 time a day as instructed 03/25/20   Marianna Payment, MD  Blood Glucose Monitoring Suppl (ACCU-CHEK GUIDE) w/Device KIT 1 each by Does not apply route 2 (two) times daily. 01/18/18   Axel Filler, MD  cholecalciferol (VITAMIN D3) 25 MCG (1000 UT) tablet Take 1,000 Units by mouth daily.    [provider]  CINNAMON PO Take 1 capsule by mouth daily. Patient not taking: Reported on 10/01/2021    [provider]  clopidogrel (PLAVIX) 75 MG tablet Take 75 mg by mouth daily. 07/24/21   [provider]  clopidogrel (PLAVIX) 75 MG tablet Take  1 tablet by mouth daily. 07/24/21   [provider]  ELDERBERRY PO Take 1 capsule by mouth daily. Patient not taking: Reported on 10/01/2021    [provider]  glipiZIDE (GLUCOTROL) 5 MG tablet Take 5 mg by mouth daily. 06/10/21   [provider]  glucose blood (ACCU-CHEK GUIDE) test strip Check blood sugar up to 1 time per day as instructed 03/25/20   Marianna Payment, MD  Lancet Device MISC For True Track meter - use to check blood sugars once a week. Dx code: 250.00. 03/20/20   Marianna Payment, MD  lisinopril (ZESTRIL) 10 MG tablet Take 1 tablet (10 mg total) by mouth daily. 10/01/21 09/26/22  Horald Pollen, MD  Nutritional Supplements (NUTRITIONAL SUPPLEMENT PO) Take 1 tablet by mouth daily. High Blood Sugar. Patient not taking: Reported on 10/01/2021    [provider]  OVER THE COUNTER MEDICATION Take 0.5 drops by mouth daily. Silver supplement Patient not taking: Reported on 10/01/2021    [provider]  OVER THE COUNTER MEDICATION Take 1 capsule by mouth daily. Coconut supplement Patient not taking: Reported on 10/01/2021    [provider]  rosuvastatin (CRESTOR) 10 MG tablet Take 1 tablet (10 mg total) by mouth daily. 10/01/21   Horald Pollen, MD      Allergies    Metformin and related    Review of Systems   Review of Systems  Constitutional:  Negative for activity  change, appetite change and fever.  HENT:  Negative for congestion.   Eyes:  Negative for visual disturbance.  Respiratory:  Negative for cough, chest tightness and shortness of breath.   Cardiovascular:  Negative for chest pain.  Gastrointestinal:  Negative for abdominal pain, nausea and vomiting.  Genitourinary:  Negative for dysuria and hematuria.  Musculoskeletal:  Negative for arthralgias and myalgias.  Skin:  Negative for rash.  Neurological:  Positive for speech difficulty and headaches.   all other systems are negative except as noted in the HPI and  PMH.    Physical Exam Updated Vital Signs BP (!) 186/77   Pulse 89   Temp 98.6 F (37 C) (Oral)   Resp 17   Ht _0  (1.651 m)   Wt 76.7 kg   SpO2 99%   BMI 28.14 kg/m  Physical Exam Vitals and nursing note reviewed.  Constitutional:      General: She is not in acute distress.    Appearance: She is well-developed.     Comments: Restive aphasia worse than baseline per family  HENT:     Head: Normocephalic and atraumatic.     Mouth/Throat:     Pharynx: No oropharyngeal exudate.  Eyes:     Conjunctiva/sclera: Conjunctivae normal.     Pupils: Pupils are equal, round, and reactive to light.  Neck:     Comments: No meningismus. Cardiovascular:     Rate and Rhythm: Normal rate and regular rhythm.     Heart sounds: Normal heart sounds. No murmur heard. Pulmonary:     Effort: Pulmonary effort is normal. No respiratory distress.     Breath sounds: Normal breath sounds.  Chest:     Chest wall: No tenderness.  Abdominal:     Palpations: Abdomen is soft.     Tenderness: There is no abdominal tenderness. There is no guarding or rebound.  Musculoskeletal:        General: No tenderness. Normal range of motion.     Cervical back: Normal range of motion and neck supple.  Skin:    General: Skin is warm.  Neurological:     Mental Status: She is alert and oriented to person, place, and time.     Cranial Nerves: No cranial nerve deficit.     Motor: No abnormal muscle tone.     Coordination: Coordination normal.     Comments: Cranial nerves II to XII intact.  5/5 strength throughout.  Some expressive aphasia worse than baseline per family at bedside.  Psychiatric:        Behavior: Behavior normal.     ED Results / Procedures / Treatments   Labs (all labs ordered are listed, but only abnormal results are displayed) Labs Reviewed  CBC WITH DIFFERENTIAL/PLATELET - Abnormal; Notable for the following components:      Result Value   Hemoglobin 11.7 (*)    HCT 35.5 (*)    All  other components within normal limits  BASIC METABOLIC PANEL - Abnormal; Notable for the following components:   Potassium 3.4 (*)    Glucose, Bld 124 (*)    Creatinine, Ser 1.37 (*)    GFR, Estimated 41 (*)    All other components within normal limits  SEDIMENTATION RATE - Abnormal; Notable for the following components:   Sed Rate 90 (*)    All other components within normal limits  TROPONIN I (HIGH SENSITIVITY)  TROPONIN I (HIGH SENSITIVITY)    EKG EKG Interpretation  Date/Time:  Thursday May 27 2022 18:14:39 EST Ventricular Rate:  85 PR Interval:  167 QRS Duration: 89 QT Interval:  408 QTC Calculation: 486 R Axis:   49 Text Interpretation: Sinus rhythm Borderline prolonged QT interval No significant change was found Confirmed by Ezequiel Essex [phone]) on 05/27/2022 6:19:45 PM  Radiology CT ANGIO HEAD NECK W WO CM  Result Date: 05/27/2022 CLINICAL DATA:  Headache and nose bleed EXAM: CT ANGIOGRAPHY HEAD AND NECK TECHNIQUE: Multidetector CT imaging of the head and neck was performed using the standard protocol during bolus administration of intravenous contrast. Multiplanar CT image reconstructions and MIPs were obtained to evaluate the vascular anatomy. Carotid stenosis measurements (when applicable) are obtained utilizing NASCET criteria, using the distal internal carotid diameter as the denominator. RADIATION DOSE REDUCTION: This exam was performed according to the departmental dose-optimization program which includes automated exposure control, adjustment of the mA and/or kV according to patient size and/or use of iterative reconstruction technique. CONTRAST:  42m OMNIPAQUE IOHEXOL 350 MG/ML SOLN COMPARISON:  MRA head 10/21/2018 FINDINGS: CT HEAD FINDINGS Brain: There is no mass, hemorrhage or extra-axial collection. There is generalized atrophy without lobar predilection. Old left parietal infarct. There is hypoattenuation of the periventricular white matter, most commonly  indicating chronic ischemic microangiopathy. Skull: The visualized skull base, calvarium and extracranial soft tissues are normal. Sinuses/Orbits: No fluid levels or advanced mucosal thickening of the visualized paranasal sinuses. No mastoid or middle ear effusion. The orbits are normal. CTA NECK FINDINGS SKELETON: There is no bony spinal canal stenosis. No lytic or blastic lesion. OTHER NECK: Normal pharynx, larynx and major salivary glands. No cervical lymphadenopathy. Unremarkable thyroid gland. UPPER CHEST: Biapical interstitial opacities, likely fibrosis. AORTIC ARCH: There is no calcific atherosclerosis of the aortic arch. There is no aneurysm, dissection or hemodynamically significant stenosis of the visualized portion of the aorta. Conventional 3 vessel aortic branching pattern. The visualized proximal subclavian arteries are widely patent. RIGHT CAROTID SYSTEM: Normal without aneurysm, dissection or stenosis. LEFT CAROTID SYSTEM: Normal without aneurysm, dissection or stenosis. VERTEBRAL ARTERIES: Codominant configuration. Both origins are clearly patent. There is no dissection, occlusion or flow-limiting stenosis to the skull base (V1-V3 segments). CTA HEAD FINDINGS POSTERIOR CIRCULATION: --Vertebral arteries: Normal V4 segments. --Inferior cerebellar arteries: Normal. --Basilar artery: Normal. --Superior cerebellar arteries: Normal. --Posterior cerebral arteries (PCA): Normal. ANTERIOR CIRCULATION: --Intracranial internal carotid arteries: Atherosclerotic calcification of the internal carotid arteries at the skull base without hemodynamically significant stenosis. --Anterior cerebral arteries (ACA): Normal. Both A1 segments are present. Patent anterior communicating artery (a-comm). --Middle cerebral arteries (MCA): There is a new occlusion of the proximal right MCA M2 segment inferior division (series 10, image 33). There is chronic high-grade stenosis of the distal left MCA M1 segment. VENOUS SINUSES:  As permitted by contrast timing, patent. ANATOMIC VARIANTS: None Review of the MIP images confirms the above findings. IMPRESSION: 1. New occlusion of the proximal right MCA M2 segment inferior division. 2. Chronic high-grade stenosis of the distal left MCA M1 segment. 3. Old left parietal infarct and findings of chronic ischemic microangiopathy. These results were called by telephone at the time of interpretation on 05/27/2022 at 8:10 pm [facility], who verbally acknowledged these results. Electronically Signed   By: KUlyses JarredM.D.   On: 05/27/2022 20:10    Procedures Procedures    Medications Ordered in ED Medications - No data to display  ED Course/ Medical Decision Making/ A&P  Medical Decision Making Amount and/or Complexity of Data Reviewed Labs: ordered. Radiology: ordered. ECG/medicine tests: ordered.  Risk Prescription drug management.   Ongoing headaches for the past several days family concern for new aphasia.  Neurological exam otherwise nonfocal.  Code stroke not activated due to delay in presentation  Some increased aphasia compared to baseline per family at bedside.  Blood pressure elevated but denies any headache or nosebleed currently.  Given her history, CTA was obtained.   IMPRESSION: 1. New occlusion of the proximal right MCA M2 segment inferior division. 2. Chronic high-grade stenosis of the distal left MCA M1 segment. 3. Old left parietal infarct and findings of chronic ischemic microangiopathy.  Patient continues to deny headache.  Discussed with Dr. Rory Percy of neurology.  He recommends MRI given CTA findings and worsening aphasia.  If this is negative patient can likely be discharged home for outpatient neurology follow-up.  Denies any headache currently.  No temporal artery tenderness.  ESR is pending.  No visual changes.  Patient to be transferred for MRI at [facility].  If this is negative for stroke  patient can likely be discharged with outpatient neurology follow-up.  Discussed with Dr. Langston Masker       Final Clinical Impression(s) / ED Diagnoses Final diagnoses:  Bad headache  Expressive aphasia    Rx / DC Orders ED Discharge Orders     None         Ezequiel Essex, MD 05/27/22 2023

## 2022-05-27 NOTE — ED Notes (Signed)
Patient transported to CT 

## 2022-05-27 NOTE — ED Triage Notes (Addendum)
Patient here POV from Home.  Endorses Headaches and Nose Bleeding Intermittently for 2 Days. History of CVA. Remaining Deficits are Expressive Aphasia but Family endorses Aphasia has worsened somewhat recently.   History of HTN; treated with Norvasc.   NAD noted during Triage. A&Ox4. GCS 15. Ambulatory.

## 2022-05-27 NOTE — ED Notes (Signed)
Pt. To CT prior to this nurse seeing her

## 2022-05-28 ENCOUNTER — Telehealth: Payer: Self-pay | Admitting: *Deleted

## 2022-05-28 LAB — TROPONIN I (HIGH SENSITIVITY): Troponin I (High Sensitivity): 5 ng/L (ref <18)

## 2022-05-28 NOTE — Discharge Instructions (Signed)
MRI today did not show any findings of new stroke. Please follow-up with your neurologist. Return here for new concerns.

## 2022-05-28 NOTE — ED Notes (Signed)
Patient verbalizes understanding of d/c instructions. Opportunities for questions and answers were provided. Pt d/c from ED and wheeled to lobby.

## 2022-05-28 NOTE — Telephone Encounter (Signed)
Transition Care Management Follow-up Telephone Call Date of discharge and from where: 05/28/2022 [facility] ED How have you been since you were released from the hospital? Patient states she is doing ok  Any questions or concerns? No  Items Reviewed: Did the pt receive and understand the discharge instructions provided? Yes  Medications obtained and verified? Yes  Other? No  Any new allergies since your discharge? No  Dietary orders reviewed? No Do you have support at home? Yes   Home Care and Equipment/Supplies: Were home health services ordered? not applicable If so, what is the name of the agency? no  Has the agency set up a time to come to the patient's home? not applicable Were any new equipment or medical supplies ordered?  No What is the name of the medical supply agency? no Were you able to get the supplies/equipment? not applicable Do you have any questions related to the use of the equipment or supplies? No  Functional Questionnaire: (I = Independent and D = Dependent) ADLs: I  Bathing/Dressing- I  Meal Prep- I  Eating- I  Maintaining continence- I  Transferring/Ambulation- I  Managing Meds- I  Follow up appointments reviewed:  PCP Hospital f/u appt confirmed? Yes  Scheduled to see Dr. Mitchel Honour on 06/03/2022 @ 2:40pm. [facility] f/u appt confirmed? No  Scheduled to see n/a on n/a @ n/a. Are transportation arrangements needed? No  If their condition worsens, is the pt aware to call PCP or go to the Emergency Dept.? Yes Was the patient provided with contact information for the PCP's office or ED? Yes Was to pt encouraged to call back with questions or concerns? Yes

## 2022-05-28 NOTE — ED Notes (Signed)
The remainder '1mg'$  of ativan was wasted with Kandee Keen, RN

## 2022-05-31 ENCOUNTER — Telehealth: Payer: Self-pay | Admitting: *Deleted

## 2022-05-31 NOTE — Telephone Encounter (Signed)
Patient has an appointment on 12/21 for an ED visit

## 2022-06-03 ENCOUNTER — Ambulatory Visit: Payer: PRIVATE HEALTH INSURANCE | Admitting: Emergency Medicine

## 2022-06-30 ENCOUNTER — Telehealth: Payer: Self-pay | Admitting: Emergency Medicine

## 2022-06-30 NOTE — Telephone Encounter (Signed)
Called to schedule AWV, patient  answered stated she did not know who Dr. Franky Macho was. AWV not scheduled.

## 2022-08-12 ENCOUNTER — Telehealth: Payer: Self-pay

## 2022-08-12 NOTE — Telephone Encounter (Signed)
Called patient to schedule Medicare Annual Wellness Visit (AWV). No voicemail available to leave a message.  Last date of AWV: No HX of AWV, eligible 03/14/22  Please schedule an appointment at any time with N[facility] or [facility] 2.  Norton Blizzard, [place] ([facility])  [facility] [phone]

## 2022-10-29 ENCOUNTER — Ambulatory Visit (INDEPENDENT_AMBULATORY_CARE_PROVIDER_SITE_OTHER): Payer: Medicare Other | Admitting: Adult Health

## 2022-10-29 ENCOUNTER — Encounter: Payer: Self-pay | Admitting: Adult Health

## 2022-10-29 VITALS — BP 132/88 | HR 79 | Temp 98.1°F | Resp 18 | Ht 65.0 in | Wt 154.4 lb

## 2022-10-29 DIAGNOSIS — M1712 Unilateral primary osteoarthritis, left knee: Secondary | ICD-10-CM

## 2022-10-29 DIAGNOSIS — I10 Essential (primary) hypertension: Secondary | ICD-10-CM | POA: Diagnosis not present

## 2022-10-29 DIAGNOSIS — M1611 Unilateral primary osteoarthritis, right hip: Secondary | ICD-10-CM

## 2022-10-29 DIAGNOSIS — E1122 Type 2 diabetes mellitus with diabetic chronic kidney disease: Secondary | ICD-10-CM

## 2022-10-29 DIAGNOSIS — Z1211 Encounter for screening for malignant neoplasm of colon: Secondary | ICD-10-CM

## 2022-10-29 DIAGNOSIS — Z7689 Persons encountering health services in other specified circumstances: Secondary | ICD-10-CM

## 2022-10-29 DIAGNOSIS — R413 Other amnesia: Secondary | ICD-10-CM

## 2022-10-29 DIAGNOSIS — Z113 Encounter for screening for infections with a predominantly sexual mode of transmission: Secondary | ICD-10-CM

## 2022-10-29 DIAGNOSIS — G3184 Mild cognitive impairment, so stated: Secondary | ICD-10-CM

## 2022-10-29 DIAGNOSIS — Z1231 Encounter for screening mammogram for malignant neoplasm of breast: Secondary | ICD-10-CM

## 2022-10-29 DIAGNOSIS — N1832 Type 2 diabetes mellitus with diabetic chronic kidney disease: Secondary | ICD-10-CM

## 2022-10-29 DIAGNOSIS — Z8673 Personal history of transient ischemic attack (TIA), and cerebral infarction without residual deficits: Secondary | ICD-10-CM

## 2022-10-29 DIAGNOSIS — Z13820 Encounter for screening for osteoporosis: Secondary | ICD-10-CM

## 2022-10-29 MED ORDER — ROSUVASTATIN CALCIUM 10 MG PO TABS: 10.0000 mg | ORAL_TABLET | Freq: Every day | ORAL | 3 refills | Status: DC

## 2022-10-29 MED ORDER — CLOPIDOGREL BISULFATE 75 MG PO TABS: 75.0000 mg | ORAL_TABLET | Freq: Every day | ORAL | 3 refills | Status: DC

## 2022-10-29 MED ORDER — DONEPEZIL HCL 5 MG PO TABS: 5.0000 mg | ORAL_TABLET | Freq: Every day | ORAL | 3 refills | Status: DC

## 2022-10-29 NOTE — Progress Notes (Signed)
[facility] clinic  Provider:  Kenard Gower DNP  Code Status:  Full Code  Goals of Care:     10/29/2022    2:07 PM  Advanced Directives  Does Patient Have a Medical Advance Directive? Yes  Does patient want to make changes to medical advance directive? No - Patient declined     Chief Complaint  Patient presents with   Establish Care    New patient to establish care.    HPI: Patient is a 72 y.o. female seen today fto establish care with [facility]. She was accompanied today by her 2 sons. She is married, living with husband and son. She has an associate degree and used to work in Designer, fashion/clothing and a Lawyer. She has 4 sons, ages 73, 39, 70 and 39. She had stroke 3X, 2023, 2021 and 2020. She gets out of the house everyday with husband and son. Son noticed that she has trouble remembering and sometimes "makes things up".  She sometimes has accusatory talks which accelerates to agitation. She would sometimes wake up at 12-1 am and  sometimes call her son at 2-3am and just talk. She was noticed to be word finding when talking today. MMSE was administered today and obtained a score of 21/30.   She complains of right hip and left knee pain.  Past Medical History:  Diagnosis Date   Acute right ankle pain 05/25/2018   Diabetes mellitus    Hypertension    Pain in gums 02/25/2020   Peroneal tendinitis, right leg 05/30/2018   Pigmented skin lesion of uncertain nature 07/16/2019   Ms. Zirpoli reports a 3 mo hx of a skin lesion, centrally located below her breasts. She is concerned because her sister developed breast cancer after noticing a spot on her breast. Patient reports no bleeding or itching. She states it is not getting progressively larger. She states she accidentally scraped it one day which was painful. She has never had anything like this before. She denies a fami   Retinopathy    Sarcoidosis    Screening mammogram, encounter for 06/29/2012   Stroke [facility])     Past Surgical  History:  Procedure Laterality Date   EP IMPLANTABLE DEVICE N/A 09/29/2015   Procedure: Loop Recorder Insertion;  Surgeon: Marinus Maw, MD;  Location: [facility] INVASIVE CV LAB;  Service: Cardiovascular;  Laterality: N/A;   INTRAOCULAR LENS INSERTION     KNEE SURGERY  1999   PET/CT MONITORING/KAPOSIS/SARCOM ([facility] HX)     TEE WITHOUT CARDIOVERSION N/A 09/29/2015   Procedure: TRANSESOPHAGEAL ECHOCARDIOGRAM (TEE);  Surgeon: Laurey Morale, MD;  Location: [facility] ENDOSCOPY;  Service: Cardiovascular;  Laterality: N/A;    Allergies  Allergen Reactions   Lisinopril Cough   Metformin And Related Nausea Only    "severe itching"    Outpatient Encounter Medications as of 10/29/2022  Medication Sig   amLODipine (NORVASC) 10 MG tablet Take 10 mg by mouth daily.   clopidogrel (PLAVIX) 75 MG tablet Take 1 tablet (75 mg total) by mouth daily.   donepezil (ARICEPT) 5 MG tablet Take 1 tablet (5 mg total) by mouth at bedtime.   rosuvastatin (CRESTOR) 10 MG tablet Take 1 tablet (10 mg total) by mouth daily.   [DISCONTINUED] Accu-Chek Softclix Lancets lancets Check blood sugar up to 1 time a day as instructed   [DISCONTINUED] Blood Glucose Monitoring Suppl (ACCU-CHEK GUIDE) w/Device KIT 1 each by Does not apply route 2 (two) times daily.   [DISCONTINUED] cholecalciferol (VITAMIN D3) 25 MCG (  1000 UT) tablet Take 1,000 Units by mouth daily.   [DISCONTINUED] CINNAMON PO Take 1 capsule by mouth daily. (Patient not taking: Reported on 10/01/2021)   [DISCONTINUED] clopidogrel (PLAVIX) 75 MG tablet Take 75 mg by mouth daily.   [DISCONTINUED] clopidogrel (PLAVIX) 75 MG tablet Take 1 tablet by mouth daily.   [DISCONTINUED] ELDERBERRY PO Take 1 capsule by mouth daily. (Patient not taking: Reported on 10/01/2021)   [DISCONTINUED] glipiZIDE (GLUCOTROL) 5 MG tablet Take 5 mg by mouth daily.   [DISCONTINUED] glucose blood (ACCU-CHEK GUIDE) test strip Check blood sugar up to 1 time per day as instructed   [DISCONTINUED] Lancet  Device MISC For True Track meter - use to check blood sugars once a week. Dx code: 250.00.   [DISCONTINUED] lisinopril (ZESTRIL) 10 MG tablet Take 1 tablet (10 mg total) by mouth daily.   [DISCONTINUED] Nutritional Supplements (NUTRITIONAL SUPPLEMENT PO) Take 1 tablet by mouth daily. High Blood Sugar. (Patient not taking: Reported on 10/01/2021)   [DISCONTINUED] OVER THE COUNTER MEDICATION Take 0.5 drops by mouth daily. Silver supplement (Patient not taking: Reported on 10/01/2021)   [DISCONTINUED] OVER THE COUNTER MEDICATION Take 1 capsule by mouth daily. Coconut supplement (Patient not taking: Reported on 10/01/2021)   [DISCONTINUED] rosuvastatin (CRESTOR) 10 MG tablet Take 1 tablet (10 mg total) by mouth daily.   No facility-administered encounter medications on file as of 10/29/2022.    Review of Systems:  Review of Systems  Constitutional:  Negative for appetite change, chills, fatigue and fever.  HENT:  Negative for congestion, hearing loss, rhinorrhea and sore throat.   Eyes: Negative.   Respiratory:  Negative for cough, shortness of breath and wheezing.   Cardiovascular:  Negative for chest pain, palpitations and leg swelling.  Gastrointestinal:  Negative for abdominal pain, constipation, diarrhea, nausea and vomiting.  Genitourinary:  Negative for dysuria.  Musculoskeletal:  Negative for arthralgias, back pain and myalgias.       Right hip and left knee pain  Skin:  Negative for color change, rash and wound.  Neurological:  Negative for dizziness, weakness and headaches.  Psychiatric/Behavioral:  Negative for behavioral problems. The patient is not nervous/anxious.     Health Maintenance  Topic Date Due   COVID-19 Vaccine (1) Never done   Pneumonia Vaccine 49+ Years old (1 of 2 - PCV) Never done   DTaP/Tdap/Td (1 - Tdap) Never done   Zoster Vaccines- Shingrix (1 of 2) Never done   COLONOSCOPY (Pts 45-28yrs Insurance coverage will need to be confirmed)  Never done   COLON CANCER  SCREENING ANNUAL FOBT  11/20/2009   MAMMOGRAM  01/07/2011   DEXA SCAN  Never done   OPHTHALMOLOGY EXAM  10/20/2016   Diabetic kidney evaluation - Urine ACR  02/10/2021   HEMOGLOBIN A1C  12/31/2021   Medicare Annual Wellness (AWV)  03/23/2022   LIPID PANEL  10/02/2022   INFLUENZA VACCINE  01/13/2023   Diabetic kidney evaluation - eGFR measurement  05/28/2023   FOOT EXAM  10/29/2023   Hepatitis C Screening  Completed   HPV VACCINES  Aged Out    Physical Exam: Vitals:   10/29/22 1409  BP: 132/88  Pulse: 79  Resp: 18  Temp: 98.1 F (36.7 C)  SpO2: 99%  Weight: 154 lb 7 oz (70.1 kg)  Height: 5\' 5"  (1.651 m)   Body mass index is 25.7 kg/m. Physical Exam Constitutional:      Appearance: Normal appearance.  HENT:     Head: Normocephalic and atraumatic.  Nose: Nose normal.     Mouth/Throat:     Mouth: Mucous membranes are moist.  Eyes:     Conjunctiva/sclera: Conjunctivae normal.  Cardiovascular:     Rate and Rhythm: Normal rate and regular rhythm.  Pulmonary:     Effort: Pulmonary effort is normal.     Breath sounds: Normal breath sounds.  Abdominal:     General: Bowel sounds are normal.     Palpations: Abdomen is soft.  Musculoskeletal:        General: Normal range of motion.     Cervical back: Normal range of motion.     Comments: Left knee 2+edema  Skin:    General: Skin is warm and dry.  Neurological:     Mental Status: She is alert. Mental status is at baseline.     Comments: Apraxic, alert to self only  Psychiatric:        Mood and Affect: Mood normal.        Behavior: Behavior normal.        Thought Content: Thought content normal.        Judgment: Judgment normal.     Labs reviewed: Basic Metabolic Panel: Recent Labs    05/27/22 1756  NA 139  K 3.4*  CL 101  CO2 28  GLUCOSE 124*  BUN 21  CREATININE 1.37*  CALCIUM 9.6   Liver Function Tests: No results for input(s): "AST", "ALT", "ALKPHOS", "BILITOT", "PROT", "ALBUMIN" in the last 8760  hours. No results for input(s): "LIPASE", "AMYLASE" in the last 8760 hours. No results for input(s): "AMMONIA" in the last 8760 hours. CBC: Recent Labs    05/27/22 1756  WBC 5.9  NEUTROABS 4.0  HGB 11.7*  HCT 35.5*  MCV 84.1  PLT 272   Lipid Panel: No results for input(s): "CHOL", "HDL", "LDLCALC", "TRIG", "CHOLHDL", "LDLDIRECT" in the last 8760 hours. Lab Results  Component Value Date   HGBA1C 5.8 10/01/2021    Procedures since last visit: No results found.  Assessment/Plan  1. Encounter to establish care -  established care with [facility]  2. Essential hypertension, benign -  BP 132/88 - amLODipine (NORVASC) 10 MG tablet; Take 10 mg by mouth daily.  3. Type 2 diabetes mellitus with stage 3b chronic kidney disease, without long-term current use of insulin (HCC) -  A1C 5.8, 10/01/21 -  diet-controlled - CBC With Differential/Platelet - Complete Metabolic Panel with eGFR - Hemoglobin A1C  4. Primary localized osteoarthritis of left knee -  left knee painful, denies trauma - DG Knee Complete 4 Views Left  5. Hx of stroke without residual deficits - Lipid panel - Ambulatory referral to Neurology - rosuvastatin (CRESTOR) 10 MG tablet; Take 1 tablet (10 mg total) by mouth daily.  Dispense: 90 tablet; Refill: 3  6. Screen for STD (sexually transmitted disease) - Hep C Antibody - HIV antibody (with reflex) - clopidogrel (PLAVIX) 75 MG tablet; Take 1 tablet (75 mg total) by mouth daily.  Dispense: 90 tablet; Refill: 3  7. Encounter for screening mammogram for malignant neoplasm of breast - MM 3D SCREENING MAMMOGRAM BILATERAL BREAST  8. Screening for osteoporosis - DG BONE DENSITY (DXA)  9. Mild cognitive impairment -  obtained MMSE score 21/30, ranging in minimal cognitive deficit -  will start on Aricept - donepezil (ARICEPT) 5 MG tablet; Take 1 tablet (5 mg total) by mouth at bedtime.  Dispense: 30 tablet; Refill: 3  10. Unilateral primary osteoarthritis, right  hip - DG Hip Unilat W OR  W/O Pelvis 2-3 Views Right  11. Colon cancer screening - Ambulatory referral to Gastroenterology    Labs/tests ordered:  Hep C antibody, HIV, CBC, CMP, lipid panel and A1C  -DG Hip Unilat W OR W/O Pelvis 2-3 Views Right - MM 3D SCREENING MAMMOGRAM BILATERAL BREAST - DG BONE DENSITY (DXA) - DG Knee Complete 4 Views Left   Next appt:  Visit date not found

## 2022-10-29 NOTE — Patient Instructions (Signed)
Preventive Care 65 Years and Older, Female Preventive care refers to lifestyle choices and visits with your health care provider that can promote health and wellness. Preventive care visits are also called wellness exams. What can I expect for my preventive care visit? Counseling Your health care provider may ask you questions about your: Medical history, including: Past medical problems. Family medical history. Pregnancy and menstrual history. History of falls. Current health, including: Memory and ability to understand (cognition). Emotional well-being. Home life and relationship well-being. Sexual activity and sexual health. Lifestyle, including: Alcohol, nicotine or tobacco, and drug use. Access to firearms. Diet, exercise, and sleep habits. Work and work environment. Sunscreen use. Safety issues such as seatbelt and bike helmet use. Physical exam Your health care provider will check your: Height and weight. These may be used to calculate your BMI (body mass index). BMI is a measurement that tells if you are at a healthy weight. Waist circumference. This measures the distance around your waistline. This measurement also tells if you are at a healthy weight and may help predict your risk of certain diseases, such as type 2 diabetes and high blood pressure. Heart rate and blood pressure. Body temperature. Skin for abnormal spots. What immunizations do I need?  Vaccines are usually given at various ages, according to a schedule. Your health care provider will recommend vaccines for you based on your age, medical history, and lifestyle or other factors, such as travel or where you work. What tests do I need? Screening Your health care provider may recommend screening tests for certain conditions. This may include: Lipid and cholesterol levels. Hepatitis C test. Hepatitis B test. HIV (human immunodeficiency virus) test. STI (sexually transmitted infection) testing, if you are at  risk. Lung cancer screening. Colorectal cancer screening. Diabetes screening. This is done by checking your blood sugar (glucose) after you have not eaten for a while (fasting). Mammogram. Talk with your health care provider about how often you should have regular mammograms. BRCA-related cancer screening. This may be done if you have a family history of breast, ovarian, tubal, or peritoneal cancers. Bone density scan. This is done to screen for osteoporosis. Talk with your health care provider about your test results, treatment options, and if necessary, the need for more tests. Follow these instructions at home: Eating and drinking  Eat a diet that includes fresh fruits and vegetables, whole grains, lean protein, and low-fat dairy products. Limit your intake of foods with high amounts of sugar, saturated fats, and salt. Take vitamin and mineral supplements as recommended by your health care provider. Do not drink alcohol if your health care provider tells you not to drink. If you drink alcohol: Limit how much you have to 0-1 drink a day. Know how much alcohol is in your drink. In the [place] one drink equals one 12 oz bottle of beer (355 mL), one 5 oz glass of wine (148 mL), or one 1 oz glass of hard liquor (44 mL). Lifestyle Brush your teeth every morning and night with fluoride toothpaste. Floss one time each day. Exercise for at least 30 minutes 5 or more days each week. Do not use any products that contain nicotine or tobacco. These products include cigarettes, chewing tobacco, and vaping devices, such as e-cigarettes. If you need help quitting, ask your health care provider. Do not use drugs. If you are sexually active, practice safe sex. Use a condom or other form of protection in order to prevent STIs. Take aspirin only as told by   your health care provider. Make sure that you understand how much to take and what form to take. Work with your health care provider to find out whether it  is safe and beneficial for you to take aspirin daily. Ask your health care provider if you need to take a cholesterol-lowering medicine (statin). Find healthy ways to manage stress, such as: Meditation, yoga, or listening to music. Journaling. Talking to a trusted person. Spending time with friends and family. Minimize exposure to UV radiation to reduce your risk of skin cancer. Safety Always wear your seat belt while driving or riding in a vehicle. Do not drive: If you have been drinking alcohol. Do not ride with someone who has been drinking. When you are tired or distracted. While texting. If you have been using any mind-altering substances or drugs. Wear a helmet and other protective equipment during sports activities. If you have firearms in your house, make sure you follow all gun safety procedures. What's next? Visit your health care provider once a year for an annual wellness visit. Ask your health care provider how often you should have your eyes and teeth checked. Stay up to date on all vaccines. This information is not intended to replace advice given to you by your health care provider. Make sure you discuss any questions you have with your health care provider. Document Revised: 11/26/2020 Document Reviewed: 11/26/2020 Elsevier Patient Education  2023 Elsevier Inc.   

## 2022-10-30 LAB — HEPATITIS C ANTIBODY: Hepatitis C Ab: NONREACTIVE

## 2022-10-30 LAB — COMPLETE METABOLIC PANEL WITH GFR
AG Ratio: 1.1 (calc) (ref 1.0–2.5)
ALT: 9 U/L (ref 6–29)
AST: 18 U/L (ref 10–35)
Albumin: 4 g/dL (ref 3.6–5.1)
Alkaline phosphatase (APISO): 102 U/L (ref 37–153)
BUN/Creatinine Ratio: 16 (calc) (ref 6–22)
BUN: 22 mg/dL (ref 7–25)
CO2: 29 mmol/L (ref 20–32)
Calcium: 9.5 mg/dL (ref 8.6–10.4)
Chloride: 102 mmol/L (ref 98–110)
Creat: 1.39 mg/dL — ABNORMAL HIGH (ref 0.60–1.00)
Globulin: 3.6 g/dL (calc) (ref 1.9–3.7)
Glucose, Bld: 112 mg/dL — ABNORMAL HIGH (ref 65–99)
Potassium: 4.1 mmol/L (ref 3.5–5.3)
Sodium: 139 mmol/L (ref 135–146)
Total Bilirubin: 0.5 mg/dL (ref 0.2–1.2)
Total Protein: 7.6 g/dL (ref 6.1–8.1)
eGFR: 40 mL/min/{1.73_m2} — ABNORMAL LOW (ref >=60)

## 2022-10-30 LAB — CBC WITH DIFFERENTIAL/PLATELET
Absolute Monocytes: 333 cells/uL (ref 200–950)
Basophils Absolute: 52 cells/uL (ref 0–200)
Basophils Relative: 1 %
Eosinophils Absolute: 130 cells/uL (ref 15–500)
Eosinophils Relative: 2.5 %
HCT: 38.3 % (ref 35.0–45.0)
Hemoglobin: 12.5 g/dL (ref 11.7–15.5)
Lymphs Abs: 1113 cells/uL (ref 850–3900)
MCH: 27.3 pg (ref 27.0–33.0)
MCHC: 32.6 g/dL (ref 32.0–36.0)
MCV: 83.6 fL (ref 80.0–100.0)
MPV: 11 fL (ref 7.5–12.5)
Monocytes Relative: 6.4 %
Neutro Abs: 3572 cells/uL (ref 1500–7800)
Neutrophils Relative %: 68.7 %
Platelets: 287 10*3/uL (ref 140–400)
RBC: 4.58 10*6/uL (ref 3.80–5.10)
RDW: 14.2 % (ref 11.0–15.0)
Total Lymphocyte: 21.4 %
WBC: 5.2 10*3/uL (ref 3.8–10.8)

## 2022-10-30 LAB — LIPID PANEL
Cholesterol: 267 mg/dL — ABNORMAL HIGH (ref <200)
HDL: 83 mg/dL (ref >=50)
LDL Cholesterol (Calc): 166 mg/dL (calc) — ABNORMAL HIGH
Non-HDL Cholesterol (Calc): 184 mg/dL (calc) — ABNORMAL HIGH (ref <130)
Total CHOL/HDL Ratio: 3.2 (calc) (ref <5.0)
Triglycerides: 79 mg/dL (ref <150)

## 2022-10-30 LAB — HEMOGLOBIN A1C
Hgb A1c MFr Bld: 6.8 % of total Hgb — ABNORMAL HIGH (ref <5.7)
Mean Plasma Glucose: 148 mg/dL
eAG (mmol/L): 8.2 mmol/L

## 2022-10-30 LAB — HIV ANTIBODY (ROUTINE TESTING W REFLEX): HIV 1&2 Ab, 4th Generation: NONREACTIVE

## 2022-11-02 ENCOUNTER — Telehealth: Payer: Self-pay

## 2022-11-02 NOTE — Telephone Encounter (Signed)
Patient's son Wendy [name] called stating that patient has headaches and would like to know what she can take given the medications that she takes.   I spoke with Kenard Gower, NP via telephone and she informed me that patient may take Motrin 200mg  (2 tablets=400mg ) avery 8 hours prn or Tylenol 1,000 mg every 8 hours prn.  I returned call to son and informed him and he verbalized his understanding.

## 2022-11-03 NOTE — Progress Notes (Signed)
-    A1C 6.8, up from 5.8 (taken 09/2021), will need to re-start taking Jardiance 10 mg daily, does she still have medication supply? -  cholesterol 267, elevated (normal <200)  -  LDL 166, elevated (normal <161), continue taking Crestor 10 mg daily, is she taking this consistently? -  GFR 40, ranging in chronic kidney disease stage 3b,  -  electrolytes, liver enzymes, CBC all normal -  hep C antibody and HIV negative

## 2022-11-12 ENCOUNTER — Ambulatory Visit
Admission: RE | Admit: 2022-11-12 | Discharge: 2022-11-12 | Disposition: A | Discharge status: Home / Self Care | Payer: Medicare Other | Source: Ambulatory Visit | Attending: Adult Health | Admitting: Adult Health

## 2022-11-12 ENCOUNTER — Ambulatory Visit (INDEPENDENT_AMBULATORY_CARE_PROVIDER_SITE_OTHER): Payer: Medicare Other | Admitting: Adult Health

## 2022-11-12 ENCOUNTER — Encounter: Payer: Self-pay | Admitting: Adult Health

## 2022-11-12 VITALS — BP 133/78 | HR 88 | Temp 98.4°F | Resp 18 | Ht 65.0 in | Wt 158.6 lb

## 2022-11-12 DIAGNOSIS — M150 Primary generalized (osteo)arthritis: Secondary | ICD-10-CM

## 2022-11-12 DIAGNOSIS — Z8673 Personal history of transient ischemic attack (TIA), and cerebral infarction without residual deficits: Secondary | ICD-10-CM

## 2022-11-12 DIAGNOSIS — G3184 Mild cognitive impairment, so stated: Secondary | ICD-10-CM | POA: Diagnosis not present

## 2022-11-12 DIAGNOSIS — I10 Essential (primary) hypertension: Secondary | ICD-10-CM

## 2022-11-12 DIAGNOSIS — F419 Anxiety disorder, unspecified: Secondary | ICD-10-CM

## 2022-11-12 DIAGNOSIS — E1122 Type 2 diabetes mellitus with diabetic chronic kidney disease: Secondary | ICD-10-CM

## 2022-11-12 DIAGNOSIS — N1832 Chronic kidney disease, stage 3b: Secondary | ICD-10-CM

## 2022-11-12 DIAGNOSIS — M159 Polyosteoarthritis, unspecified: Secondary | ICD-10-CM

## 2022-11-12 MED ORDER — LORAZEPAM 0.5 MG PO TABS: 0.5000 mg | ORAL_TABLET | Freq: Every evening | ORAL | 0 refills | Status: DC | PRN

## 2022-11-12 NOTE — Progress Notes (Unsigned)
[facility] clinic  Provider:   Code Status: *** Goals of Care:     11/12/2022    2:11 PM  Advanced Directives  Does Patient Have a Medical Advance Directive? Yes  Does patient want to make changes to medical advance directive? No - Patient declined     Chief Complaint  Patient presents with   Follow-up    Two weeks Follow-up   Quality Metric Gaps    HPI: Patient is a 72 y.o. female seen today for an acute visit for  Past Medical History:  Diagnosis Date   Acute right ankle pain 05/25/2018   Diabetes mellitus    Hypertension    Pain in gums 02/25/2020   Peroneal tendinitis, right leg 05/30/2018   Pigmented skin lesion of uncertain nature 07/16/2019   Wendy [name] reports a 3 mo hx of a skin lesion, centrally located below her breasts. She is concerned because her sister developed breast cancer after noticing a spot on her breast. Patient reports no bleeding or itching. She states it is not getting progressively larger. She states she accidentally scraped it one day which was painful. She has never had anything like this before. She denies a fami   Retinopathy    Sarcoidosis    Screening mammogram, encounter for 06/29/2012   Stroke [facility])     Past Surgical History:  Procedure Laterality Date   EP IMPLANTABLE DEVICE N/A 09/29/2015   Procedure: Loop Recorder Insertion;  Surgeon: Marinus Maw, MD;  Location: [facility] INVASIVE CV LAB;  Service: Cardiovascular;  Laterality: N/A;   INTRAOCULAR LENS INSERTION     KNEE SURGERY  1999   PET/CT MONITORING/KAPOSIS/SARCOM ([facility] HX)     TEE WITHOUT CARDIOVERSION N/A 09/29/2015   Procedure: TRANSESOPHAGEAL ECHOCARDIOGRAM (TEE);  Surgeon: Laurey Morale, MD;  Location: [facility] ENDOSCOPY;  Service: Cardiovascular;  Laterality: N/A;    Allergies  Allergen Reactions   Lisinopril Cough   Metformin And Related Nausea Only    "severe itching"    Outpatient Encounter Medications as of 11/12/2022  Medication Sig   amLODipine (NORVASC) 10 MG tablet  Take 10 mg by mouth daily.   clopidogrel (PLAVIX) 75 MG tablet Take 1 tablet (75 mg total) by mouth daily.   donepezil (ARICEPT) 5 MG tablet Take 1 tablet (5 mg total) by mouth at bedtime.   [DISCONTINUED] rosuvastatin (CRESTOR) 10 MG tablet Take 1 tablet (10 mg total) by mouth daily.   No facility-administered encounter medications on file as of 11/12/2022.    Review of Systems:  Review of Systems  Constitutional:  Negative for appetite change, chills, fatigue and fever.  HENT:  Negative for congestion, hearing loss, rhinorrhea and sore throat.   Eyes: Negative.   Respiratory:  Negative for cough, shortness of breath and wheezing.   Cardiovascular:  Negative for chest pain, palpitations and leg swelling.  Gastrointestinal:  Negative for abdominal pain, constipation, diarrhea, nausea and vomiting.  Genitourinary:  Negative for dysuria.  Musculoskeletal:  Negative for arthralgias, back pain and myalgias.  Skin:  Negative for color change, rash and wound.  Neurological:  Negative for dizziness, weakness and headaches.  Psychiatric/Behavioral:  Negative for behavioral problems. The patient is not nervous/anxious.     Health Maintenance  Topic Date Due   Colonoscopy  Never done   COLON CANCER SCREENING ANNUAL FOBT  11/20/2009   MAMMOGRAM  01/07/2011   DEXA SCAN  Never done   OPHTHALMOLOGY EXAM  10/20/2016   Diabetic kidney evaluation - Urine ACR  02/10/2021   Medicare Annual Wellness (AWV)  03/23/2022   COVID-19 Vaccine (1) 11/28/2022 (Originally 09/16/1955)   Zoster Vaccines- Shingrix (1 of 2) 02/12/2023 (Originally 09/15/1969)   Pneumonia Vaccine 33+ Years old (1 of 2 - PCV) 11/12/2023 (Originally 09/15/1956)   INFLUENZA VACCINE  01/13/2023   HEMOGLOBIN A1C  01/29/2023   Diabetic kidney evaluation - eGFR measurement  10/29/2023   FOOT EXAM  10/29/2023   LIPID PANEL  10/29/2023   Hepatitis C Screening  Completed   HPV VACCINES  Aged Out   DTaP/Tdap/Td  Discontinued    Physical  Exam: Vitals:   11/12/22 1413  BP: 133/78  Pulse: 88  Resp: 18  Temp: 98.4 F (36.9 C)  SpO2: 99%  Weight: 158 lb 9.6 oz (71.9 kg)  Height: 5\' 5"  (1.651 m)   Body mass index is 26.39 kg/m. Physical Exam Constitutional:      Appearance: Normal appearance.  HENT:     Head: Normocephalic and atraumatic.     Nose: Nose normal.     Mouth/Throat:     Mouth: Mucous membranes are moist.  Eyes:     Conjunctiva/sclera: Conjunctivae normal.  Cardiovascular:     Rate and Rhythm: Normal rate and regular rhythm.  Pulmonary:     Effort: Pulmonary effort is normal.     Breath sounds: Normal breath sounds.  Abdominal:     General: Bowel sounds are normal.     Palpations: Abdomen is soft.  Musculoskeletal:        General: Normal range of motion.     Cervical back: Normal range of motion.  Skin:    General: Skin is warm and dry.  Neurological:     Mental Status: She is alert. Mental status is at baseline.  Psychiatric:        Mood and Affect: Mood normal.        Behavior: Behavior normal.        Thought Content: Thought content normal.        Judgment: Judgment normal.    Labs reviewed: Basic Metabolic Panel: Recent Labs    05/27/22 1756 10/29/22 1520  NA 139 139  K 3.4* 4.1  CL 101 102  CO2 28 29  GLUCOSE 124* 112*  BUN 21 22  CREATININE 1.37* 1.39*  CALCIUM 9.6 9.5   Liver Function Tests: Recent Labs    10/29/22 1520  AST 18  ALT 9  BILITOT 0.5  PROT 7.6   No results for input(s): "LIPASE", "AMYLASE" in the last 8760 hours. No results for input(s): "AMMONIA" in the last 8760 hours. CBC: Recent Labs    05/27/22 1756 10/29/22 1520  WBC 5.9 5.2  NEUTROABS 4.0 3,572  HGB 11.7* 12.5  HCT 35.5* 38.3  MCV 84.1 83.6  PLT 272 287   Lipid Panel: Recent Labs    10/29/22 1520  CHOL 267*  HDL 83  LDLCALC 166*  TRIG 79  CHOLHDL 3.2   Lab Results  Component Value Date   HGBA1C 6.8 (H) 10/29/2022    Procedures since last visit: No results  found.  Assessment/Plan    Labs/tests ordered:  lipid panel next visit  Next appt:  Visit date not found

## 2022-11-12 NOTE — Addendum Note (Signed)
Addended by: Kenard Gower C on: 11/12/2022 02:34 PM   Modules accepted: Orders

## 2022-11-12 NOTE — Patient Instructions (Signed)
Dyslipidemia Dyslipidemia is an imbalance of waxy, fat-like substances (lipids) in the blood. The body needs lipids in small amounts. Dyslipidemia often involves a high level of cholesterol or triglycerides, which are types of lipids. Common forms of dyslipidemia include: High levels of LDL cholesterol. LDL is the type of cholesterol that causes fatty deposits (plaques) to build up in the blood vessels that carry blood away from the heart (arteries). Low levels of HDL cholesterol. HDL cholesterol is the type of cholesterol that protects against heart disease. High levels of HDL remove the LDL buildup from arteries. High levels of triglycerides. Triglycerides are a fatty substance in the blood that is linked to a buildup of plaques in the arteries. What are the causes? There are two main types of dyslipidemia: primary and secondary. Primary dyslipidemia is caused by changes (mutations) in genes that are passed down through families (inherited). These mutations cause several types of dyslipidemia. Secondary dyslipidemia may be caused by various risk factors that can lead to the disease, such as lifestyle choices and certain medical conditions. What increases the risk? You are more likely to develop this condition if you are an older man or if you are a woman who has gone through menopause. Other risk factors include: Having a family history of dyslipidemia. Taking certain medicines, including birth control pills, steroids, some diuretics, and beta-blockers. Eating a diet high in saturated fat. Smoking cigarettes or excessive alcohol intake. Having certain medical conditions such as diabetes, polycystic ovary syndrome (PCOS), kidney disease, liver disease, or hypothyroidism. Not exercising regularly. Being overweight or obese with too much belly fat. What are the signs or symptoms? In most cases, dyslipidemia does not usually cause any symptoms. In severe cases, very high lipid levels can  cause: Fatty bumps under the skin (xanthomas). A white or gray ring around the black center (pupil) of the eye. Very high triglyceride levels can cause inflammation of the pancreas (pancreatitis). How is this diagnosed? Your health care provider may diagnose dyslipidemia based on a routine blood test (fasting blood test). Because most people do not have symptoms of the condition, this blood testing (lipid profile) is done on adults age 64 and older and is repeated every 4-6 years. This test checks: Total cholesterol. This measures the total amount of cholesterol in your blood, including LDL cholesterol, HDL cholesterol, and triglycerides. A healthy number is below 200 mg/dL (6.29 mmol/L). LDL cholesterol. The target number for LDL cholesterol is different for each person, depending on individual risk factors. A healthy number is usually below 100 mg/dL (5.28 mmol/L). Ask your health care provider what your LDL cholesterol should be. HDL cholesterol. An HDL level of 60 mg/dL (4.13 mmol/L) or higher is best because it helps to protect against heart disease. A number below 40 mg/dL (2.44 mmol/L) for men or below 50 mg/dL (0.10 mmol/L) for women increases the risk for heart disease. Triglycerides. A healthy triglyceride number is below 150 mg/dL (2.72 mmol/L). If your lipid profile is abnormal, your health care provider may do other blood tests. How is this treated? Treatment depends on the type of dyslipidemia that you have and your other risk factors for heart disease and stroke. Your health care provider will have a target range for your lipid levels based on this information. Treatment for dyslipidemia starts with lifestyle changes, such as diet and exercise. Your health care provider may recommend that you: Get regular exercise. Make changes to your diet. Quit smoking if you smoke. Limit your alcohol intake. If diet  changes and exercise do not help you reach your goals, your health care provider  may also prescribe medicine to lower lipids. The most commonly prescribed type of medicine lowers your LDL cholesterol (statin drug). If you have a high triglyceride level, your provider may prescribe another type of drug (fibrate) or an omega-3 fish oil supplement, or both. Follow these instructions at home: Eating and drinking  Follow instructions from your health care provider or dietitian about eating or drinking restrictions. Eat a healthy diet as told by your health care provider. This can help you reach and maintain a healthy weight, lower your LDL cholesterol, and raise your HDL cholesterol. This may include: Limiting your calories, if you are overweight. Eating more fruits, vegetables, whole grains, fish, and lean meats. Limiting saturated fat, trans fat, and cholesterol. Do not drink alcohol if: Your health care provider tells you not to drink. You are pregnant, may be pregnant, or are planning to become pregnant. If you drink alcohol: Limit how much you have to: 0-1 drink a day for women. 0-2 drinks a day for men. Know how much alcohol is in your drink. In the [place] one drink equals one 12 oz bottle of beer (355 mL), one 5 oz glass of wine (148 mL), or one 1 oz glass of hard liquor (44 mL). Activity Get regular exercise. Start an exercise and strength training program as told by your health care provider. Ask your health care provider what activities are safe for you. Your health care provider may recommend: 30 minutes of aerobic activity 4-6 days a week. Brisk walking is an example of aerobic activity. Strength training 2 days a week. General instructions Do not use any products that contain nicotine or tobacco. These products include cigarettes, chewing tobacco, and vaping devices, such as e-cigarettes. If you need help quitting, ask your health care provider. Take over-the-counter and prescription medicines only as told by your health care provider. This includes  supplements. Keep all follow-up visits. This is important. Contact a health care provider if: You are having trouble sticking to your exercise or diet plan. You are struggling to quit smoking or to control your use of alcohol. Summary Dyslipidemia often involves a high level of cholesterol or triglycerides, which are types of lipids. Treatment depends on the type of dyslipidemia that you have and your other risk factors for heart disease and stroke. Treatment for dyslipidemia starts with lifestyle changes, such as diet and exercise. Your health care provider may prescribe medicine to lower lipids. This information is not intended to replace advice given to you by your health care provider. Make sure you discuss any questions you have with your health care provider. Document Revised: 01/01/2022 Document Reviewed: 08/04/2020 Elsevier Patient Education  2024 [employer].

## 2022-11-17 NOTE — Progress Notes (Signed)
Left knee and right hip has severe osteoarthritis. May apply Biofreeze gel to left knee and right hip topically BID and will send referral to orthopedics

## 2022-11-30 ENCOUNTER — Ambulatory Visit (INDEPENDENT_AMBULATORY_CARE_PROVIDER_SITE_OTHER): Payer: Medicare Other | Admitting: Orthopaedic Surgery

## 2022-11-30 DIAGNOSIS — M1712 Unilateral primary osteoarthritis, left knee: Secondary | ICD-10-CM | POA: Diagnosis not present

## 2022-11-30 DIAGNOSIS — M1612 Unilateral primary osteoarthritis, left hip: Secondary | ICD-10-CM

## 2022-11-30 NOTE — Progress Notes (Signed)
Office Visit Note   Patient: Wendy [name]           Date of Birth: 20-Sep-1950           MRN: [id number] Visit Date: 11/30/2022              Requested by: Gillis Santa, NP [address] [place],  [place] [address] PCP: Gillis Santa, NP   Assessment & Plan: Visit Diagnoses:  1. Primary osteoarthritis of left knee   2. Primary osteoarthritis of left hip     Plan: Impression is 72 year old female with bone-on-bone right hip and left knee DJD.  X-rays reviewed with the patient and treatment options were discussed.  Her son and husband were both present.  Based on the severity of the DJD I recommended joint replacement surgery.  We will provide them with information so that they can make their decision.  We will see them back as needed.  Follow-Up Instructions: No follow-ups on file.   Orders:  No orders of the defined types were placed in this encounter.  No orders of the defined types were placed in this encounter.     Procedures: No procedures performed   Clinical Data: No additional findings.   Subjective: No chief complaint on file.   HPI Wendy [name] is 72 year old female here for evaluation of severe right hip and left knee pain.  She has had this for years.  She is status post CVA that left her with speech difficulties.  She has had 2 left knee injections without significant relief.  She has had continued constant pain.  This wakes her up at night and affects her ADLs.  Her son and husband are both present. Review of Systems  Constitutional: Negative.   HENT: Negative.    Eyes: Negative.   Respiratory: Negative.    Cardiovascular: Negative.   Endocrine: Negative.   Musculoskeletal: Negative.   Neurological: Negative.   Hematological: Negative.   Psychiatric/Behavioral: Negative.    All other systems reviewed and are negative.    Objective: Vital Signs: There were no vitals taken for this visit.  Physical Exam Vitals and nursing note  reviewed.  Constitutional:      Appearance: She is well-developed.  HENT:     Head: Atraumatic.     Nose: Nose normal.  Eyes:     Extraocular Movements: Extraocular movements intact.  Cardiovascular:     Pulses: Normal pulses.  Pulmonary:     Effort: Pulmonary effort is normal.  Abdominal:     Palpations: Abdomen is soft.  Musculoskeletal:     Cervical back: Neck supple.  Skin:    General: Skin is warm.     Capillary Refill: Capillary refill takes less than 2 seconds.  Neurological:     Mental Status: She is alert. Mental status is at baseline.  Psychiatric:        Behavior: Behavior normal.        Thought Content: Thought content normal.        Judgment: Judgment normal.     Ortho Exam Examination right hip shows pain with hip flexion and internal rotation.  Antalgic gait with a cane. Examination left knee shows large joint effusion.  2+ patellofemoral crepitus with range of motion.  Collaterals cruciates are stable.  Medial joint tenderness. Specialty Comments:  No specialty comments available.  Imaging: No results found.   PMFS History: Patient Active Problem List   Diagnosis Date Noted   Memory loss 10/01/2021  History of cardioembolic cerebrovascular accident (CVA) 10/20/2018   Unilateral primary osteoarthritis, right hip 05/30/2018   Osteoarthritis 03/10/2018   History of stroke 09/26/2015   CKD (chronic kidney disease) stage 3, GFR 30-59 ml/min (HCC) 10/14/2014   Background diabetic retinopathy(362.01) 07/25/2012   Sarcoidosis of lung (HCC) 09/30/2011   Hyperlipidemia 08/10/2011   Type 2 diabetes mellitus (HCC) 06/26/2008   Essential hypertension, benign 06/26/2008   Primary localized osteoarthritis of left knee 06/26/2008   Past Medical History:  Diagnosis Date   Acute right ankle pain 05/25/2018   Diabetes mellitus    Hypertension    Pain in gums 02/25/2020   Peroneal tendinitis, right leg 05/30/2018   Pigmented skin lesion of uncertain nature  07/16/2019   Wendy [name] reports a 3 mo hx of a skin lesion, centrally located below her breasts. She is concerned because her sister developed breast cancer after noticing a spot on her breast. Patient reports no bleeding or itching. She states it is not getting progressively larger. She states she accidentally scraped it one day which was painful. She has never had anything like this before. She denies a fami   Retinopathy    Sarcoidosis    Screening mammogram, encounter for 06/29/2012   Stroke [facility])     Family History  Problem Relation Age of Onset   Hypertension Son     Past Surgical History:  Procedure Laterality Date   EP IMPLANTABLE DEVICE N/A 09/29/2015   Procedure: Loop Recorder Insertion;  Surgeon: Marinus Maw, MD;  Location: [facility] INVASIVE CV LAB;  Service: Cardiovascular;  Laterality: N/A;   INTRAOCULAR LENS INSERTION     KNEE SURGERY  1999   PET/CT MONITORING/KAPOSIS/SARCOM ([facility] HX)     TEE WITHOUT CARDIOVERSION N/A 09/29/2015   Procedure: TRANSESOPHAGEAL ECHOCARDIOGRAM (TEE);  Surgeon: Laurey Morale, MD;  Location: [facility] ENDOSCOPY;  Service: Cardiovascular;  Laterality: N/A;   Social History   Occupational History   Not on file  Tobacco Use   Smoking status: Former    Types: Cigarettes    Quit date: 08/09/1976    Years since quitting: 46.3   Smokeless tobacco: Never  Substance and Sexual Activity   Alcohol use: No    Alcohol/week: 0.0 standard drinks of alcohol   Drug use: No   Sexual activity: Not on file

## 2023-01-20 ENCOUNTER — Other Ambulatory Visit: Payer: Self-pay | Admitting: Adult Health

## 2023-01-20 DIAGNOSIS — F419 Anxiety disorder, unspecified: Secondary | ICD-10-CM

## 2023-01-21 NOTE — Telephone Encounter (Signed)
Patient has request refill on medication Lorazepam 0.5mg . Patient medication last refilled 11/12/2022. Patient has no contract on file. Patient has upcoming appointment 02/11/2023. Sign Contract added to patient appointment notes. Medication pend and sent to PCP Medina-Vargas, Margit Banda, NP for approval.

## 2023-02-11 ENCOUNTER — Ambulatory Visit (INDEPENDENT_AMBULATORY_CARE_PROVIDER_SITE_OTHER): Payer: Medicare Other | Admitting: Adult Health

## 2023-02-11 VITALS — BP 122/78 | HR 84 | Temp 96.8°F | Resp 18 | Ht 65.0 in | Wt 153.4 lb

## 2023-02-11 DIAGNOSIS — Z8673 Personal history of transient ischemic attack (TIA), and cerebral infarction without residual deficits: Secondary | ICD-10-CM

## 2023-02-11 DIAGNOSIS — F01C0 Vascular dementia, severe, without behavioral disturbance, psychotic disturbance, mood disturbance, and anxiety: Secondary | ICD-10-CM | POA: Diagnosis not present

## 2023-02-11 DIAGNOSIS — I10 Essential (primary) hypertension: Secondary | ICD-10-CM | POA: Diagnosis not present

## 2023-02-11 DIAGNOSIS — E1122 Type 2 diabetes mellitus with diabetic chronic kidney disease: Secondary | ICD-10-CM | POA: Diagnosis not present

## 2023-02-11 DIAGNOSIS — G3184 Mild cognitive impairment, so stated: Secondary | ICD-10-CM

## 2023-02-11 DIAGNOSIS — N1832 Chronic kidney disease, stage 3b: Secondary | ICD-10-CM

## 2023-02-11 DIAGNOSIS — F419 Anxiety disorder, unspecified: Secondary | ICD-10-CM

## 2023-02-11 DIAGNOSIS — M159 Polyosteoarthritis, unspecified: Secondary | ICD-10-CM

## 2023-02-11 DIAGNOSIS — E782 Mixed hyperlipidemia: Secondary | ICD-10-CM

## 2023-02-11 NOTE — Progress Notes (Unsigned)
[facility] clinic  Provider: Kenard Gower DNP  Code Status:  Full Code  Goals of Care:     11/12/2022    2:11 PM  Advanced Directives  Does Patient Have a Medical Advance Directive? Yes  Does patient want to make changes to medical advance directive? No - Patient declined     Chief Complaint  Patient presents with   Follow-up    3 month follow up.   Immunizations    Influenza and Covid   Quality Metric Gaps    HPI: Patient is a 72 y.o. female seen today for a 10-month follow up of chronic medical issues. She was accompanied by her son. She refused bone density, flu vaccine, mammogram and colonoscopy.  Essential hypertension, benign - BP 122/78, takes Amlodipine  Type 2 diabetes mellitus with stage 3b chronic kidney disease, without long-term current use of insulin (HCC) -  A1C 6.8, not on medicine  Vascular dementia -  MMSE  2/30, takes Aricept  Hx of stroke without residual deficits -  takes Plavix and Rosuvastatin  Anxiety -  takes Ativan PRN    Past Medical History:  Diagnosis Date   Acute right ankle pain 05/25/2018   Diabetes mellitus    Hypertension    Pain in gums 02/25/2020   Peroneal tendinitis, right leg 05/30/2018   Pigmented skin lesion of uncertain nature 07/16/2019   Ms. Dalomba reports a 3 mo hx of a skin lesion, centrally located below her breasts. She is concerned because her sister developed breast cancer after noticing a spot on her breast. Patient reports no bleeding or itching. She states it is not getting progressively larger. She states she accidentally scraped it one day which was painful. She has never had anything like this before. She denies a fami   Retinopathy    Sarcoidosis    Screening mammogram, encounter for 06/29/2012   Stroke [facility])     Past Surgical History:  Procedure Laterality Date   EP IMPLANTABLE DEVICE N/A 09/29/2015   Procedure: Loop Recorder Insertion;  Surgeon: Marinus Maw, MD;  Location: [facility] INVASIVE CV LAB;   Service: Cardiovascular;  Laterality: N/A;   INTRAOCULAR LENS INSERTION     KNEE SURGERY  1999   PET/CT MONITORING/KAPOSIS/SARCOM ([facility] HX)     TEE WITHOUT CARDIOVERSION N/A 09/29/2015   Procedure: TRANSESOPHAGEAL ECHOCARDIOGRAM (TEE);  Surgeon: Laurey Morale, MD;  Location: [facility] ENDOSCOPY;  Service: Cardiovascular;  Laterality: N/A;    Allergies  Allergen Reactions   Lisinopril Cough   Metformin And Related Nausea Only    "severe itching"    Outpatient Encounter Medications as of 02/11/2023  Medication Sig   amLODipine (NORVASC) 10 MG tablet Take 10 mg by mouth daily.   clopidogrel (PLAVIX) 75 MG tablet Take 1 tablet (75 mg total) by mouth daily.   donepezil (ARICEPT) 5 MG tablet Take 1 tablet (5 mg total) by mouth at bedtime.   LORazepam (ATIVAN) 0.5 MG tablet TAKE ONE TABLET BY MOUTH AT BEDTIME AS NEEDED FOR ANXIETY   No facility-administered encounter medications on file as of 02/11/2023.    Review of Systems:  Review of Systems  Constitutional:  Negative for appetite change, chills, fatigue and fever.  HENT:  Negative for congestion, hearing loss, rhinorrhea and sore throat.   Eyes: Negative.   Respiratory:  Negative for cough, shortness of breath and wheezing.   Cardiovascular:  Negative for chest pain, palpitations and leg swelling.  Gastrointestinal:  Negative for abdominal pain, constipation, diarrhea, nausea  and vomiting.  Genitourinary:  Negative for dysuria.  Musculoskeletal:  Negative for arthralgias, back pain and myalgias.  Skin:  Negative for color change, rash and wound.  Neurological:  Negative for dizziness, weakness and headaches.  Psychiatric/Behavioral:  Negative for behavioral problems. The patient is not nervous/anxious.     Health Maintenance  Topic Date Due   COVID-19 Vaccine (1) Never done   Colonoscopy  Never done   COLON CANCER SCREENING ANNUAL FOBT  11/20/2009   MAMMOGRAM  01/07/2011   DEXA SCAN  Never done   OPHTHALMOLOGY EXAM  10/20/2016    Diabetic kidney evaluation - Urine ACR  02/10/2021   Medicare Annual Wellness (AWV)  03/23/2022   INFLUENZA VACCINE  01/13/2023   HEMOGLOBIN A1C  01/29/2023   Zoster Vaccines- Shingrix (1 of 2) 02/12/2023 (Originally 09/15/1969)   Pneumonia Vaccine 60+ Years old (1 of 2 - PCV) 11/12/2023 (Originally 09/15/1956)   Diabetic kidney evaluation - eGFR measurement  10/29/2023   LIPID PANEL  10/29/2023   FOOT EXAM  11/12/2023   Hepatitis C Screening  Completed   HPV VACCINES  Aged Out   DTaP/Tdap/Td  Discontinued    Physical Exam: Vitals:   02/11/23 1447  BP: 122/78  Pulse: 84  Resp: 18  Temp: (!) 96.8 F (36 C)  SpO2: 98%  Weight: 153 lb 6.4 oz (69.6 kg)  Height: 5\' 5"  (1.651 m)   Body mass index is 25.53 kg/m. Physical Exam Constitutional:      General: She is not in acute distress.    Appearance: Normal appearance.  HENT:     Head: Normocephalic and atraumatic.     Nose: Nose normal.     Mouth/Throat:     Mouth: Mucous membranes are moist.  Eyes:     Conjunctiva/sclera: Conjunctivae normal.  Cardiovascular:     Rate and Rhythm: Normal rate and regular rhythm.  Pulmonary:     Effort: Pulmonary effort is normal.     Breath sounds: Normal breath sounds.  Abdominal:     General: Bowel sounds are normal.     Palpations: Abdomen is soft.  Musculoskeletal:        General: Normal range of motion.     Cervical back: Normal range of motion.  Skin:    General: Skin is warm and dry.  Neurological:     General: No focal deficit present.     Mental Status: She is alert and oriented to person, place, and time.  Psychiatric:        Mood and Affect: Mood normal.        Behavior: Behavior normal.        Thought Content: Thought content normal.        Judgment: Judgment normal.     Labs reviewed: Basic Metabolic Panel: Recent Labs    05/27/22 1756 10/29/22 1520  NA 139 139  K 3.4* 4.1  CL 101 102  CO2 28 29  GLUCOSE 124* 112*  BUN 21 22  CREATININE 1.37* 1.39*   CALCIUM 9.6 9.5   Liver Function Tests: Recent Labs    10/29/22 1520  AST 18  ALT 9  BILITOT 0.5  PROT 7.6   No results for input(s): "LIPASE", "AMYLASE" in the last 8760 hours. No results for input(s): "AMMONIA" in the last 8760 hours. CBC: Recent Labs    05/27/22 1756 10/29/22 1520  WBC 5.9 5.2  NEUTROABS 4.0 3,572  HGB 11.7* 12.5  HCT 35.5* 38.3  MCV 84.1 83.6  PLT  272 287   Lipid Panel: Recent Labs    10/29/22 1520  CHOL 267*  HDL 83  LDLCALC 166*  TRIG 79  CHOLHDL 3.2   Lab Results  Component Value Date   HGBA1C 6.8 (H) 10/29/2022    Procedures since last visit: No results found.  Assessment/Plan  1. Essential hypertension, benign -  BP stable -  continue Amlodipine - Basic Metabolic Panel with eGFR  2. Type 2 diabetes mellitus with stage 3b chronic kidney disease, without long-term current use of insulin (HCC) -  diet-controlled - Hemoglobin A1C - Ambulatory referral to Ophthalmology - Microalbumin/Creatinine Ratio, Urine  3. Severe vascular dementia without behavioral disturbance, psychotic disturbance, mood disturbance, or anxiety (HCC) -  continue Aricept -  recommended reading, word puzzle and exercise  4. Hx of stroke without residual deficits -  stable -  continue Plavix and Rosuvastatin  5. Anxiety -  mood is stable -  continue Ativan PRN -  signed non-opioid contract  6. Mixed hyperlipidemia -  continue Rosuvastatin - Lipid panel  7. Primary osteoarthritis involving multiple joints -  Left knee and Right hip pain -  follows up with orthopedics   Labs/tests ordered:  lipid panel, BMP, A1C, urine microalbumin creatinine ratio  Next appt:  04/01/2023

## 2023-02-12 LAB — BASIC METABOLIC PANEL WITH GFR
BUN/Creatinine Ratio: 15 (calc) (ref 6–22)
BUN: 20 mg/dL (ref 7–25)
CO2: 26 mmol/L (ref 20–32)
Calcium: 10 mg/dL (ref 8.6–10.4)
Chloride: 103 mmol/L (ref 98–110)
Creat: 1.36 mg/dL — ABNORMAL HIGH (ref 0.60–1.00)
Glucose, Bld: 98 mg/dL (ref 65–99)
Potassium: 4.4 mmol/L (ref 3.5–5.3)
Sodium: 140 mmol/L (ref 135–146)
eGFR: 41 mL/min/{1.73_m2} — ABNORMAL LOW (ref >=60)

## 2023-02-12 LAB — LIPID PANEL
Cholesterol: 278 mg/dL — ABNORMAL HIGH (ref <200)
HDL: 89 mg/dL (ref >=50)
LDL Cholesterol (Calc): 173 mg/dL — ABNORMAL HIGH
Non-HDL Cholesterol (Calc): 189 mg/dL — ABNORMAL HIGH (ref <130)
Total CHOL/HDL Ratio: 3.1 (calc) (ref <5.0)
Triglycerides: 67 mg/dL (ref <150)

## 2023-02-12 LAB — HEMOGLOBIN A1C
Hgb A1c MFr Bld: 6.9 %{Hb} — ABNORMAL HIGH (ref <5.7)
Mean Plasma Glucose: 151 mg/dL
eAG (mmol/L): 8.4 mmol/L

## 2023-02-12 LAB — MICROALBUMIN / CREATININE URINE RATIO
Creatinine, Urine: 79 mg/dL (ref 20–275)
Microalb Creat Ratio: 291 mg/g{creat} — ABNORMAL HIGH (ref <30)
Microalb, Ur: 23 mg/dL

## 2023-02-14 NOTE — Progress Notes (Signed)
-    electrolytes within normal -  kidney function stable, same as previous -  A1C 6.9, up from 6.8 (10/29/22), eat low carb diet and exercise at least 150 minutes/week -   cholesterol and LDL elevated, exercise at least 150 minutes/week, eat whole foods and avoid processed foods, eat more vegetables

## 2023-03-18 ENCOUNTER — Other Ambulatory Visit: Payer: Self-pay | Admitting: Adult Health

## 2023-03-18 DIAGNOSIS — Z1212 Encounter for screening for malignant neoplasm of rectum: Secondary | ICD-10-CM

## 2023-03-18 DIAGNOSIS — Z1211 Encounter for screening for malignant neoplasm of colon: Secondary | ICD-10-CM

## 2023-03-25 ENCOUNTER — Ambulatory Visit (HOSPITAL_COMMUNITY)
Admission: EM | Admit: 2023-03-25 | Discharge: 2023-03-25 | Disposition: A | Discharge status: Home / Self Care | Payer: Medicare Other | Attending: Internal Medicine | Admitting: Internal Medicine

## 2023-03-25 ENCOUNTER — Encounter (HOSPITAL_COMMUNITY): Payer: Self-pay

## 2023-03-25 DIAGNOSIS — R519 Headache, unspecified: Secondary | ICD-10-CM | POA: Diagnosis not present

## 2023-03-25 NOTE — ED Triage Notes (Signed)
Headache and Back pain onset 2 days ago. Patient states the pain comes and goes. Has history of headaches.   Back pain in the neck and upper back. No recent falls or accidents. Patient having urinary frequency but no pain.

## 2023-03-25 NOTE — ED Provider Notes (Signed)
[facility]    CSN: [id number] Arrival date & time: 03/25/23  1116      History   Chief Complaint Chief Complaint  Patient presents with   Headache   Back Pain    HPI Wendy [name] is a 73 y.o. female.  Has been having headaches, has had similar headaches in the past, headache is frontal and occipital, comes and goes, no over-the-counter remedies tried.  Admits blurred vision and dizziness, these are not new symptom, and are unchanged. Pt is here with her son, who states she is her normal self, no change in mental status, no confusion,gait disturbance, recent fall or recent illness.   Headache Associated symptoms: back pain and congestion   Associated symptoms: no cough, no ear pain, no fever, no nausea, no numbness, no sore throat, no vomiting and no weakness   Back Pain Associated symptoms: headaches   Associated symptoms: no dysuria, no fever, no numbness and no weakness     Past Medical History:  Diagnosis Date   Acute right ankle pain 05/25/2018   Diabetes mellitus    Hypertension    Pain in gums 02/25/2020   Peroneal tendinitis, right leg 05/30/2018   Pigmented skin lesion of uncertain nature 07/16/2019   Ms. Kimbrough reports a 3 mo hx of a skin lesion, centrally located below her breasts. She is concerned because her sister developed breast cancer after noticing a spot on her breast. Patient reports no bleeding or itching. She states it is not getting progressively larger. She states she accidentally scraped it one day which was painful. She has never had anything like this before. She denies a fami   Retinopathy    Sarcoidosis    Screening mammogram, encounter for 06/29/2012   Stroke [facility])     Patient Active Problem List   Diagnosis Date Noted   Memory loss 10/01/2021   History of cardioembolic cerebrovascular accident (CVA) 10/20/2018   Unilateral primary osteoarthritis, right hip 05/30/2018   Osteoarthritis 03/10/2018   History of stroke  09/26/2015   CKD (chronic kidney disease) stage 3, GFR 30-59 ml/min (HCC) 10/14/2014   Background diabetic retinopathy (HCC) 07/25/2012   Sarcoidosis of lung (HCC) 09/30/2011   Hyperlipidemia 08/10/2011   Type 2 diabetes mellitus (HCC) 06/26/2008   Essential hypertension, benign 06/26/2008   Primary localized osteoarthritis of left knee 06/26/2008    Past Surgical History:  Procedure Laterality Date   EP IMPLANTABLE DEVICE N/A 09/29/2015   Procedure: Loop Recorder Insertion;  Surgeon: Marinus Maw, MD;  [facility];  Service: Cardiovascular;  Laterality: N/A;   INTRAOCULAR LENS INSERTION     KNEE SURGERY  1999   PET/CT MONITORING/KAPOSIS/SARCOM ([facility] HX)     TEE WITHOUT CARDIOVERSION N/A 09/29/2015   Procedure: TRANSESOPHAGEAL ECHOCARDIOGRAM (TEE);  Surgeon: Laurey Morale, MD;  Location: [facility] ENDOSCOPY;  Service: Cardiovascular;  Laterality: N/A;    OB History     Gravida  4   Para  4   Term      Preterm      AB      Living  4      SAB      IAB      Ectopic      Multiple      Live Births               Home Medications    Prior to Admission medications   Medication Sig Start Date End Date Taking? Authorizing Provider  amLODipine (NORVASC) 10 MG tablet Take 10 mg by mouth daily.   Yes [provider]  clopidogrel (PLAVIX) 75 MG tablet Take 1 tablet (75 mg total) by mouth daily. 10/29/22  Yes Medina-Vargas, Monina C, NP  donepezil (ARICEPT) 5 MG tablet Take 1 tablet (5 mg total) by mouth at bedtime. 10/29/22  Yes Medina-Vargas, Monina C, NP  LORazepam (ATIVAN) 0.5 MG tablet TAKE ONE TABLET BY MOUTH AT BEDTIME AS NEEDED FOR ANXIETY 01/21/23  Yes Medina-Vargas, Monina C, NP    Family History Family History  Problem Relation Age of Onset   Hypertension Son     Social History Social History   Tobacco Use   Smoking status: Former    Current packs/day: 0.00    Types: Cigarettes    Quit date: 08/09/1976    Years since quitting:  46.6   Smokeless tobacco: Never  Substance Use Topics   Alcohol use: No    Alcohol/week: 0.0 standard drinks of alcohol   Drug use: No     Allergies   Lisinopril and Metformin and related   Review of Systems Review of Systems  Constitutional:  Negative for chills and fever.  HENT:  Positive for congestion. Negative for ear pain and sore throat.   Respiratory:  Negative for cough.   Gastrointestinal:  Negative for nausea and vomiting.  Genitourinary:  Negative for difficulty urinating, dysuria, hematuria and urgency.  Musculoskeletal:  Positive for back pain.  Neurological:  Positive for headaches. Negative for facial asymmetry, weakness, light-headedness and numbness.     Physical Exam Triage Vital Signs ED Triage Vitals  Encounter Vitals Group     BP 03/25/23 1142 137/78     Systolic BP Percentile --      Diastolic BP Percentile --      Pulse Rate 03/25/23 1142 81     Resp 03/25/23 1142 18     Temp 03/25/23 1142 97.8 F (36.6 C)     Temp Source 03/25/23 1142 Oral     SpO2 03/25/23 1142 96 %     Weight 03/25/23 1142 153 lb 7 oz (69.6 kg)     Height 03/25/23 1142 5\' 5"  (1.651 m)     Head Circumference --      Peak Flow --      Pain Score 03/25/23 1140 6     Pain Loc --      Pain Education --      Exclude from Growth Chart --    No data found.  Updated Vital Signs BP 137/78 (BP Location: Left Arm)   Pulse 81   Temp 97.8 F (36.6 C) (Oral)   Resp 18   Ht 5\' 5"  (1.651 m)   Wt 153 lb 7 oz (69.6 kg)   SpO2 96%   BMI 25.53 kg/m   Visual Acuity Right Eye Distance:   Left Eye Distance:   Bilateral Distance:    Right Eye Near:   Left Eye Near:    Bilateral Near:     Physical Exam Vitals and nursing note reviewed.  Constitutional:      Appearance: She is not ill-appearing.  HENT:     Head: Normocephalic.  Eyes:     Extraocular Movements: Extraocular movements intact.     Right eye: No nystagmus.     Pupils: Pupils are equal, round, and reactive to  light.  Neck:     Comments: No tenderness, visible swelling or lesions Cardiovascular:     Rate and Rhythm: Normal rate.  Pulmonary:     Effort: Pulmonary effort is normal.  Musculoskeletal:     Cervical back: Neck supple. No rigidity.  Lymphadenopathy:     Cervical: No cervical adenopathy.  Neurological:     Mental Status: She is alert.     Cranial Nerves: No cranial nerve deficit.  Psychiatric:        Mood and Affect: Mood is not anxious.        Behavior: Behavior is not agitated.        Cognition and Memory: Cognition is not impaired. Memory is not impaired.      UC Treatments / Results  Labs (all labs ordered are listed, but only abnormal results are displayed) Labs Reviewed - No data to display  EKG   Radiology No results found.  Procedures Procedures (including critical care time)  Medications Ordered in UC Medications - No data to display  Initial Impression / Assessment and Plan / UC Course  I have reviewed the triage vital signs and the nursing notes.  Pertinent labs & imaging results that were available during my care of the patient were reviewed by me and considered in my medical decision making (see chart for details).     72 year old with history of strokes and dementia here for evaluation of headache and neck pain.  Is not taking any over-the-counter medications due to uncertainty as to what to take.  His worst headache of life, or new symptoms.  She is here with her son who denies any change in mental status, falls, concerns. His expressive aphasia on exam otherwise neuroexam is normal, signs are stable blood pressure 137/78.  Recommend OTC Tylenol  Chart review,  Patient has had previous strokes with residual expressive aphasia patient had an MRI 05/28/2022 following report IMPRESSION: 1. No acute intracranial abnormality. 2. Old left parietal infarct and findings of chronic small vessel ischemia.   Strict ED precautions reviewed with patient  and her son. Follow-up with PCP  Final Clinical Impressions(s) / UC Diagnoses   Final diagnoses:  Nonintractable headache, unspecified chronicity pattern, unspecified headache type     Discharge Instructions      Over-the-counter extra strength Tylenol as directed on the package for headache.  See your primary care doctor if the headaches persist.  Go to the emergency department for severe headache, change in headache quality compared to prior headaches, change in behavior Kniss,     ED Prescriptions   None    PDMP not reviewed this encounter.   Meliton Rattan, [place] 03/25/23 1220

## 2023-03-25 NOTE — Discharge Instructions (Addendum)
Over-the-counter extra strength Tylenol as directed on the package for headache.  See your primary care doctor if the headaches persist.  Go to the emergency department for severe headache, change in headache quality compared to prior headaches, change in behavior Kniss,

## 2023-04-01 ENCOUNTER — Encounter: Payer: PRIVATE HEALTH INSURANCE | Admitting: Family

## 2023-04-08 ENCOUNTER — Other Ambulatory Visit: Payer: Self-pay | Admitting: Adult Health

## 2023-04-08 ENCOUNTER — Encounter: Payer: Self-pay | Admitting: Adult Health

## 2023-04-08 ENCOUNTER — Ambulatory Visit (INDEPENDENT_AMBULATORY_CARE_PROVIDER_SITE_OTHER): Payer: Medicare Other | Admitting: Adult Health

## 2023-04-08 DIAGNOSIS — Z0000 Encounter for general adult medical examination without abnormal findings: Secondary | ICD-10-CM | POA: Diagnosis not present

## 2023-04-08 DIAGNOSIS — G3184 Mild cognitive impairment, so stated: Secondary | ICD-10-CM

## 2023-04-08 NOTE — Progress Notes (Signed)
This service is provided via telemedicine  No vital signs collected/recorded due to the encounter was a telemedicine visit.   Location of patient (ex: home, work):  Home   Patient consents to a telephone visit:  Yes, 04/08/23  Location of the provider (ex: office, home):  [facility]  Name of any referring provider:  Medina-Vargas, Margit Banda, NP   Names of all persons participating in the telemedicine service and their role in the encounter: Bethany B/CMA, Medina-Vargas, Maily Debarge C, NP , and patient  Time spent on call:  11 minutes       Subjective:   Wendy [name] is a 72 y.o. female who presents for Medicare Annual (Subsequent) preventive examination.  Visit Complete: Virtual I connected with  Antony Haste on 04/08/23 by a video and audio enabled telemedicine application and verified that I am speaking with the correct person using two identifiers.  Patient Location: Home  Provider Location: Office/Clinic  I discussed the limitations of evaluation and management by telemedicine. The patient expressed understanding and agreed to proceed.  Vital Signs: Because this visit was a virtual/telehealth visit, some criteria may be missing or patient reported. Any vitals not documented were not able to be obtained and vitals that have been documented are patient reported.  Patient Medicare AWV questionnaire was completed by the patient on 04/08/23; I have confirmed that all information answered by patient is correct and no changes since this date.  Cardiac Risk Factors include: advanced age (>26men, >63 women);diabetes mellitus;hypertension     Objective:    Today's Vitals   04/08/23 1358  PainSc: 7    There is no height or weight on file to calculate BMI.     04/08/2023    1:26 PM 11/12/2022    2:11 PM 10/29/2022    2:07 PM 05/27/2022    4:41 PM 10/13/2020    2:35 PM 07/17/2020    3:15 PM 05/16/2020   10:46 AM  Advanced Directives  Does Patient  Have a Medical Advance Directive? No Yes Yes No Yes Yes Yes  Type of Agricultural consultant;Living will Healthcare Power of [place];Living will Healthcare Power of [place];Living will  Does patient want to make changes to medical advance directive?  No - Patient declined No - Patient declined      Copy of Healthcare Power of Attorney in Chart?     No - copy requested No - copy requested No - copy requested  Would patient like information on creating a medical advance directive? No - Patient declined   No - Patient declined No - Patient declined No - Patient declined No - Patient declined    Current Medications (verified) Outpatient Encounter Medications as of 04/08/2023  Medication Sig   amLODipine (NORVASC) 10 MG tablet Take 10 mg by mouth daily.   clopidogrel (PLAVIX) 75 MG tablet Take 1 tablet (75 mg total) by mouth daily.   donepezil (ARICEPT) 5 MG tablet Take 1 tablet (5 mg total) by mouth at bedtime.   LORazepam (ATIVAN) 0.5 MG tablet TAKE ONE TABLET BY MOUTH AT BEDTIME AS NEEDED FOR ANXIETY   No facility-administered encounter medications on file as of 04/08/2023.    Allergies (verified) Lisinopril and Metformin and related   History: Past Medical History:  Diagnosis Date   Acute right ankle pain 05/25/2018   Diabetes mellitus    Hypertension    Pain in gums 02/25/2020   Peroneal tendinitis, right  leg 05/30/2018   Pigmented skin lesion of uncertain nature 07/16/2019   Ms. Mccarron reports a 3 mo hx of a skin lesion, centrally located below her breasts. She is concerned because her sister developed breast cancer after noticing a spot on her breast. Patient reports no bleeding or itching. She states it is not getting progressively larger. She states she accidentally scraped it one day which was painful. She has never had anything like this before. She denies a fami   Retinopathy    Sarcoidosis    Screening mammogram, encounter for 06/29/2012   Stroke  [facility])    Past Surgical History:  Procedure Laterality Date   EP IMPLANTABLE DEVICE N/A 09/29/2015   Procedure: Loop Recorder Insertion;  Surgeon: Marinus Maw, MD;  [facility];  Service: Cardiovascular;  Laterality: N/A;   INTRAOCULAR LENS INSERTION     KNEE SURGERY  1999   PET/CT MONITORING/KAPOSIS/SARCOM ([facility] HX)     TEE WITHOUT CARDIOVERSION N/A 09/29/2015   Procedure: TRANSESOPHAGEAL ECHOCARDIOGRAM (TEE);  Surgeon: Laurey Morale, MD;  Location: [facility] ENDOSCOPY;  Service: Cardiovascular;  Laterality: N/A;   Family History  Problem Relation Age of Onset   Hypertension Son    Social History   Socioeconomic History   Marital status: Married    Spouse name: Not on file   Number of children: Not on file   Years of education: Not on file   Highest education level: Associate degree: occupational, Scientist, product/process development, or vocational program  Occupational History   Not on file  Tobacco Use   Smoking status: Former    Current packs/day: 0.00    Types: Cigarettes    Quit date: 08/09/1976    Years since quitting: 46.6   Smokeless tobacco: Never  Substance and Sexual Activity   Alcohol use: No    Alcohol/week: 0.0 standard drinks of alcohol   Drug use: No   Sexual activity: Not on file  Other Topics Concern   Not on file  Social History Narrative   Not on file   Social Determinants of Health   Financial Resource Strain: Low Risk  (02/11/2023)   Overall Financial Resource Strain (CARDIA)    Difficulty of Paying Living Expenses: Not very hard  Food Insecurity: Patient Declined (02/11/2023)   Hunger Vital Sign    Worried About Running Out of Food in the Last Year: Patient declined    Ran Out of Food in the Last Year: Patient declined  Transportation Needs: Unmet Transportation Needs (02/11/2023)   PRAPARE - Administrator, Civil Service (Medical): Yes    Lack of Transportation (Non-Medical): No  Physical Activity: Insufficiently Active (02/11/2023)    Exercise Vital Sign    Days of Exercise per Week: 1 day    Minutes of Exercise per Session: 10 min  Stress: Stress Concern Present (02/11/2023)   [employer] of Occupational Health - Occupational Stress Questionnaire    Feeling of Stress : Very much  Social Connections: Moderately Integrated (02/11/2023)   Social Connection and Isolation Panel [NHANES]    Frequency of Communication with Friends and Family: More than three times a week    Frequency of Social Gatherings with Friends and Family: Once a week    Attends Religious Services: More than 4 times per year    Active Member of [employer] or Organizations: No    Attends Engineer, structural: Not on file    Marital Status: Married    Tobacco Counseling Counseling given: Not  Answered   Clinical Intake:  Pre-visit preparation completed: No  Pain : 0-10 Pain Score: 7  Pain Location: Other (Comment) (headache) Pain Orientation: Right Pain Radiating Towards: No Pain Descriptors / Indicators: Throbbing Pain Onset: Today Pain Frequency: Occasional Pain Relieving Factors: Tylenol Effect of Pain on Daily Activities: Nothing  Pain Relieving Factors: Tylenol  BMI - recorded: 25.53 Nutritional Status: BMI 25 -29 Overweight Nutritional Risks: None Diabetes: Yes (A1C 6.9) CBG done?: No Did pt. bring in CBG monitor from home?: No  How often do you need to have someone help you when you read instructions, pamphlets, or other written materials from your doctor or pharmacy?: 2 - Rarely What is the last grade level you completed in school?: Associate degree  Interpreter Needed?: No  Information entered by :: Aylee Littrell Medina-Vargas DNP   Activities of Daily Living    04/08/2023    2:15 PM 04/08/2023    1:31 PM  In your present state of health, do you have any difficulty performing the following activities:  Hearing? 0 1  Vision? 0 1  Difficulty concentrating or making decisions? 1 0  Walking or climbing stairs? 0 1   Dressing or bathing? 0 0  Doing errands, shopping? 1 0  Preparing Food and eating ? N   Using the Toilet? N   In the past six months, have you accidently leaked urine? Y   Do you have problems with loss of bowel control? N   Managing your Medications? N   Managing your Finances? Y   Housekeeping or managing your Housekeeping? Y     Patient Care Team: Medina-Vargas, Margit Banda, NP as PCP - General (Internal Medicine)  Indicate any recent Medical Services you may have received from other than [facility] providers in the past year (date may be approximate).     Assessment:   This is a routine wellness examination for Wendy [name].  Hearing/Vision screen No results found.   Goals Addressed             This Visit's Progress    enhance memory and speech       -  Will read books -  will do memory books      Depression Screen    04/08/2023    1:42 PM 11/12/2022    2:12 PM 10/29/2022    2:08 PM 10/01/2021   12:09 PM 11/04/2020    3:26 PM 10/13/2020    2:39 PM 03/24/2020    4:33 PM  PHQ 2/9 Scores  PHQ - 2 Score 0 0 0 0 1    PHQ- 9 Score     4    Exception Documentation      Patient refusal --    Fall Risk    04/08/2023    1:42 PM 10/29/2022    2:08 PM 10/01/2021   12:09 PM 10/13/2020    2:33 PM 07/17/2020    3:14 PM  Fall Risk   Falls in the past year? 0 0 0 1 0  Number falls in past yr: 0 0 0 1   Injury with Fall? 0 0 0 1   Risk for fall due to : History of fall(s) History of fall(s)  History of fall(s);Impaired balance/gait   Follow up Falls evaluation completed Falls evaluation completed  Falls prevention discussed Falls prevention discussed    MEDICARE RISK AT HOME:    TIMED UP AND GO:  Was the test performed?  No    Cognitive Function:    10/29/2022  2:41 PM  MMSE - Mini Mental State Exam  Orientation to time 0  Orientation to Place 5  Registration 2  Attention/ Calculation 5  Recall 0  Language- name 2 objects 2  Language- repeat 1  Language- follow 3 step  command 3  Language- read & follow direction 1  Write a sentence 1  Copy design 1  Total score 21        04/08/2023    1:27 PM  6CIT Screen  What Year? 0 points  What month? 0 points  What time? 0 points  Count back from 20 0 points  Months in reverse 4 points  Repeat phrase 2 points  Total Score 6 points    Immunizations Immunization History  Administered Date(s) Administered   Influenza Split 08/10/2011   Influenza Whole 05/11/2010   Influenza, Seasonal, Injecte, Preservative Fre 06/15/2012    TDAP status: Due, Education has been provided regarding the importance of this vaccine. Advised may receive this vaccine at local pharmacy or Health Dept. Aware to provide a copy of the vaccination record if obtained from local pharmacy or Health Dept. Verbalized acceptance and understanding.  Flu Vaccine status: Declined, Education has been provided regarding the importance of this vaccine but patient still declined. Advised may receive this vaccine at local pharmacy or Health Dept. Aware to provide a copy of the vaccination record if obtained from local pharmacy or Health Dept. Verbalized acceptance and understanding.  Pneumococcal vaccine status: Due, Education has been provided regarding the importance of this vaccine. Advised may receive this vaccine at local pharmacy or Health Dept. Aware to provide a copy of the vaccination record if obtained from local pharmacy or Health Dept. Verbalized acceptance and understanding.  Covid-19 vaccine status: Declined, Education has been provided regarding the importance of this vaccine but patient still declined. Advised may receive this vaccine at local pharmacy or Health Dept.or vaccine clinic. Aware to provide a copy of the vaccination record if obtained from local pharmacy or Health Dept. Verbalized acceptance and understanding.  Qualifies for Shingles Vaccine? Yes   Zostavax completed No   Shingrix Completed?: No.    Education has been  provided regarding the importance of this vaccine. Patient has been advised to call insurance company to determine out of pocket expense if they have not yet received this vaccine. Advised may also receive vaccine at local pharmacy or Health Dept. Verbalized acceptance and understanding.  Screening Tests Health Maintenance  Topic Date Due   COVID-19 Vaccine (1) Never done   Zoster Vaccines- Shingrix (1 of 2) Never done   Fecal DNA (Cologuard)  Never done   MAMMOGRAM  01/07/2011   DEXA SCAN  Never done   OPHTHALMOLOGY EXAM  10/20/2016   COLON CANCER SCREENING ANNUAL FOBT  08/14/2023 (Originally 11/20/2009)   Pneumonia Vaccine 64+ Years old (1 of 2 - PCV) 11/12/2023 (Originally 09/15/1956)   INFLUENZA VACCINE  02/06/2024 (Originally 01/13/2023)   HEMOGLOBIN A1C  05/14/2023   FOOT EXAM  11/12/2023   Diabetic kidney evaluation - eGFR measurement  02/11/2024   Diabetic kidney evaluation - Urine ACR  02/11/2024   LIPID PANEL  02/11/2024   Medicare Annual Wellness (AWV)  04/07/2024   Hepatitis C Screening  Completed   HPV VACCINES  Aged Out   DTaP/Tdap/Td  Discontinued    Health Maintenance  Health Maintenance Due  Topic Date Due   COVID-19 Vaccine (1) Never done   Zoster Vaccines- Shingrix (1 of 2) Never done   Fecal DNA (  Cologuard)  Never done   MAMMOGRAM  01/07/2011   DEXA SCAN  Never done   OPHTHALMOLOGY EXAM  10/20/2016    Colorectal cancer screening: will think about it.  Mammogram status: Ordered 10/29/22. Pt provided with contact info and advised to call to schedule appt.   Bone Density status: Ordered 10/29/22. Pt provided with contact info and advised to call to schedule appt.  Lung Cancer Screening: (Low Dose CT Chest recommended if Age 42-80 years, 20 pack-year currently smoking OR have quit w/in 15years.) does not qualify.   Lung Cancer Screening Referral: No  Additional Screening:  Hepatitis C Screening: does qualify; Completed 10/29/22  Vision Screening: Recommended  annual ophthalmology exams for early detection of glaucoma and other disorders of the eye. Is the patient up to date with their annual eye exam?   Who is the provider or what is the name of the office in which the patient attends annual eye exams?  [facility] If pt is not established with a provider, would they like to be referred to a provider to establish care? No .   Dental Screening: Recommended annual dental exams for proper oral hygiene  Diabetic Foot Exam: Diabetic Foot Exam: Overdue, Pt has been advised about the importance in completing this exam. Pt is scheduled for diabetic foot exam on will follow up in office.  Community Resource Referral / Chronic Care Management: CRR required this visit?  No   CCM required this visit?  No     Plan:     I have personally reviewed and noted the following in the patient's chart:   Medical and social history Use of alcohol, tobacco or illicit drugs  Current medications and supplements including opioid prescriptions. Patient is not currently taking opioid prescriptions. Functional ability and status Nutritional status Physical activity Advanced directives List of other physicians Hospitalizations, surgeries, and ER visits in previous 12 months Vitals Screenings to include cognitive, depression, and falls Referrals and appointments  In addition, I have reviewed and discussed with patient certain preventive protocols, quality metrics, and best practice recommendations. A written personalized care plan for preventive services as well as general preventive health recommendations were provided to patient.     Daeshawn Redmann Medina-Vargas, NP   04/08/2023   After Visit Summary: (MyChart) Due to this being a telephonic visit, the after visit summary with patients personalized plan was offered to patient via MyChart   Nurse Notes: needs to be done annually.

## 2023-04-08 NOTE — Progress Notes (Signed)
I connected with  Wendy [name] on 04/08/23 by a video enabled telemedicine application and verified that I am speaking with the correct person using two identifiers.   I discussed the limitations of evaluation and management by telemedicine. The patient expressed understanding and agreed to proceed.

## 2023-04-08 NOTE — Patient Instructions (Signed)
  Wendy [name] , Thank you for taking time to come for your Medicare Wellness Visit. I appreciate your ongoing commitment to your health goals. Please review the following plan we discussed and let me know if I can assist you in the future.   These are the goals we discussed:  Goals      Blood Pressure < 140/90     enhance memory and speech     -  Will read books -  will do memory books     HEMOGLOBIN A1C < 7.5     LDL CALC < 100        This is a list of the screening recommended for you and due dates:  Health Maintenance  Topic Date Due   COVID-19 Vaccine (1) Never done   Zoster (Shingles) Vaccine (1 of 2) Never done   Cologuard (Stool DNA test)  Never done   Mammogram  01/07/2011   DEXA scan (bone density measurement)  Never done   Eye exam for diabetics  10/20/2016   Stool Blood Test  08/14/2023*   Pneumonia Vaccine (1 of 2 - PCV) 11/12/2023*   Flu Shot  02/06/2024*   Hemoglobin A1C  05/14/2023   Complete foot exam   11/12/2023   Yearly kidney function blood test for diabetes  02/11/2024   Yearly kidney health urinalysis for diabetes  02/11/2024   Lipid (cholesterol) test  02/11/2024   Medicare Annual Wellness Visit  04/07/2024   Hepatitis C Screening  Completed   HPV Vaccine  Aged Out   DTaP/Tdap/Td vaccine  Discontinued  *Topic was postponed. The date shown is not the original due date.

## 2023-05-16 ENCOUNTER — Ambulatory Visit (INDEPENDENT_AMBULATORY_CARE_PROVIDER_SITE_OTHER): Payer: Medicare Other | Admitting: Adult Health

## 2023-05-16 ENCOUNTER — Encounter: Payer: Self-pay | Admitting: Adult Health

## 2023-05-16 VITALS — BP 127/78 | HR 100 | Temp 97.6°F | Resp 18 | Ht 65.0 in | Wt 152.6 lb

## 2023-05-16 DIAGNOSIS — Z8673 Personal history of transient ischemic attack (TIA), and cerebral infarction without residual deficits: Secondary | ICD-10-CM

## 2023-05-16 DIAGNOSIS — E1122 Type 2 diabetes mellitus with diabetic chronic kidney disease: Secondary | ICD-10-CM | POA: Diagnosis not present

## 2023-05-16 DIAGNOSIS — F01C0 Vascular dementia, severe, without behavioral disturbance, psychotic disturbance, mood disturbance, and anxiety: Secondary | ICD-10-CM

## 2023-05-16 DIAGNOSIS — H259 Unspecified age-related cataract: Secondary | ICD-10-CM

## 2023-05-16 DIAGNOSIS — F419 Anxiety disorder, unspecified: Secondary | ICD-10-CM

## 2023-05-16 DIAGNOSIS — I10 Essential (primary) hypertension: Secondary | ICD-10-CM

## 2023-05-16 DIAGNOSIS — N1832 Chronic kidney disease, stage 3b: Secondary | ICD-10-CM

## 2023-05-16 DIAGNOSIS — R4189 Other symptoms and signs involving cognitive functions and awareness: Secondary | ICD-10-CM

## 2023-05-16 DIAGNOSIS — E782 Mixed hyperlipidemia: Secondary | ICD-10-CM

## 2023-05-16 NOTE — Progress Notes (Unsigned)
[facility]  Provider:  Kenard Gower DNP  Code Status:  Full Code  Goals of Care:     04/08/2023    1:26 PM  Advanced Directives  Does Patient Have a Medical Advance Directive? No  Would patient like information on creating a medical advance directive? No - Patient declined     Chief Complaint  Patient presents with   Medical Management of Chronic Issues    3 months follow-up   Immunizations    Shingrix and Covid   Health Maintenance    Cologuard    HPI: Patient is a 72 y.o. female seen today for a 3 -month follow up of chronic medical issues.  Self-care deficit for medication administration -  son does not know if she takes her daily medications  Hx of stroke without residual deficits -  ambulates with a cane, takes Rosuvastatin and Plavix  Essential hypertension, benign - BP 127/78, takes Amlodipine  Type 2 diabetes mellitus with stage 3b chronic kidney disease, without long-term current use of insulin (HCC) -  A1C 6.9, 02/11/23, not on medication  Severe vascular dementia without behavioral disturbance, psychotic disturbance, mood disturbance, or anxiety (HCC) - confusing stories, cannot tell if she is taking medication, supposedly taking Aricept, argues with son   Wt Readings from Last 3 Encounters:  05/16/23 152 lb 9.6 oz (69.2 kg)  03/25/23 153 lb 7 oz (69.6 kg)  02/11/23 153 lb 6.4 oz (69.6 kg)      Past Medical History:  Diagnosis Date   Acute right ankle pain 05/25/2018   Diabetes mellitus    Hypertension    Pain in gums 02/25/2020   Peroneal tendinitis, right leg 05/30/2018   Pigmented skin lesion of uncertain nature 07/16/2019   Ms. Hanley reports a 3 mo hx of a skin lesion, centrally located below her breasts. She is concerned because her sister developed breast cancer after noticing a spot on her breast. Patient reports no bleeding or itching. She states it is not getting progressively larger. She states she accidentally scraped it one day  which was painful. She has never had anything like this before. She denies a fami   Retinopathy    Sarcoidosis    Screening mammogram, encounter for 06/29/2012   Stroke [facility])     Past Surgical History:  Procedure Laterality Date   EP IMPLANTABLE DEVICE N/A 09/29/2015   Procedure: Loop Recorder Insertion;  Surgeon: Marinus Maw, MD;  Location: [facility] INVASIVE CV LAB;  Service: Cardiovascular;  Laterality: N/A;   INTRAOCULAR LENS INSERTION     KNEE SURGERY  1999   PET/CT MONITORING/KAPOSIS/SARCOM ([facility] HX)     TEE WITHOUT CARDIOVERSION N/A 09/29/2015   Procedure: TRANSESOPHAGEAL ECHOCARDIOGRAM (TEE);  Surgeon: Laurey Morale, MD;  Location: [facility] ENDOSCOPY;  Service: Cardiovascular;  Laterality: N/A;    Allergies  Allergen Reactions   Lisinopril Cough   Metformin And Related Nausea Only    "severe itching"    Outpatient Encounter Medications as of 05/16/2023  Medication Sig   amLODipine (NORVASC) 10 MG tablet Take 10 mg by mouth daily.   clopidogrel (PLAVIX) 75 MG tablet Take 1 tablet (75 mg total) by mouth daily.   donepezil (ARICEPT) 5 MG tablet TAKE ONE TABLET BY MOUTH AT BEDTIME   LORazepam (ATIVAN) 0.5 MG tablet TAKE ONE TABLET BY MOUTH AT BEDTIME AS NEEDED FOR ANXIETY   No facility-administered encounter medications on file as of 05/16/2023.    Review of Systems:  Review of Systems  Constitutional:  Negative for appetite change, chills, fatigue and fever.  HENT:  Negative for congestion, hearing loss, rhinorrhea and sore throat.   Eyes:  Positive for visual disturbance.       Sees black spots on both eyes  Respiratory:  Negative for cough, shortness of breath and wheezing.   Cardiovascular:  Negative for chest pain, palpitations and leg swelling.  Gastrointestinal:  Negative for abdominal pain, constipation, diarrhea, nausea and vomiting.  Genitourinary:  Negative for dysuria.  Musculoskeletal:  Negative for arthralgias, back pain and myalgias.       Uses cane when walking   Skin:  Negative for color change, rash and wound.  Neurological:  Negative for dizziness, weakness and headaches.  Psychiatric/Behavioral:  Negative for behavioral problems. The patient is not nervous/anxious.     Health Maintenance  Topic Date Due   COVID-19 Vaccine (1) Never done   Zoster Vaccines- Shingrix (1 of 2) Never done   Fecal DNA (Cologuard)  Never done   MAMMOGRAM  01/07/2011   DEXA SCAN  Never done   OPHTHALMOLOGY EXAM  10/20/2016   HEMOGLOBIN A1C  05/14/2023   COLON CANCER SCREENING ANNUAL FOBT  08/14/2023 (Originally 11/20/2009)   Pneumonia Vaccine 31+ Years old (1 of 2 - PCV) 11/12/2023 (Originally 09/15/1956)   INFLUENZA VACCINE  02/06/2024 (Originally 01/13/2023)   FOOT EXAM  11/12/2023   Diabetic kidney evaluation - eGFR measurement  02/11/2024   Diabetic kidney evaluation - Urine ACR  02/11/2024   LIPID PANEL  02/11/2024   Medicare Annual Wellness (AWV)  04/07/2024   Hepatitis C Screening  Completed   HPV VACCINES  Aged Out   DTaP/Tdap/Td  Discontinued    Physical Exam: Vitals:   05/16/23 1303  BP: 127/78  Pulse: 100  Resp: 18  Temp: 97.6 F (36.4 C)  SpO2: 99%  Weight: 152 lb 9.6 oz (69.2 kg)  Height: 5\' 5"  (1.651 m)   Body mass index is 25.39 kg/m. Physical Exam Constitutional:      Appearance: Normal appearance.  HENT:     Head: Normocephalic and atraumatic.     Nose: Nose normal.     Mouth/Throat:     Mouth: Mucous membranes are moist.  Eyes:     Conjunctiva/sclera: Conjunctivae normal.  Cardiovascular:     Rate and Rhythm: Normal rate and regular rhythm.  Pulmonary:     Effort: Pulmonary effort is normal.     Breath sounds: Normal breath sounds.  Abdominal:     General: Bowel sounds are normal.     Palpations: Abdomen is soft.  Musculoskeletal:        General: Normal range of motion.     Cervical back: Normal range of motion.  Skin:    General: Skin is warm and dry.  Neurological:     General: No focal deficit present.     Mental  Status: She is alert and oriented to person, place, and time.  Psychiatric:        Mood and Affect: Mood normal.        Behavior: Behavior normal.        Thought Content: Thought content normal.        Judgment: Judgment normal.     Labs reviewed: Basic Metabolic Panel: Recent Labs    05/27/22 1756 10/29/22 1520 02/11/23 1524  NA 139 139 140  K 3.4* 4.1 4.4  CL 101 102 103  CO2 28 29 26   GLUCOSE 124* 112* 98  BUN 21 22 20  CREATININE 1.37* 1.39* 1.36*  CALCIUM 9.6 9.5 10.0   Liver Function Tests: Recent Labs    10/29/22 1520  AST 18  ALT 9  BILITOT 0.5  PROT 7.6   No results for input(s): "LIPASE", "AMYLASE" in the last 8760 hours. No results for input(s): "AMMONIA" in the last 8760 hours. CBC: Recent Labs    05/27/22 1756 10/29/22 1520  WBC 5.9 5.2  NEUTROABS 4.0 3,572  HGB 11.7* 12.5  HCT 35.5* 38.3  MCV 84.1 83.6  PLT 272 287   Lipid Panel: Recent Labs    10/29/22 1520 02/11/23 1524  CHOL 267* 278*  HDL 83 89  LDLCALC 166* 173*  TRIG 79 67  CHOLHDL 3.2 3.1   Lab Results  Component Value Date   HGBA1C 6.9 (H) 02/11/2023    Procedures since last visit: No results found.  Assessment/Plan     Labs/tests ordered:  A1C, CBC, BMP, lipid panel  Next appt:  Visit date not found

## 2023-05-17 ENCOUNTER — Other Ambulatory Visit: Payer: Self-pay | Admitting: Adult Health

## 2023-05-17 DIAGNOSIS — E782 Mixed hyperlipidemia: Secondary | ICD-10-CM

## 2023-05-17 LAB — LIPID PANEL
Cholesterol: 253 mg/dL — ABNORMAL HIGH (ref <200)
HDL: 89 mg/dL (ref >=50)
LDL Cholesterol (Calc): 146 mg/dL — ABNORMAL HIGH
Non-HDL Cholesterol (Calc): 164 mg/dL — ABNORMAL HIGH (ref <130)
Total CHOL/HDL Ratio: 2.8 (calc) (ref <5.0)
Triglycerides: 75 mg/dL (ref <150)

## 2023-05-17 LAB — CBC WITH DIFFERENTIAL/PLATELET
Absolute Lymphocytes: 1172 {cells}/uL (ref 850–3900)
Absolute Monocytes: 385 {cells}/uL (ref 200–950)
Basophils Absolute: 50 {cells}/uL (ref 0–200)
Basophils Relative: 0.9 %
Eosinophils Absolute: 83 {cells}/uL (ref 15–500)
Eosinophils Relative: 1.5 %
HCT: 39.7 % (ref 35.0–45.0)
Hemoglobin: 13 g/dL (ref 11.7–15.5)
MCH: 27.4 pg (ref 27.0–33.0)
MCHC: 32.7 g/dL (ref 32.0–36.0)
MCV: 83.6 fL (ref 80.0–100.0)
MPV: 10.9 fL (ref 7.5–12.5)
Monocytes Relative: 7 %
Neutro Abs: 3812 {cells}/uL (ref 1500–7800)
Neutrophils Relative %: 69.3 %
Platelets: 264 10*3/uL (ref 140–400)
RBC: 4.75 10*6/uL (ref 3.80–5.10)
RDW: 13.9 % (ref 11.0–15.0)
Total Lymphocyte: 21.3 %
WBC: 5.5 10*3/uL (ref 3.8–10.8)

## 2023-05-17 LAB — BASIC METABOLIC PANEL WITH GFR
BUN/Creatinine Ratio: 16 (calc) (ref 6–22)
BUN: 22 mg/dL (ref 7–25)
CO2: 30 mmol/L (ref 20–32)
Calcium: 9.6 mg/dL (ref 8.6–10.4)
Chloride: 101 mmol/L (ref 98–110)
Creat: 1.4 mg/dL — ABNORMAL HIGH (ref 0.60–1.00)
Glucose, Bld: 119 mg/dL — ABNORMAL HIGH (ref 65–99)
Potassium: 4.1 mmol/L (ref 3.5–5.3)
Sodium: 139 mmol/L (ref 135–146)
eGFR: 40 mL/min/{1.73_m2} — ABNORMAL LOW (ref >=60)

## 2023-05-17 LAB — HEMOGLOBIN A1C
Hgb A1c MFr Bld: 6.7 %{Hb} — ABNORMAL HIGH (ref <5.7)
Mean Plasma Glucose: 146 mg/dL
eAG (mmol/L): 8.1 mmol/L

## 2023-05-17 MED ORDER — ATORVASTATIN CALCIUM 20 MG PO TABS: 20.0000 mg | ORAL_TABLET | Freq: Every day | ORAL | 3 refills | Status: DC

## 2023-05-17 NOTE — Progress Notes (Signed)
-    cholesterol 253, down from 278 (02/11/23) -  triglycerides normal -   LDL 146, down from 173, I wan to start her on statin, will send eRx to pharmacy -  not anemic -  electrolytes normal -  kidney function stable -  A1C 6.7, down from 6.9

## 2023-05-27 ENCOUNTER — Telehealth: Payer: Self-pay

## 2023-05-27 NOTE — Telephone Encounter (Signed)
Chrissie Noa from [facility] called and states that he went to see patient. He states that she has vascular dementia and would benefit from speech therapy, OT, and PT. He requested that patient have Child psychotherapist and he also mentioned that patient has no caregiver. Verbal orders given. Message routed to PCP Medina-Vargas, Monina C, NP as FYI.

## 2023-05-27 NOTE — Addendum Note (Signed)
Addended by: Kenard Gower C on: 05/27/2023 04:09 PM   Modules accepted: Orders

## 2023-06-03 NOTE — Telephone Encounter (Signed)
Myrtie Neither called from [facility] and requested speech therapy for patient once a week for 8 weeks. Verbal orders given.

## 2023-06-09 ENCOUNTER — Telehealth: Payer: Medicare Other

## 2023-06-09 DIAGNOSIS — F419 Anxiety disorder, unspecified: Secondary | ICD-10-CM

## 2023-06-09 DIAGNOSIS — G3184 Mild cognitive impairment, so stated: Secondary | ICD-10-CM

## 2023-06-09 MED ORDER — DONEPEZIL HCL 5 MG PO TABS: 5.0000 mg | ORAL_TABLET | Freq: Every day | ORAL | 3 refills | Status: AC

## 2023-06-09 NOTE — Telephone Encounter (Signed)
Patients son called on behalf of his mother requesting a refill for lorazepam and donepezil   Aricept was filled and lorazepam pended for provider as it is a controlled substance.  Patient is requesting a refill of the following medications: Requested Prescriptions   Pending Prescriptions Disp Refills   LORazepam (ATIVAN) 0.5 MG tablet 30 tablet 5    Sig: TAKE ONE TABLET BY MOUTH AT BEDTIME AS NEEDED FOR ANXIETY   Signed Prescriptions Disp Refills   donepezil (ARICEPT) 5 MG tablet 90 tablet 3    Sig: Take 1 tablet (5 mg total) by mouth at bedtime.    Authorizing Provider: Gillis Santa    Ordering User: Edison Simon C    Date of last refill: 01/21/23  Refill amount: 30/4  Treatment agreement date: not on file, notation made on pending appointment for 09/05/23

## 2023-06-10 MED ORDER — LORAZEPAM 0.5 MG PO TABS: ORAL_TABLET | ORAL | 0 refills | Status: DC

## 2023-06-10 NOTE — Telephone Encounter (Signed)
Medina-Vargas, Monina C, NP  YouJust now (10:19 AM)    Ativan eRx sent to pharmacy. FYI.

## 2023-06-24 ENCOUNTER — Telehealth: Payer: Self-pay

## 2023-06-24 NOTE — Telephone Encounter (Signed)
 Juan Quam the PT from [facility] is calling because he had a Add on Evaluation for the patient but the patient declined the service. Physical Therapist was calling to give an [place]

## 2023-06-24 NOTE — Telephone Encounter (Signed)
 noted

## 2023-07-01 ENCOUNTER — Telehealth: Payer: Self-pay

## 2023-07-01 NOTE — Telephone Encounter (Signed)
Mirica, RN is calling to give you a heads up regarding this patient. She states she was doing a discharge on this patient and was asking the family has she been given her medication because the patient has dementia. The family all stated that they didn't know if she has been. The nurse is concerned because it appears she has not been taking the medications and told family she can not just stop the Plavix or she can have a stroke none of the family responded. She states she can be reached at [phone]

## 2023-07-01 NOTE — Telephone Encounter (Signed)
I called and spoke with patients son he states they will call back and schedule later today

## 2023-08-25 ENCOUNTER — Other Ambulatory Visit: Payer: Self-pay

## 2023-08-25 ENCOUNTER — Emergency Department (HOSPITAL_COMMUNITY)

## 2023-08-25 ENCOUNTER — Emergency Department (HOSPITAL_COMMUNITY)
Admission: EM | Admit: 2023-08-25 | Discharge: 2023-08-25 | Disposition: A | Discharge status: Home / Self Care | Attending: Emergency Medicine | Admitting: Emergency Medicine

## 2023-08-25 ENCOUNTER — Encounter (HOSPITAL_COMMUNITY): Payer: Self-pay

## 2023-08-25 DIAGNOSIS — R4182 Altered mental status, unspecified: Secondary | ICD-10-CM | POA: Diagnosis not present

## 2023-08-25 DIAGNOSIS — I10 Essential (primary) hypertension: Secondary | ICD-10-CM | POA: Insufficient documentation

## 2023-08-25 DIAGNOSIS — F0390 Unspecified dementia without behavioral disturbance: Secondary | ICD-10-CM | POA: Diagnosis not present

## 2023-08-25 DIAGNOSIS — Z7902 Long term (current) use of antithrombotics/antiplatelets: Secondary | ICD-10-CM | POA: Diagnosis not present

## 2023-08-25 DIAGNOSIS — Z79899 Other long term (current) drug therapy: Secondary | ICD-10-CM | POA: Diagnosis not present

## 2023-08-25 DIAGNOSIS — R55 Syncope and collapse: Secondary | ICD-10-CM | POA: Insufficient documentation

## 2023-08-25 DIAGNOSIS — E119 Type 2 diabetes mellitus without complications: Secondary | ICD-10-CM | POA: Diagnosis not present

## 2023-08-25 LAB — COMPREHENSIVE METABOLIC PANEL
ALT: 9 U/L (ref 0–44)
AST: 19 U/L (ref 15–41)
Albumin: 2.9 g/dL — ABNORMAL LOW (ref 3.5–5.0)
Alkaline Phosphatase: 66 U/L (ref 38–126)
Anion gap: 11 (ref 5–15)
BUN: 30 mg/dL — ABNORMAL HIGH (ref 8–23)
CO2: 26 mmol/L (ref 22–32)
Calcium: 8.8 mg/dL — ABNORMAL LOW (ref 8.9–10.3)
Chloride: 103 mmol/L (ref 98–111)
Creatinine, Ser: 1.66 mg/dL — ABNORMAL HIGH (ref 0.44–1.00)
GFR, Estimated: 33 mL/min — ABNORMAL LOW (ref >60)
Glucose, Bld: 217 mg/dL — ABNORMAL HIGH (ref 70–99)
Potassium: 4.3 mmol/L (ref 3.5–5.1)
Sodium: 140 mmol/L (ref 135–145)
Total Bilirubin: 0.2 mg/dL (ref 0.0–1.2)
Total Protein: 6.4 g/dL — ABNORMAL LOW (ref 6.5–8.1)

## 2023-08-25 LAB — CBC WITH DIFFERENTIAL/PLATELET
Abs Immature Granulocytes: 0.03 10*3/uL (ref 0.00–0.07)
Basophils Absolute: 0 10*3/uL (ref 0.0–0.1)
Basophils Relative: 1 %
Eosinophils Absolute: 0.1 10*3/uL (ref 0.0–0.5)
Eosinophils Relative: 2 %
HCT: 35.4 % — ABNORMAL LOW (ref 36.0–46.0)
Hemoglobin: 11.1 g/dL — ABNORMAL LOW (ref 12.0–15.0)
Immature Granulocytes: 1 %
Lymphocytes Relative: 21 %
Lymphs Abs: 1.2 10*3/uL (ref 0.7–4.0)
MCH: 27.9 pg (ref 26.0–34.0)
MCHC: 31.4 g/dL (ref 30.0–36.0)
MCV: 88.9 fL (ref 80.0–100.0)
Monocytes Absolute: 0.5 10*3/uL (ref 0.1–1.0)
Monocytes Relative: 8 %
Neutro Abs: 3.8 10*3/uL (ref 1.7–7.7)
Neutrophils Relative %: 67 %
Platelets: 248 10*3/uL (ref 150–400)
RBC: 3.98 MIL/uL (ref 3.87–5.11)
RDW: 14.3 % (ref 11.5–15.5)
WBC: 5.7 10*3/uL (ref 4.0–10.5)
nRBC: 0 % (ref 0.0–0.2)

## 2023-08-25 LAB — URINALYSIS, W/ REFLEX TO CULTURE (INFECTION SUSPECTED)
Bacteria, UA: NONE SEEN
Bilirubin Urine: NEGATIVE
Glucose, UA: 50 mg/dL — AB
Hgb urine dipstick: NEGATIVE
Ketones, ur: NEGATIVE mg/dL
Leukocytes,Ua: NEGATIVE
Nitrite: NEGATIVE
Protein, ur: NEGATIVE mg/dL
Specific Gravity, Urine: 1.013 (ref 1.005–1.030)
pH: 5 (ref 5.0–8.0)

## 2023-08-25 LAB — TROPONIN I (HIGH SENSITIVITY)
Troponin I (High Sensitivity): 14 ng/L (ref <18)
Troponin I (High Sensitivity): 23 ng/L — ABNORMAL HIGH (ref <18)
Troponin I (High Sensitivity): 22 ng/L — ABNORMAL HIGH (ref <18)

## 2023-08-25 LAB — I-STAT CHEM 8, ED
BUN: 37 mg/dL — ABNORMAL HIGH (ref 8–23)
Calcium, Ion: 1.16 mmol/L (ref 1.15–1.40)
Chloride: 105 mmol/L (ref 98–111)
Creatinine, Ser: 1.9 mg/dL — ABNORMAL HIGH (ref 0.44–1.00)
Glucose, Bld: 214 mg/dL — ABNORMAL HIGH (ref 70–99)
HCT: 33 % — ABNORMAL LOW (ref 36.0–46.0)
Hemoglobin: 11.2 g/dL — ABNORMAL LOW (ref 12.0–15.0)
Potassium: 4.3 mmol/L (ref 3.5–5.1)
Sodium: 141 mmol/L (ref 135–145)
TCO2: 29 mmol/L (ref 22–32)

## 2023-08-25 MED ORDER — SODIUM CHLORIDE 0.9 % IV BOLUS
500.0000 mL | Freq: Once | INTRAVENOUS | Status: AC
  Administered 2023-08-25: 500 mL via INTRAVENOUS

## 2023-08-25 NOTE — Discharge Instructions (Addendum)
 Follow-up with your primary care doctor.  You need to have your kidney function monitored.  Check your glucose regularly at home.  Return to the emergency room if you have any worsening symptoms.

## 2023-08-25 NOTE — ED Provider Notes (Signed)
 [facility] AT [facility] Provider Note   CSN: [id number] Arrival date & time: 08/25/23  1715     History  Chief Complaint  Patient presents with   Altered Mental Status    ADITHI GAMMON is a 73 y.o. female.  Patient is a 73 year old female with a history of dementia, diabetes, hypertension, hyperlipidemia.  She presents with EMS for altered mental status.  Per EMS report, she was at a car wash in the car with family and they thought she fell asleep but they were able to wake her up.  She was initially less responsive with EMS however when they tried to put an NPA in her airway, she tried to grab it out.  She is much more awake apparently now.  Family is not yet here.       Home Medications Prior to Admission medications   Medication Sig Start Date End Date Taking? Authorizing Provider  amLODipine (NORVASC) 10 MG tablet Take 10 mg by mouth daily.    [provider]  atorvastatin (LIPITOR) 20 MG tablet Take 1 tablet (20 mg total) by mouth daily. 05/17/23   Medina-Vargas, Monina C, NP  clopidogrel (PLAVIX) 75 MG tablet Take 1 tablet (75 mg total) by mouth daily. 10/29/22   Medina-Vargas, Monina C, NP  donepezil (ARICEPT) 5 MG tablet Take 1 tablet (5 mg total) by mouth at bedtime. 06/09/23   Medina-Vargas, Monina C, NP  LORazepam (ATIVAN) 0.5 MG tablet TAKE ONE TABLET BY MOUTH AT BEDTIME AS NEEDED FOR ANXIETY 06/10/23   Medina-Vargas, Monina C, NP      Allergies    Lisinopril and Metformin and related    Review of Systems   Review of Systems  Unable to perform ROS: Dementia    Physical Exam Updated Vital Signs BP (!) 164/109   Pulse 84   Temp 98 F (36.7 C) (Oral)   Resp (!) 24   Ht 5\' 5"  (1.651 m)   Wt 69.2 kg   SpO2 100%   BMI 25.39 kg/m  Physical Exam Constitutional:      Appearance: She is well-developed.  HENT:     Head: Normocephalic and atraumatic.  Eyes:     Pupils: Pupils are equal, round, and reactive to light.   Cardiovascular:     Rate and Rhythm: Normal rate and regular rhythm.     Heart sounds: Normal heart sounds.  Pulmonary:     Effort: Pulmonary effort is normal. No respiratory distress.     Breath sounds: Normal breath sounds. No wheezing or rales.  Chest:     Chest wall: No tenderness.  Abdominal:     General: Bowel sounds are normal.     Palpations: Abdomen is soft.     Tenderness: There is no abdominal tenderness. There is no guarding or rebound.  Musculoskeletal:        General: Normal range of motion.     Cervical back: Normal range of motion and neck supple.     Comments: She has a little bit of pain on range of motion of the hips bilaterally.  Lymphadenopathy:     Cervical: No cervical adenopathy.  Skin:    General: Skin is warm and dry.     Findings: No rash.  Neurological:     Mental Status: She is alert.     Comments: Awake, will tell me her name.  She moves all extremities symmetrically without focal deficits.  No obvious facial drooping.  ED Results / Procedures / Treatments   Labs (all labs ordered are listed, but only abnormal results are displayed) Labs Reviewed  COMPREHENSIVE METABOLIC PANEL - Abnormal; Notable for the following components:      Result Value   Glucose, Bld 217 (*)    BUN 30 (*)    Creatinine, Ser 1.66 (*)    Calcium 8.8 (*)    Total Protein 6.4 (*)    Albumin 2.9 (*)    GFR, Estimated 33 (*)    All other components within normal limits  CBC WITH DIFFERENTIAL/PLATELET - Abnormal; Notable for the following components:   Hemoglobin 11.1 (*)    HCT 35.4 (*)    All other components within normal limits  URINALYSIS, W/ REFLEX TO CULTURE (INFECTION SUSPECTED) - Abnormal; Notable for the following components:   Glucose, UA 50 (*)    All other components within normal limits  I-STAT CHEM 8, ED - Abnormal; Notable for the following components:   BUN 37 (*)    Creatinine, Ser 1.90 (*)    Glucose, Bld 214 (*)    Hemoglobin 11.2 (*)    HCT  33.0 (*)    All other components within normal limits  TROPONIN I (HIGH SENSITIVITY) - Abnormal; Notable for the following components:   Troponin I (High Sensitivity) 23 (*)    All other components within normal limits  TROPONIN I (HIGH SENSITIVITY) - Abnormal; Notable for the following components:   Troponin I (High Sensitivity) 22 (*)    All other components within normal limits  TROPONIN I (HIGH SENSITIVITY)    EKG EKG Interpretation Date/Time:  Thursday August 25 2023 17:27:50 EDT Ventricular Rate:  90 PR Interval:  162 QRS Duration:  87 QT Interval:  348 QTC Calculation: 426 R Axis:   34  Text Interpretation: Sinus rhythm since last tracing no significant change Confirmed by Rolan Bucco [phone]) on 08/25/2023 5:39:15 PM  Radiology CT Head Wo Contrast Result Date: 08/25/2023 CLINICAL DATA:  Mental status change, unknown cause EXAM: CT HEAD WITHOUT CONTRAST TECHNIQUE: Contiguous axial images were obtained from the base of the skull through the vertex without intravenous contrast. RADIATION DOSE REDUCTION: This exam was performed according to the departmental dose-optimization program which includes automated exposure control, adjustment of the mA and/or kV according to patient size and/or use of iterative reconstruction technique. COMPARISON:  Head CT 05/27/2022 FINDINGS: Brain: No evidence of acute infarction, hemorrhage, hydrocephalus, extra-axial collection or mass lesion/mass effect. Unchanged encephalomalacia in the left parietal lobe. Moderate to advanced chronic small vessel ischemic change. Vascular: Atherosclerosis of skullbase vasculature without hyperdense vessel or abnormal calcification. Skull: No fracture or focal lesion. Sinuses/Orbits: Chronic left-sided sphenoid sinusitis. No acute findings. Other: None. IMPRESSION: 1. No acute intracranial abnormality. 2. Unchanged encephalomalacia in the left parietal lobe. Moderate to advanced chronic small vessel ischemic change.  Electronically Signed   By: Narda Rutherford M.D.   On: 08/25/2023 21:18   DG Hip Unilat W or Wo Pelvis 2-3 Views Right Result Date: 08/25/2023 CLINICAL DATA:  Hip pain. EXAM: DG HIP (WITH OR WITHOUT PELVIS) 2-3V RIGHT; DG HIP (WITH OR WITHOUT PELVIS) 2-3V LEFT COMPARISON:  Right hip radiograph 11/12/2022 FINDINGS: Right hip: Advanced chronic arthropathy. Complete joint space loss with osteophytes, subchondral cystic change and sclerosis. Potential avascular necrosis but no subchondral collapse. No acute fracture. Left hip: The joint spaces preserved. No fracture or focal bone abnormality. No erosion or avascular necrosis. Pelvis: No fracture. Pubic symphysis and sacroiliac joints are congruent. Pubic  rami are intact. Tubal ligation clips in the pelvis. IMPRESSION: 1. Advanced chronic arthropathy of the right hip with complete joint space loss. Potential avascular necrosis but no subchondral collapse. 2. Normal left hip and pelvis. Electronically Signed   By: Narda Rutherford M.D.   On: 08/25/2023 20:17   DG Hip Unilat W or Wo Pelvis 2-3 Views Left Result Date: 08/25/2023 CLINICAL DATA:  Hip pain. EXAM: DG HIP (WITH OR WITHOUT PELVIS) 2-3V RIGHT; DG HIP (WITH OR WITHOUT PELVIS) 2-3V LEFT COMPARISON:  Right hip radiograph 11/12/2022 FINDINGS: Right hip: Advanced chronic arthropathy. Complete joint space loss with osteophytes, subchondral cystic change and sclerosis. Potential avascular necrosis but no subchondral collapse. No acute fracture. Left hip: The joint spaces preserved. No fracture or focal bone abnormality. No erosion or avascular necrosis. Pelvis: No fracture. Pubic symphysis and sacroiliac joints are congruent. Pubic rami are intact. Tubal ligation clips in the pelvis. IMPRESSION: 1. Advanced chronic arthropathy of the right hip with complete joint space loss. Potential avascular necrosis but no subchondral collapse. 2. Normal left hip and pelvis. Electronically Signed   By: Narda Rutherford M.D.    On: 08/25/2023 20:17    Procedures Procedures    Medications Ordered in ED Medications  sodium chloride 0.9 % bolus 500 mL (500 mLs Intravenous New Bag/Given 08/25/23 2001)    ED Course/ Medical Decision Making/ A&P                                 Medical Decision Making Amount and/or Complexity of Data Reviewed Labs: ordered. Radiology: ordered.   Patient is a 73 year old who presents after an episode of altered mental status.  I was able to get more information from the family.  They were driving back from the beach and thought the patient was taking a nap.  They tried to wake her up and she would wake up but was not her normal self.  They decided to take her to the hospital and on the way, she became less responsive.  They pulled over and called EMS.  By the time she got to the emergency room, she was back to her normal mental status.  Her workup here has been nonrevealing.  Labs are nonconcerning.  Her creatinine was a little bit bumped from her prior values.  She was given some IV fluids.  Her glucose was mildly elevated but no signs of DKA.  She had a head CT which did not show any acute abnormality.  She seemed to have some tenderness in her hips and had x-rays done which were interpreted by me and confirmed by the radiologist and show no fractures or other bony injuries.  She does have a lot of arthritic changes.  She is not febrile.  She does not have any focal neurologic deficits or other concerns for stroke.  She has not had any arrhythmias here in the ED.  Her EKG did not show any ischemic changes.  Her troponins were minimally bumped but flat.  Her family is at bedside and says she is at her normal mental status.  She was discharged home in good condition.  Her family was advised to have her follow-up with her primary care doctor for recheck.  They can also monitor her creatinine.  Her family was advised to monitor her glucose at home.  Return precautions were given.  Final  Clinical Impression(s) / ED Diagnoses Final diagnoses:  Syncope, unspecified  syncope type    Rx / DC Orders ED Discharge Orders     None         Rolan Bucco, MD 08/25/23 2207

## 2023-08-25 NOTE — ED Notes (Signed)
 Patient transported to X-ray

## 2023-08-25 NOTE — ED Notes (Signed)
 Patient and family verbalize understanding of discharge instructions. Opportunity for questioning and answers were provided. Armband removed by staff, pt discharged from ED. Pt wheeled out to family car, leaving with sons

## 2023-08-25 NOTE — ED Triage Notes (Signed)
 Pt arrived via [facility] from a car. Pt was in a 3 hour long car ride with family from a trip and they were unable to awake pt. They thought pt was sleeping at first. Pt LKW 1430 today. Per EMS, pt initial GCS was 3. EMS attempted to put a NPA in pt and she kept turning her head and trying to grab it. Pt is A&Ox1 to self only. Pt is now awake and talking.

## 2023-09-05 ENCOUNTER — Encounter: Payer: Self-pay | Admitting: Adult Health

## 2023-09-05 ENCOUNTER — Ambulatory Visit (INDEPENDENT_AMBULATORY_CARE_PROVIDER_SITE_OTHER): Payer: Medicare Other | Admitting: Adult Health

## 2023-09-05 VITALS — BP 135/88 | HR 91 | Temp 96.6°F | Resp 18 | Ht 65.0 in | Wt 154.8 lb

## 2023-09-05 DIAGNOSIS — Z8673 Personal history of transient ischemic attack (TIA), and cerebral infarction without residual deficits: Secondary | ICD-10-CM

## 2023-09-05 DIAGNOSIS — F419 Anxiety disorder, unspecified: Secondary | ICD-10-CM

## 2023-09-05 DIAGNOSIS — E1122 Type 2 diabetes mellitus with diabetic chronic kidney disease: Secondary | ICD-10-CM

## 2023-09-05 DIAGNOSIS — E782 Mixed hyperlipidemia: Secondary | ICD-10-CM

## 2023-09-05 DIAGNOSIS — F01C0 Vascular dementia, severe, without behavioral disturbance, psychotic disturbance, mood disturbance, and anxiety: Secondary | ICD-10-CM

## 2023-09-05 DIAGNOSIS — I10 Essential (primary) hypertension: Secondary | ICD-10-CM

## 2023-09-05 DIAGNOSIS — N1832 Chronic kidney disease, stage 3b: Secondary | ICD-10-CM

## 2023-09-05 DIAGNOSIS — Z91148 Patient's other noncompliance with medication regimen for other reason: Secondary | ICD-10-CM

## 2023-09-05 MED ORDER — QUETIAPINE FUMARATE 25 MG PO TABS: 12.5000 mg | ORAL_TABLET | Freq: Every day | ORAL | 3 refills | Status: DC

## 2023-09-05 NOTE — Progress Notes (Signed)
 [facility]  Provider:  Kenard Gower DNP  Code Status:  Full Code  Goals of Care:     09/05/2023    1:17 PM  Advanced Directives  Does Patient Have a Medical Advance Directive? No  Would patient like information on creating a medical advance directive? No - Patient declined     Chief Complaint  Patient presents with   Medical Management of Chronic Issues    3 months follow-up and sign treatment agreement/needs to discuss Mammogram, Dexa scan/Eye exam/Hemoglobin A1C/Fecal DNA (Cologuard)/ Colon cancer screening annual FOBT/Needs to discuss Covid, Shingrix. Orders are pending to be signed by Gillis Santa, NP      Discussed the use of AI scribe software for clinical note transcription with the patient, who gave verbal consent to proceed.  HPI: Patient is a 73 y.o. female seen today for a 14-month follow up of chronic medical issues. She was accompanied by her husband and 2 sons.  She is experiencing significant challenges with medication adherence. Her family is uncertain about whether she is consistently taking her prescribed medications, which include Plavix 75 mg daily for a history of stroke, atorvastatin 20 mg daily for hyperlipidemia, donepezil 5 mg daily for dementia, and lorazepam 0.5 mg at bedtime as needed for anxiety. Despite these prescriptions, there is concern from her family and home health nurse that she is not taking her medications as directed. There is also a concern about her ability to swallow pills, and her family is considering alternative methods such as crushing pills and mixing them with food.  She suffers from severe vascular dementia with behavioral disturbances, including agitation and confusion. Her family reports that she becomes agitated, but magnesium supplements seem to help calm her. She is prescribed donepezil 5 mg daily for dementia and lorazepam 0.5 mg at bedtime as needed for anxiety.  She has a history of stroke, which  necessitates the use of Plavix. Her family confirms that she had a stroke a couple of years ago, and she is supposed to take Plavix 75 mg daily. However, there is uncertainty about her adherence to this regimen.  She has hyperlipidemia, with her last lipid panel in December 2024 showing elevated cholesterol at 253 mg/dL and LDL at 098 mg/dL. She is prescribed atorvastatin 20 mg daily, but her adherence to this medication is also in question.  She has cataracts and was referred for potential surgery, but the procedure has not been scheduled. Her family is awaiting further communication from the referred facility.  She has a history of a syncopal episode, which led to a hospital visit. The cause was not clearly communicated to the family, but it was suggested to be related to her mental state.  She had a urinary tract infection three weeks ago, for which she received treatment. No current symptoms such as pain during urination, blood in urine, chills, or fever.  No issues with bowel movements, and she moves her bowels daily. No swelling in her feet.    Past Medical History:  Diagnosis Date   Acute right ankle pain 05/25/2018   Diabetes mellitus    Hypertension    Pain in gums 02/25/2020   Peroneal tendinitis, right leg 05/30/2018   Pigmented skin lesion of uncertain nature 07/16/2019   Ms. Tesch reports a 3 mo hx of a skin lesion, centrally located below her breasts. She is concerned because her sister developed breast cancer after noticing a spot on her breast. Patient reports no bleeding or itching. She  states it is not getting progressively larger. She states she accidentally scraped it one day which was painful. She has never had anything like this before. She denies a fami   Retinopathy    Sarcoidosis    Screening mammogram, encounter for 06/29/2012   Stroke [facility])     Past Surgical History:  Procedure Laterality Date   EP IMPLANTABLE DEVICE N/A 09/29/2015   Procedure: Loop Recorder  Insertion;  Surgeon: Marinus Maw, MD;  Location: [facility] INVASIVE CV LAB;  Service: Cardiovascular;  Laterality: N/A;   INTRAOCULAR LENS INSERTION     KNEE SURGERY  1999   PET/CT MONITORING/KAPOSIS/SARCOM ([facility] HX)     TEE WITHOUT CARDIOVERSION N/A 09/29/2015   Procedure: TRANSESOPHAGEAL ECHOCARDIOGRAM (TEE);  Surgeon: Laurey Morale, MD;  Location: [facility] ENDOSCOPY;  Service: Cardiovascular;  Laterality: N/A;    Allergies  Allergen Reactions   Lisinopril Cough   Metformin And Related Nausea Only    "severe itching"    Outpatient Encounter Medications as of 09/05/2023  Medication Sig   amLODipine (NORVASC) 10 MG tablet Take 10 mg by mouth daily.   atorvastatin (LIPITOR) 20 MG tablet Take 1 tablet (20 mg total) by mouth daily.   clopidogrel (PLAVIX) 75 MG tablet Take 1 tablet (75 mg total) by mouth daily.   donepezil (ARICEPT) 5 MG tablet Take 1 tablet (5 mg total) by mouth at bedtime.   LORazepam (ATIVAN) 0.5 MG tablet TAKE ONE TABLET BY MOUTH AT BEDTIME AS NEEDED FOR ANXIETY   QUEtiapine (SEROQUEL) 25 MG tablet Take 0.5 tablets (12.5 mg total) by mouth daily.   No facility-administered encounter medications on file as of 09/05/2023.    Review of Systems:    Review of Systems  Constitutional:  Negative for appetite change, chills, fatigue and fever.  HENT:  Negative for congestion, hearing loss, rhinorrhea and sore throat.   Eyes: Negative.   Respiratory:  Negative for cough, shortness of breath and wheezing.   Cardiovascular:  Negative for chest pain, palpitations and leg swelling.  Gastrointestinal:  Negative for abdominal pain, constipation, diarrhea, nausea and vomiting.  Genitourinary:  Negative for dysuria.  Musculoskeletal:  Negative for arthralgias, back pain and myalgias.  Skin:  Negative for color change, rash and wound.  Neurological:  Negative for dizziness, weakness and headaches.  Psychiatric/Behavioral:  Negative for behavioral problems. The patient is not  nervous/anxious.     Health Maintenance  Topic Date Due   COVID-19 Vaccine (1) Never done   Zoster Vaccines- Shingrix (1 of 2) Never done   Fecal DNA (Cologuard)  Never done   COLON CANCER SCREENING ANNUAL FOBT  11/20/2009   MAMMOGRAM  01/07/2011   DEXA SCAN  Never done   OPHTHALMOLOGY EXAM  10/20/2016   Pneumonia Vaccine 58+ Years old (1 of 2 - PCV) 11/12/2023 (Originally 09/15/1956)   INFLUENZA VACCINE  02/06/2024 (Originally 01/13/2023)   FOOT EXAM  11/12/2023   HEMOGLOBIN A1C  12/06/2023   Diabetic kidney evaluation - Urine ACR  02/11/2024   Medicare Annual Wellness (AWV)  04/07/2024   Diabetic kidney evaluation - eGFR measurement  09/04/2024   LIPID PANEL  09/04/2024   Hepatitis C Screening  Completed   HPV VACCINES  Aged Out   DTaP/Tdap/Td  Discontinued    Physical Exam: Vitals:   09/05/23 1157  BP: 135/88  Pulse: 91  Resp: 18  Temp: (!) 96.6 F (35.9 C)  SpO2: 94%  Weight: 154 lb 12.8 oz (70.2 kg)  Height: 5\' 5"  (1.651 m)  Body mass index is 25.76 kg/m. Physical Exam Constitutional:      Appearance: Normal appearance.  HENT:     Head: Normocephalic and atraumatic.     Nose: Nose normal.     Mouth/Throat:     Mouth: Mucous membranes are moist.  Eyes:     Conjunctiva/sclera: Conjunctivae normal.  Cardiovascular:     Rate and Rhythm: Normal rate and regular rhythm.  Pulmonary:     Effort: Pulmonary effort is normal.     Breath sounds: Normal breath sounds.  Abdominal:     General: Bowel sounds are normal.     Palpations: Abdomen is soft.  Musculoskeletal:        General: Normal range of motion.     Cervical back: Normal range of motion.  Skin:    General: Skin is warm and dry.  Neurological:     Mental Status: She is alert.  Psychiatric:        Mood and Affect: Mood normal.        Behavior: Behavior normal.     Labs reviewed: Basic Metabolic Panel: Recent Labs    05/16/23 1359 08/25/23 1753 08/25/23 1807 09/05/23 1346  NA 139 140 141 140   K 4.1 4.3 4.3 4.7  CL 101 103 105 101  CO2 30 26  --  33*  GLUCOSE 119* 217* 214* 105*  BUN 22 30* 37* 17  CREATININE 1.40* 1.66* 1.90* 1.26*  CALCIUM 9.6 8.8*  --  9.9  MG  --   --   --  2.0   Liver Function Tests: Recent Labs    10/29/22 1520 08/25/23 1753 09/05/23 1346  AST 18 19 18   ALT 9 9 6   ALKPHOS  --  66  --   BILITOT 0.5 0.2 0.5  PROT 7.6 6.4* 7.6  ALBUMIN  --  2.9*  --    No results for input(s): "LIPASE", "AMYLASE" in the last 8760 hours. No results for input(s): "AMMONIA" in the last 8760 hours. CBC: Recent Labs    05/16/23 1359 08/25/23 1753 08/25/23 1807 09/05/23 1346  WBC 5.5 5.7  --  4.9  NEUTROABS 3,812 3.8  --  3,347  HGB 13.0 11.1* 11.2* 12.7  HCT 39.7 35.4* 33.0* 39.2  MCV 83.6 88.9  --  85.2  PLT 264 248  --  284   Lipid Panel: Recent Labs    02/11/23 1524 05/16/23 1359 09/05/23 1346  CHOL 278* 253* 266*  HDL 89 89 78  LDLCALC 173* 146* 172*  TRIG 67 75 69  CHOLHDL 3.1 2.8 3.4   Lab Results  Component Value Date   HGBA1C 6.8 (H) 09/05/2023    Procedures since last visit: CT Head Wo Contrast Result Date: 08/25/2023 CLINICAL DATA:  Mental status change, unknown cause EXAM: CT HEAD WITHOUT CONTRAST TECHNIQUE: Contiguous axial images were obtained from the base of the skull through the vertex without intravenous contrast. RADIATION DOSE REDUCTION: This exam was performed according to the departmental dose-optimization program which includes automated exposure control, adjustment of the mA and/or kV according to patient size and/or use of iterative reconstruction technique. COMPARISON:  Head CT 05/27/2022 FINDINGS: Brain: No evidence of acute infarction, hemorrhage, hydrocephalus, extra-axial collection or mass lesion/mass effect. Unchanged encephalomalacia in the left parietal lobe. Moderate to advanced chronic small vessel ischemic change. Vascular: Atherosclerosis of skullbase vasculature without hyperdense vessel or abnormal  calcification. Skull: No fracture or focal lesion. Sinuses/Orbits: Chronic left-sided sphenoid sinusitis. No acute findings. Other: None. IMPRESSION: 1.  No acute intracranial abnormality. 2. Unchanged encephalomalacia in the left parietal lobe. Moderate to advanced chronic small vessel ischemic change. Electronically Signed   By: Narda Rutherford M.D.   On: 08/25/2023 21:18   DG Hip Unilat W or Wo Pelvis 2-3 Views Right Result Date: 08/25/2023 CLINICAL DATA:  Hip pain. EXAM: DG HIP (WITH OR WITHOUT PELVIS) 2-3V RIGHT; DG HIP (WITH OR WITHOUT PELVIS) 2-3V LEFT COMPARISON:  Right hip radiograph 11/12/2022 FINDINGS: Right hip: Advanced chronic arthropathy. Complete joint space loss with osteophytes, subchondral cystic change and sclerosis. Potential avascular necrosis but no subchondral collapse. No acute fracture. Left hip: The joint spaces preserved. No fracture or focal bone abnormality. No erosion or avascular necrosis. Pelvis: No fracture. Pubic symphysis and sacroiliac joints are congruent. Pubic rami are intact. Tubal ligation clips in the pelvis. IMPRESSION: 1. Advanced chronic arthropathy of the right hip with complete joint space loss. Potential avascular necrosis but no subchondral collapse. 2. Normal left hip and pelvis. Electronically Signed   By: Narda Rutherford M.D.   On: 08/25/2023 20:17   DG Hip Unilat W or Wo Pelvis 2-3 Views Left Result Date: 08/25/2023 CLINICAL DATA:  Hip pain. EXAM: DG HIP (WITH OR WITHOUT PELVIS) 2-3V RIGHT; DG HIP (WITH OR WITHOUT PELVIS) 2-3V LEFT COMPARISON:  Right hip radiograph 11/12/2022 FINDINGS: Right hip: Advanced chronic arthropathy. Complete joint space loss with osteophytes, subchondral cystic change and sclerosis. Potential avascular necrosis but no subchondral collapse. No acute fracture. Left hip: The joint spaces preserved. No fracture or focal bone abnormality. No erosion or avascular necrosis. Pelvis: No fracture. Pubic symphysis and sacroiliac joints are  congruent. Pubic rami are intact. Tubal ligation clips in the pelvis. IMPRESSION: 1. Advanced chronic arthropathy of the right hip with complete joint space loss. Potential avascular necrosis but no subchondral collapse. 2. Normal left hip and pelvis. Electronically Signed   By: Narda Rutherford M.D.   On: 08/25/2023 20:17    Assessment/Plan  1. Nonadherence to medication (Primary) -  -  Significant non-adherence to Plavix, atorvastatin, donepezil, and lorazepam. Emphasized Plavix importance for stroke prevention. - Educated family on medication adherence. - Suggested crushing medications and mixing with applesauce or pudding. - Consider Seroquel 12.5 mg daily for dementia-related behavioral disturbances. - Magnesium - Vitamin B12  2. Severe vascular dementia without behavioral disturbance, psychotic disturbance, mood disturbance, or anxiety (HCC) -  Severe dementia with agitation and confusion. Seroquel trial suggested for symptom management. - Start Seroquel 12.5 mg daily, monitor for sedation. - Continue donepezil 5 mg daily. - Monitor behavioral symptoms, adjust treatment as necessary. - Check magnesium levels today. - QUEtiapine (SEROQUEL) 25 MG tablet; Take 0.5 tablets (12.5 mg total) by mouth daily.  Dispense: 30 tablet; Refill: 3  3. History of stroke -  Non-adherence to Plavix is a concern for recurrent stroke risk. - Ensure adherence to Plavix 75 mg daily. - Educated family on Plavix importance for stroke prevention.  - CBC with Differential/Platelets - Complete Metabolic Panel with eGFR  4. Mixed hyperlipidemia -  Mixed hyperlipidemia with non-adherence to atorvastatin. - Ensure adherence to atorvastatin 20 mg daily. - Recheck lipid panel today. - Lipid panel  5. Type 2 diabetes mellitus with stage 3b chronic kidney disease, without long-term current use of insulin (HCC) -  A1c of 7.9, not on medication. Monitoring blood glucose closely. - Repeat A1c today. - Monitor  blood glucose levels, consider treatment if A1c remains elevated. - Hemoglobin A1c  6. Anxiety -  Experiences anxiety, lorazepam prescribed.  Magnesium supplements reportedly help. - Continue lorazepam 0.5 mg QHS PRN. - Monitor for effectiveness and side effects. - Check magnesium levels today.  7. Essential hypertension, benign -  BP 135/88, stable -  continue Amlodipine 10 mg daily   General Health Maintenance Due for mammogram and bone density test, not completed. - Encourage scheduling of mammogram and bone density test. - Discuss importance of screenings with family.  Follow-up Regular follow-up visits crucial for monitoring health and medication adherence. - Schedule follow-up appointment in one month. - Ensure blood work is completed today, including CBC, CMP, A1c, lipid panel, magnesium, and vitamin B12.     Labs/tests ordered:  CBC, CMP, A1c, lipid panel, magnesium, and vitamin B12.    Return in about 1 month (around 10/06/2023).  Kenard Gower, NP

## 2023-09-06 LAB — CBC WITH DIFFERENTIAL/PLATELET
Absolute Lymphocytes: 1068 {cells}/uL (ref 850–3900)
Absolute Monocytes: 368 {cells}/uL (ref 200–950)
Basophils Absolute: 39 {cells}/uL (ref 0–200)
Basophils Relative: 0.8 %
Eosinophils Absolute: 78 {cells}/uL (ref 15–500)
Eosinophils Relative: 1.6 %
HCT: 39.2 % (ref 35.0–45.0)
Hemoglobin: 12.7 g/dL (ref 11.7–15.5)
MCH: 27.6 pg (ref 27.0–33.0)
MCHC: 32.4 g/dL (ref 32.0–36.0)
MCV: 85.2 fL (ref 80.0–100.0)
MPV: 10.9 fL (ref 7.5–12.5)
Monocytes Relative: 7.5 %
Neutro Abs: 3347 {cells}/uL (ref 1500–7800)
Neutrophils Relative %: 68.3 %
Platelets: 284 10*3/uL (ref 140–400)
RBC: 4.6 10*6/uL (ref 3.80–5.10)
RDW: 13.5 % (ref 11.0–15.0)
Total Lymphocyte: 21.8 %
WBC: 4.9 10*3/uL (ref 3.8–10.8)

## 2023-09-06 LAB — LIPID PANEL
Cholesterol: 266 mg/dL — ABNORMAL HIGH (ref <200)
HDL: 78 mg/dL (ref >=50)
LDL Cholesterol (Calc): 172 mg/dL — ABNORMAL HIGH
Non-HDL Cholesterol (Calc): 188 mg/dL — ABNORMAL HIGH (ref <130)
Total CHOL/HDL Ratio: 3.4 (calc) (ref <5.0)
Triglycerides: 69 mg/dL (ref <150)

## 2023-09-06 LAB — COMPLETE METABOLIC PANEL WITH GFR
AG Ratio: 1.1 (calc) (ref 1.0–2.5)
ALT: 6 U/L (ref 6–29)
AST: 18 U/L (ref 10–35)
Albumin: 4 g/dL (ref 3.6–5.1)
Alkaline phosphatase (APISO): 97 U/L (ref 37–153)
BUN/Creatinine Ratio: 13 (calc) (ref 6–22)
BUN: 17 mg/dL (ref 7–25)
CO2: 33 mmol/L — ABNORMAL HIGH (ref 20–32)
Calcium: 9.9 mg/dL (ref 8.6–10.4)
Chloride: 101 mmol/L (ref 98–110)
Creat: 1.26 mg/dL — ABNORMAL HIGH (ref 0.60–1.00)
Globulin: 3.6 g/dL (ref 1.9–3.7)
Glucose, Bld: 105 mg/dL — ABNORMAL HIGH (ref 65–99)
Potassium: 4.7 mmol/L (ref 3.5–5.3)
Sodium: 140 mmol/L (ref 135–146)
Total Bilirubin: 0.5 mg/dL (ref 0.2–1.2)
Total Protein: 7.6 g/dL (ref 6.1–8.1)

## 2023-09-06 LAB — VITAMIN B12: Vitamin B-12: 424 pg/mL (ref 200–1100)

## 2023-09-06 LAB — MAGNESIUM: Magnesium: 2 mg/dL (ref 1.5–2.5)

## 2023-09-06 LAB — HEMOGLOBIN A1C
Hgb A1c MFr Bld: 6.8 %{Hb} — ABNORMAL HIGH (ref <5.7)
Mean Plasma Glucose: 148 mg/dL
eAG (mmol/L): 8.2 mmol/L

## 2023-09-11 MED ORDER — ATORVASTATIN CALCIUM 40 MG PO TABS: 40.0000 mg | ORAL_TABLET | Freq: Every day | ORAL | 3 refills | Status: DC

## 2023-09-11 NOTE — Progress Notes (Signed)
-   Cholesterol 266, up from 253 (3 months ago) LDL 172, up from 172 (3 months ago), will increase atorvastatin from 20 mg daily to 40 mg daily, sent prescription to pharmacy -  Electrolytes and liver enzymes normal-   -  No anemia -Vitamin B12 normal -Magnesium normal -   A1c 6.8, up from 6.7, no new orders

## 2023-09-30 ENCOUNTER — Emergency Department (HOSPITAL_COMMUNITY)

## 2023-09-30 ENCOUNTER — Emergency Department (HOSPITAL_COMMUNITY)
Admission: EM | Admit: 2023-09-30 | Discharge: 2023-09-30 | Disposition: A | Discharge status: Home / Self Care | Attending: Emergency Medicine | Admitting: Emergency Medicine

## 2023-09-30 DIAGNOSIS — F0390 Unspecified dementia without behavioral disturbance: Secondary | ICD-10-CM | POA: Diagnosis not present

## 2023-09-30 DIAGNOSIS — R4182 Altered mental status, unspecified: Secondary | ICD-10-CM | POA: Insufficient documentation

## 2023-09-30 LAB — CBC WITH DIFFERENTIAL/PLATELET
Abs Immature Granulocytes: 0.01 10*3/uL (ref 0.00–0.07)
Basophils Absolute: 0.1 10*3/uL (ref 0.0–0.1)
Basophils Relative: 1 %
Eosinophils Absolute: 0.1 10*3/uL (ref 0.0–0.5)
Eosinophils Relative: 2 %
HCT: 36.6 % (ref 36.0–46.0)
Hemoglobin: 11.6 g/dL — ABNORMAL LOW (ref 12.0–15.0)
Immature Granulocytes: 0 %
Lymphocytes Relative: 21 %
Lymphs Abs: 1 10*3/uL (ref 0.7–4.0)
MCH: 27.8 pg (ref 26.0–34.0)
MCHC: 31.7 g/dL (ref 30.0–36.0)
MCV: 87.8 fL (ref 80.0–100.0)
Monocytes Absolute: 0.4 10*3/uL (ref 0.1–1.0)
Monocytes Relative: 8 %
Neutro Abs: 3.4 10*3/uL (ref 1.7–7.7)
Neutrophils Relative %: 68 %
Platelets: 255 10*3/uL (ref 150–400)
RBC: 4.17 MIL/uL (ref 3.87–5.11)
RDW: 13.9 % (ref 11.5–15.5)
WBC: 5 10*3/uL (ref 4.0–10.5)
nRBC: 0 % (ref 0.0–0.2)

## 2023-09-30 LAB — COMPREHENSIVE METABOLIC PANEL WITH GFR
ALT: 12 U/L (ref 0–44)
AST: 23 U/L (ref 15–41)
Albumin: 3.3 g/dL — ABNORMAL LOW (ref 3.5–5.0)
Alkaline Phosphatase: 82 U/L (ref 38–126)
Anion gap: 9 (ref 5–15)
BUN: 22 mg/dL (ref 8–23)
CO2: 28 mmol/L (ref 22–32)
Calcium: 9.4 mg/dL (ref 8.9–10.3)
Chloride: 105 mmol/L (ref 98–111)
Creatinine, Ser: 1.55 mg/dL — ABNORMAL HIGH (ref 0.44–1.00)
GFR, Estimated: 35 mL/min — ABNORMAL LOW (ref >60)
Glucose, Bld: 195 mg/dL — ABNORMAL HIGH (ref 70–99)
Potassium: 3.8 mmol/L (ref 3.5–5.1)
Sodium: 142 mmol/L (ref 135–145)
Total Bilirubin: 0.5 mg/dL (ref 0.0–1.2)
Total Protein: 7 g/dL (ref 6.5–8.1)

## 2023-09-30 MED ORDER — HALOPERIDOL LACTATE 5 MG/ML IJ SOLN
5.0000 mg | Freq: Once | INTRAMUSCULAR | Status: AC
  Administered 2023-09-30: 5 mg via INTRAMUSCULAR
  Filled 2023-09-30: qty 1

## 2023-09-30 NOTE — ED Notes (Signed)
 Pt GCS 14, A&O to self only, Pt calm and cooperative at this time, Respirations even and unlabored, pt on cardiac monitor, EKG done given to MD Countryman, 20g IV placed Right AC, Labs drawn and sent to lab, Sitter at bedside, Husband at bedside. Bed locked in lowest possible position, side rails up.

## 2023-09-30 NOTE — ED Notes (Signed)
 Paged/called Case [facility] regarding TOC consult [phone]

## 2023-09-30 NOTE — ED Triage Notes (Signed)
 BIB EMS for altered mental status patient activated her life alert activation, Pt altered mental status, word salad present per ems report, unsure of baseline mental status. pt combative with EMS given 5mg  Midazolam  IM PTA, hx of previous CVA. A&O to person only and GCS 14 upon initial encounter.

## 2023-09-30 NOTE — ED Provider Notes (Signed)
 [place] EMERGENCY DEPARTMENT AT [facility] Provider Note   CSN: [id number] Arrival date & time: 09/30/23  1907     History Chief Complaint  Patient presents with   Altered Mental Status    HPI Wendy [name] is a 73 y.o. female presenting for reported altered mental status. Patient has dementia and she is here with her husband who also has dementia. They are normally cared for by their son.  This family member is not answering the phone at this time.  Cannot get any collateral history and cannot get any direct history from either the patient or the family as they have severe cognitive decline. They deny any fevers chills nausea vomit syncope shortness of breath.  Patient's recorded medical, surgical, social, medication list and allergies were reviewed in the Snapshot window as part of the initial history.   Review of Systems   Review of Systems  Unable to perform ROS: Dementia    Physical Exam Updated Vital Signs BP 136/81 (BP Location: Left Arm)   Pulse 82   Temp 98.2 F (36.8 C) (Oral)   Resp 17   SpO2 99%  Physical Exam Constitutional:      General: She is not in acute distress.    Appearance: She is not ill-appearing or toxic-appearing.  HENT:     Head: Normocephalic and atraumatic.  Eyes:     Extraocular Movements: Extraocular movements intact.     Pupils: Pupils are equal, round, and reactive to light.  Cardiovascular:     Rate and Rhythm: Normal rate.  Pulmonary:     Effort: No respiratory distress.  Abdominal:     General: Abdomen is flat.  Musculoskeletal:        General: No swelling, deformity or signs of injury.     Cervical back: Normal range of motion. No rigidity.  Skin:    General: Skin is warm and dry.  Neurological:     General: No focal deficit present.     Mental Status: She is alert and oriented to person, place, and time.  Psychiatric:        Mood and Affect: Mood normal.      ED Course/ Medical Decision Making/ A&P     Procedures Procedures   Medications Ordered in ED Medications  haloperidol  lactate (HALDOL ) injection 5 mg (5 mg Intramuscular Given 09/30/23 1942)   Medical Decision Making:   Wendy [name] is a 73 y.o. female who presented to the ED today with altered mental status detailed above.    Handoff received from EMS.  Additional history discussed with patient's family/caregivers.  Patient placed on continuous vitals and telemetry monitoring while in ED which was reviewed periodically.  Complete initial physical exam performed, notably the patient  was HDS in NAD GCS approximately 13 as her speech is completely nonsensical.  She is in no acute distress has no focal findings..    Reviewed and confirmed nursing documentation for past medical history, family history, social history.    Initial Assessment:   With the patient's presentation of altered mental status, most likely diagnosis is delerium 2/2 infectious etiology (UTI/CAP/URI) vs metabolic abnormality (Na/K/Mg/Ca) vs nonspecific etiology. Other diagnoses were considered including (but not limited to) CVA, ICH, intracranial mass, critical dehydration, heptatic dysfunction, uremia, hypercarbia, intoxication, endrocrine abnormality, toxidrome. These are considered less likely due to history of present illness and physical exam findings.   This is most consistent with an acute life/limb threatening illness complicated by underlying chronic conditions. Will  attempt getting collateral history with a social work consult as reevaluate below  Initial Plan:  CTH to evaluate for intracranial etiology of patient's symptoms  Screening labs including CBC and Metabolic panel to evaluate for infectious or metabolic etiology of disease.  Urinalysis with reflex culture ordered to evaluate for UTI or relevant urologic/nephrologic pathology.  CXR to evaluate for structural/infectious intrathoracic pathology.  EKG to evaluate for cardiac  pathology Objective evaluation as below reviewed   Initial Study Results:   Laboratory  All laboratory results reviewed without evidence of clinically relevant pathology.    EKG EKG was reviewed independently. Rate, rhythm, axis, intervals all examined and without medically relevant abnormality. ST segments without concerns for elevations.    Radiology:  All images reviewed independently. Agree with radiology report at this time.   DG Chest Portable 1 View Result Date: 09/30/2023 CLINICAL DATA:  Shortness of breath, altered mental status. EXAM: PORTABLE CHEST 1 VIEW COMPARISON:  PA and lateral chest 01/19/2021. FINDINGS: There are artifacts from overlying clothing. There is a metal fingernail clipper superimposing over the crest of the left diaphragm. The lungs are low in volume and demonstrate fibrotic interstitial coarsening. No focal pneumonic infiltrate is seen, with limited view of the bases. There is no substantial pleural effusion. The cardiomediastinal silhouette and vasculature are normal. Thoracic spondylosis. IMPRESSION: Low lung volumes with fibrotic interstitial coarsening. No evidence of acute chest disease. Limited view of the bases. Electronically Signed   By: Denman Fischer M.D.   On: 09/30/2023 22:31   CT HEAD WO CONTRAST ( ) Result Date: 09/30/2023 CLINICAL DATA:  Head trauma, minor (Age >= 65y) EXAM: CT HEAD WITHOUT CONTRAST TECHNIQUE: Contiguous axial images were obtained from the base of the skull through the vertex without intravenous contrast. RADIATION DOSE REDUCTION: This exam was performed according to the departmental dose-optimization program which includes automated exposure control, adjustment of the mA and/or kV according to patient size and/or use of iterative reconstruction technique. COMPARISON:  CT head March 13, 25. FINDINGS: Brain: Similar left parietal encephalomalacia and chronic microvascular ischemic disease. No evidence of acute large vascular territory  infarct, acute hemorrhage, mass lesion, midline shift or hydrocephalus. Vascular: No hyperdense vessel identified. Skull: No acute fracture. Sinuses/Orbits: Clear sinuses.  No acute orbital findings. Other: No mastoid effusions. IMPRESSION: 1. No evidence of acute intracranial abnormality. 2. Similar left parietal encephalomalacia and chronic microvascular ischemic disease. Electronically Signed   By: Stevenson Elbe M.D.   On: 09/30/2023 22:09      Consults: Case discussed with social work.   Final Assessment and Plan:   Ultimately, patient's medical evaluation showed no focal pathology. Social work consult was able to find family and get collateral history.  Per family report:  they had to be left home alone today as the son was taking the other brother to the emergency room at an external location. Per report from the son, while the parents were home alone, they can sometimes get scared and called EMS.  However he reported that mom and dad are currently at their baseline mental status.  No acute medical problems detected on today's evaluation patient stable for outpatient care management.  I am worried about patient's safety in the outpatient setting.  I consulted transition of care team who recommended home health evaluation and they have educated the family member on importance of continuing to take full-time care of the patient until home health is involved.  Clinical Impression:  1. Altered mental status, unspecified altered mental status type  Discharge   Final Clinical Impression(s) / ED Diagnoses Final diagnoses:  Altered mental status, unspecified altered mental status type    Rx / DC Orders ED Discharge Orders     None         Onetha Bile, MD 09/30/23 2249

## 2023-09-30 NOTE — TOC CM/SW Note (Signed)
 SW was successful in getting in touch with patient's son Wendy [name] [phone]) to update him regarding his mother (the patient) is here at hospital presenting with AMS, and medical staff have been trying to get in touch with him and his brother Wendy [name]. Wendy [name] stated he was not aware of his mother was in the hospital because he had to take his brother to the hospital in [facility]. Wendy [name] is on his way to hospital.   .Wendy [name], MSW, LCSWA Transition of Care  Clinical Social Worker (ED 3-11 Mon-Fri)  [phone]

## 2023-10-01 ENCOUNTER — Other Ambulatory Visit: Payer: Self-pay | Admitting: Adult Health

## 2023-10-01 DIAGNOSIS — Z113 Encounter for screening for infections with a predominantly sexual mode of transmission: Secondary | ICD-10-CM

## 2023-10-06 ENCOUNTER — Ambulatory Visit: Admitting: Adult Health

## 2023-10-10 ENCOUNTER — Ambulatory Visit: Payer: Self-pay

## 2023-10-10 ENCOUNTER — Other Ambulatory Visit: Payer: Self-pay | Admitting: Adult Health

## 2023-10-10 DIAGNOSIS — F419 Anxiety disorder, unspecified: Secondary | ICD-10-CM

## 2023-10-10 DIAGNOSIS — I10 Essential (primary) hypertension: Secondary | ICD-10-CM

## 2023-10-10 NOTE — Telephone Encounter (Signed)
 Copied from CRM [id number]. Topic: Clinical - Medication Refill >> Oct 10, 2023  3:09 PM Danelle Dunning F wrote: Most Recent Primary Care Visit:  Provider: Duncan Gibson  Department: [facility]  Visit Type: OFFICE VISIT  Date: 09/05/2023   Patient's sons, are calling in their mother's prescription to ensure she has all of her medications. Agent is listing all those that have not been filled recently, please disregard any that the patient is no longer taking.  Medication:  1.  amLODipine (NORVASC) 10 MG tablet 2. LORazepam  (ATIVAN ) 0.5 MG tablet  Has the patient contacted their pharmacy? No  Is this the correct pharmacy for this prescription? Yes  This is the patient's preferred pharmacy:    [id number] [place], [place] - [address] [place] [place]. [place] [address] Phone: [phone] Fax: [phone]   Has the prescription been filled recently? No  Is the patient out of the medication? No  Has the patient been seen for an appointment in the last year OR does the patient have an upcoming appointment? Yes  Can we respond through MyChart? Yes  Agent: Please be advised that Rx refills may take up to 3 business days. We ask that you follow-up with your pharmacy.

## 2023-10-10 NOTE — Telephone Encounter (Signed)
 Copied from CRM [id number]. Topic: Clinical - Medical Advice >> Oct 10, 2023  2:52 PM Danelle Dunning F wrote: Reason for CRM:   Patient's sons, Carolyn Cisco & Larinda Plover called in to schedule the patient for an appointment to discuss the need for a Medicare Pre-Claim Review. The patient was scheduled for a in-office hospital follow up visit due to recent ED visit. The main concern for getting the patient home health care is to assist with possible dementia / altered mental state.   Please call either son with any questions or concerns prior to the patient's appointment tomorrow. Either son would like to receive a call back to inform them on any thing additional they may need to do to prevent any delay on getting the Medicare Pre- Claim Review submitted.  Larinda Plover : [phone]  Jamie: [phone]

## 2023-10-11 ENCOUNTER — Telehealth: Payer: Self-pay

## 2023-10-11 ENCOUNTER — Ambulatory Visit (INDEPENDENT_AMBULATORY_CARE_PROVIDER_SITE_OTHER): Admitting: Adult Health

## 2023-10-11 ENCOUNTER — Encounter: Payer: Self-pay | Admitting: Adult Health

## 2023-10-11 VITALS — BP 136/80 | HR 95 | Temp 98.2°F | Ht 65.0 in | Wt 150.0 lb

## 2023-10-11 DIAGNOSIS — F01C18 Vascular dementia, severe, with other behavioral disturbance: Secondary | ICD-10-CM

## 2023-10-11 DIAGNOSIS — N1832 Chronic kidney disease, stage 3b: Secondary | ICD-10-CM

## 2023-10-11 DIAGNOSIS — I129 Hypertensive chronic kidney disease with stage 1 through stage 4 chronic kidney disease, or unspecified chronic kidney disease: Secondary | ICD-10-CM | POA: Diagnosis not present

## 2023-10-11 DIAGNOSIS — E1122 Type 2 diabetes mellitus with diabetic chronic kidney disease: Secondary | ICD-10-CM | POA: Diagnosis not present

## 2023-10-11 DIAGNOSIS — N183 Chronic kidney disease, stage 3 unspecified: Secondary | ICD-10-CM

## 2023-10-11 DIAGNOSIS — F419 Anxiety disorder, unspecified: Secondary | ICD-10-CM

## 2023-10-11 DIAGNOSIS — E782 Mixed hyperlipidemia: Secondary | ICD-10-CM

## 2023-10-11 DIAGNOSIS — Z8673 Personal history of transient ischemic attack (TIA), and cerebral infarction without residual deficits: Secondary | ICD-10-CM | POA: Diagnosis not present

## 2023-10-11 MED ORDER — LORAZEPAM 0.5 MG PO TABS: ORAL_TABLET | ORAL | 0 refills | Status: DC

## 2023-10-11 MED ORDER — AMLODIPINE BESYLATE 10 MG PO TABS: 10.0000 mg | ORAL_TABLET | Freq: Every day | ORAL | 2 refills | Status: DC

## 2023-10-11 MED ORDER — QUETIAPINE FUMARATE 25 MG PO TABS: 12.5000 mg | ORAL_TABLET | Freq: Every day | ORAL | 1 refills | Status: DC

## 2023-10-11 NOTE — Telephone Encounter (Signed)
 Was seen in the clinic today, 10/11/23. Discussed meds and home health referral with sons. FYI.

## 2023-10-11 NOTE — Telephone Encounter (Signed)
 Copied from CRM [id number]. Topic: Referral - Question >> Oct 11, 2023  1:53 PM Adrianna P wrote: Reason for CRM: Patient wants to know the name of the home health place they were referred to. Please call [phone]

## 2023-10-11 NOTE — Telephone Encounter (Signed)
Noted.  Thank you for the feedback.

## 2023-10-11 NOTE — Telephone Encounter (Signed)
 Wendy [name]  You17 minutes ago (3:19 PM)   CB I have sent the home health referral/orders to [facility], as they have an existing relationship from previous services. Pt was discharged from [facility] on 07/25/23 under other orders. Faxed new order/referral to [facility] and returned patient phone call inquiry- left a message advising where referral has been sent and provided my direct number [phone], to return my call if there is someplace different they would prefer.

## 2023-10-11 NOTE — Progress Notes (Signed)
 [facility] clinic  Provider:  Inge Mangle DNP  Code Status:  Full Co  Goals of Care:     09/05/2023    1:17 PM  Advanced Directives  Does Patient Have a Medical Advance Directive? No  Would patient like information on creating a medical advance directive? No - Patient declined     Chief Complaint  Patient presents with   Hospitalization Follow-up    Hospital up , she pass out and she would not wake up , still having issues memory demita has gotten worse  can't do daily active    Discussed the use of AI scribe software for clinical note transcription with the patient, who gave verbal consent to proceed  HPI: Patient is a 73 y.o. female seen today for an acute visit for hospital follow up. She was accompanied by her husband and 2 sons..  She was recently seen in the ED, 09/30/23, due to an episode of unresponsiveness, which is noted as a possible occurrence in dementia patients. She was unresponsive for a period, leading to an ambulance being called. She eventually regained consciousness. Since the hospitalization, there has been a noticeable decline in her memory and cognitive abilities, with worsening dementia symptoms. She is now largely unable to perform her daily activities independently.  Her current medications include lorazepam  0.5 mg as needed for anxiety, atorvastatin  40 mg daily for hyperlipidemia,  Seroquel  12.5 mg daily for agitation, amlodipine 10 mg daily for hypertension, Plavix  75 mg daily for history of CVA, and Aricept  5 mg daily for dementia.  There have been no recent falls reported. She does not smoke or drink alcohol .   VITALS: T- 98.2, P- 95, BP- 136/80, SaO2- 98%   Past Medical History:  Diagnosis Date   Acute right ankle pain 05/25/2018   Diabetes mellitus    Hypertension    Pain in gums 02/25/2020   Peroneal tendinitis, right leg 05/30/2018   Pigmented skin lesion of uncertain nature 07/16/2019   Ms. Lappin reports a 3 mo hx of a skin lesion,  centrally located below her breasts. She is concerned because her sister developed breast cancer after noticing a spot on her breast. Patient reports no bleeding or itching. She states it is not getting progressively larger. She states she accidentally scraped it one day which was painful. She has never had anything like this before. She denies a fami   Retinopathy    Sarcoidosis    Screening mammogram, encounter for 06/29/2012   Stroke [facility])     Past Surgical History:  Procedure Laterality Date   EP IMPLANTABLE DEVICE N/A 09/29/2015   Procedure: Loop Recorder Insertion;  Surgeon: Tammie Fall, MD;  Location: [facility] INVASIVE CV LAB;  Service: Cardiovascular;  Laterality: N/A;   INTRAOCULAR LENS INSERTION     KNEE SURGERY  1999   PET/CT MONITORING/KAPOSIS/SARCOM ([facility] HX)     TEE WITHOUT CARDIOVERSION N/A 09/29/2015   Procedure: TRANSESOPHAGEAL ECHOCARDIOGRAM (TEE);  Surgeon: Darlis Eisenmenger, MD;  Location[facility] ENDOSCOPY;  Service: Cardiovascular;  Laterality: N/A;    Allergies  Allergen Reactions   Lisinopril  Cough   Metformin  And Related Nausea Only    "severe itching"    Outpatient Encounter Medications as of 10/11/2023  Medication Sig   amLODipine (NORVASC) 10 MG tablet Take 10 mg by mouth daily.   atorvastatin  (LIPITOR) 40 MG tablet Take 1 tablet (40 mg total) by mouth daily.   clopidogrel  (PLAVIX ) 75 MG tablet TAKE ONE TABLET BY MOUTH ONE TIME DAILY  donepezil  (ARICEPT ) 5 MG tablet Take 1 tablet (5 mg total) by mouth at bedtime.   [DISCONTINUED] LORazepam  (ATIVAN ) 0.5 MG tablet TAKE ONE TABLET BY MOUTH AT BEDTIME AS NEEDED FOR ANXIETY   LORazepam  (ATIVAN ) 0.5 MG tablet TAKE ONE TABLET BY MOUTH AT BEDTIME AS NEEDED FOR ANXIETY   QUEtiapine  (SEROQUEL ) 25 MG tablet Take 0.5 tablets (12.5 mg total) by mouth daily.   [DISCONTINUED] QUEtiapine  (SEROQUEL ) 25 MG tablet Take 0.5 tablets (12.5 mg total) by mouth daily. (Patient not taking: Reported on 10/11/2023)   No facility-administered  encounter medications on file as of 10/11/2023.    Review of Systems:  Review of Systems  Unable to obtain due to dementia.  Health Maintenance  Topic Date Due   COVID-19 Vaccine (1) Never done   Zoster Vaccines- Shingrix (1 of 2) Never done   Fecal DNA (Cologuard)  Never done   COLON CANCER SCREENING ANNUAL FOBT  11/20/2009   MAMMOGRAM  01/07/2011   DEXA SCAN  Never done   OPHTHALMOLOGY EXAM  10/20/2016   Pneumonia Vaccine 92+ Years old (1 of 2 - PCV) 11/12/2023 (Originally 09/15/1969)   FOOT EXAM  11/12/2023   HEMOGLOBIN A1C  12/06/2023   INFLUENZA VACCINE  01/13/2024   Diabetic kidney evaluation - Urine ACR  02/11/2024   Medicare Annual Wellness (AWV)  04/07/2024   LIPID PANEL  09/04/2024   Diabetic kidney evaluation - eGFR measurement  09/29/2024   Hepatitis C Screening  Completed   HPV VACCINES  Aged Out   Meningococcal B Vaccine  Aged Out   DTaP/Tdap/Td  Discontinued    Physical Exam: Vitals:   10/11/23 1126  BP: 136/80  Pulse: 95  Temp: 98.2 F (36.8 C)  TempSrc: Temporal  SpO2: 98%  Weight: 150 lb (68 kg)  Height: 5\' 5"  (1.651 m)   Body mass index is 24.96 kg/m. Physical Exam Constitutional:      Appearance: Normal appearance.  HENT:     Head: Normocephalic and atraumatic.     Nose: Nose normal.     Mouth/Throat:     Mouth: Mucous membranes are moist.  Eyes:     Conjunctiva/sclera: Conjunctivae normal.  Cardiovascular:     Rate and Rhythm: Normal rate and regular rhythm.  Pulmonary:     Effort: Pulmonary effort is normal.     Breath sounds: Normal breath sounds.  Abdominal:     General: Bowel sounds are normal.     Palpations: Abdomen is soft.  Musculoskeletal:        General: Normal range of motion.     Cervical back: Normal range of motion.     Comments: Uses cane when walking  Skin:    General: Skin is warm and dry.  Neurological:     Mental Status: She is alert.  Psychiatric:        Mood and Affect: Mood normal.        Behavior: Behavior  normal.        Thought Content: Thought content normal.        Judgment: Judgment normal.    Labs reviewed: Basic Metabolic Panel: Recent Labs    08/25/23 1753 08/25/23 1807 09/05/23 1346 09/30/23 2010  NA 140 141 140 142  K 4.3 4.3 4.7 3.8  CL 103 105 101 105  CO2 26  --  33* 28  GLUCOSE 217* 214* 105* 195*  BUN 30* 37* 17 22  CREATININE 1.66* 1.90* 1.26* 1.55*  CALCIUM  8.8*  --  9.9 9.4  MG  --   --  2.0  --    Liver Function Tests: Recent Labs    08/25/23 1753 09/05/23 1346 09/30/23 2010  AST 19 18 23   ALT 9 6 12   ALKPHOS 66  --  82  BILITOT 0.2 0.5 0.5  PROT 6.4* 7.6 7.0  ALBUMIN 2.9*  --  3.3*   No results for input(s): "LIPASE", "AMYLASE" in the last 8760 hours. No results for input(s): "AMMONIA" in the last 8760 hours. CBC: Recent Labs    08/25/23 1753 08/25/23 1807 09/05/23 1346 09/30/23 2010  WBC 5.7  --  4.9 5.0  NEUTROABS 3.8  --  3,347 3.4  HGB 11.1* 11.2* 12.7 11.6*  HCT 35.4* 33.0* 39.2 36.6  MCV 88.9  --  85.2 87.8  PLT 248  --  284 255   Lipid Panel: Recent Labs    02/11/23 1524 05/16/23 1359 09/05/23 1346  CHOL 278* 253* 266*  HDL 89 89 78  LDLCALC 173* 146* 172*  TRIG 67 75 69  CHOLHDL 3.1 2.8 3.4   Lab Results  Component Value Date   HGBA1C 6.8 (H) 09/05/2023    Procedures since last visit: DG Chest Portable 1 View Result Date: 09/30/2023 CLINICAL DATA:  Shortness of breath, altered mental status. EXAM: PORTABLE CHEST 1 VIEW COMPARISON:  PA and lateral chest 01/19/2021. FINDINGS: There are artifacts from overlying clothing. There is a metal fingernail clipper superimposing over the crest of the left diaphragm. The lungs are low in volume and demonstrate fibrotic interstitial coarsening. No focal pneumonic infiltrate is seen, with limited view of the bases. There is no substantial pleural effusion. The cardiomediastinal silhouette and vasculature are normal. Thoracic spondylosis. IMPRESSION: Low lung volumes with fibrotic  interstitial coarsening. No evidence of acute chest disease. Limited view of the bases. Electronically Signed   By: Denman Fischer M.D.   On: 09/30/2023 22:31   CT HEAD WO CONTRAST ( ) Result Date: 09/30/2023 CLINICAL DATA:  Head trauma, minor (Age >= 65y) EXAM: CT HEAD WITHOUT CONTRAST TECHNIQUE: Contiguous axial images were obtained from the base of the skull through the vertex without intravenous contrast. RADIATION DOSE REDUCTION: This exam was performed according to the departmental dose-optimization program which includes automated exposure control, adjustment of the mA and/or kV according to patient size and/or use of iterative reconstruction technique. COMPARISON:  CT head March 13, 25. FINDINGS: Brain: Similar left parietal encephalomalacia and chronic microvascular ischemic disease. No evidence of acute large vascular territory infarct, acute hemorrhage, mass lesion, midline shift or hydrocephalus. Vascular: No hyperdense vessel identified. Skull: No acute fracture. Sinuses/Orbits: Clear sinuses.  No acute orbital findings. Other: No mastoid effusions. IMPRESSION: 1. No evidence of acute intracranial abnormality. 2. Similar left parietal encephalomalacia and chronic microvascular ischemic disease. Electronically Signed   By: Stevenson Elbe M.D.   On: 09/30/2023 22:09    Assessment/Plan  1. Severe vascular dementia with other behavioral disturbance (HCC) (Primary) -  Symptoms worsened with memory and cognitive decline, affecting daily activities. - Continue donepezil  5 mg daily. - continue Seroquel  12.5 mg daily  - QUEtiapine  (SEROQUEL ) 25 MG tablet; Take 0.5 tablets (12.5 mg total) by mouth daily.  Dispense: 90 tablet; Refill: 1 - Ambulatory referral to Home Health PT and OT for therapeutic strengthening exercises, Speech Therapy for cognitive exercises, Nurse for medication management and Aide for ADL assistance - Ambulatory referral to Neurology  2. Mixed hyperlipidemia Lab Results   Component Value Date   CHOL 266 (H) 09/05/2023  HDL 78 09/05/2023   LDLCALC 172 (H) 09/05/2023   TRIG 69 09/05/2023   CHOLHDL 3.4 09/05/2023    -  continue Atorvastatin  40 mg daily  3. Benign hypertension with chronic kidney disease, stage III (HCC) -  Blood pressure well-controlled at 136/80 mmHg. - Continue amlodipine 10 mg daily. -  check BP daily, log and bring to next appointment  4. History of stroke -  continue Plavix  75 mg daily and Atorvastatin  40 mg daily  5. Type 2 diabetes mellitus with stage 3b chronic kidney disease, without long-term current use of insulin  (HCC) Lab Results  Component Value Date   HGBA1C 6.8 (H) 09/05/2023    -  diet-controlled  6. Anxiety -  continue Lorazepam   0.5 mg at bedtime PRN - LORazepam  (ATIVAN ) 0.5 MG tablet; TAKE ONE TABLET BY MOUTH AT BEDTIME AS NEEDED FOR ANXIETY  Dispense: 30 tablet; Refill: 0     Labs/tests ordered:  None   Return in about 4 weeks (around 11/08/2023), or if symptoms worsen or fail to improve.  Gerrad Welker Medina-Vargas, NP

## 2023-10-11 NOTE — Telephone Encounter (Signed)
 Message routed to referral coordinator Carla.B

## 2023-10-11 NOTE — Telephone Encounter (Signed)
 Noted! Thank you

## 2023-10-11 NOTE — Telephone Encounter (Signed)
 Message routed to PCP Medina-Vargas, Monina C, NP scheduled to see patient today.

## 2023-10-13 ENCOUNTER — Telehealth: Payer: Self-pay | Admitting: *Deleted

## 2023-10-13 DIAGNOSIS — E1122 Type 2 diabetes mellitus with diabetic chronic kidney disease: Secondary | ICD-10-CM

## 2023-10-13 DIAGNOSIS — R413 Other amnesia: Secondary | ICD-10-CM

## 2023-10-13 NOTE — Progress Notes (Signed)
 Complex Care Management Note  Care Guide Note 10/13/2023 Name: KECHA LIDDIARD MRN: [id number] DOB: 1950-08-13  MARCOS CHEEVES is a 73 y.o. year old female who sees Medina-Vargas, Monina C, NP for primary care. I reached out to Myrick Ask by phone today to offer complex care management services.  Ms. Julia was given information about Complex Care Management services today including:   The Complex Care Management services include support from the care team which includes your Nurse Care Manager, Clinical Social Worker, or Pharmacist.  The Complex Care Management team is here to help remove barriers to the health concerns and goals most important to you. Complex Care Management services are voluntary, and the patient may decline or stop services at any time by request to their care team member.   Complex Care Management Consent Status: Patient agreed to services and verbal consent obtained.   Follow up plan:  Telephone appointment with complex care management team member scheduled for:  5/14  Encounter Outcome:  Patient Scheduled  Barnie Bora  [facility]  [facility], [facility] Guide  Direct Dial: [phone]  Fax [phone]

## 2023-10-17 ENCOUNTER — Telehealth: Payer: Self-pay | Admitting: *Deleted

## 2023-10-17 ENCOUNTER — Telehealth: Admitting: *Deleted

## 2023-10-17 NOTE — Telephone Encounter (Signed)
 Copied from CRM [id number]. Topic: Clinical - Home Health Verbal Orders >> Layia Walla 5, 2025 10:03 AM Shelby Dessert H wrote: Caller/Agency: Marita Sidle [facility] Callback Number: [phone] Service Requested: Physical Therapy, Social Worker Eval.  Frequency: 1 time a week for 5 weeks,  Any new concerns about the patient? No   Verbal orders given to Craig.

## 2023-10-17 NOTE — Telephone Encounter (Signed)
 Copied from CRM [id number]. Topic: Clinical - Home Health Verbal Orders >> Passion Lavin 5, 2025  9:31 AM Arlie Benedict B wrote: Caller/Agency: Valarie Garner of [facility] Callback Number: [phone] Service Requested: Skilled Nursing Frequency: 1 week for 4 weeks, 1 month @1  and 2 as needed visits  Any new concerns about the patient? No Requesting care based on diagnosis of hypertension dementia anxiety and chronic pain    Called Dina Francisco and Verbal orders given.

## 2023-10-19 ENCOUNTER — Telehealth: Admitting: Licensed Clinical Social Worker

## 2023-10-26 ENCOUNTER — Other Ambulatory Visit: Payer: Self-pay | Admitting: Licensed Clinical Social Worker

## 2023-10-26 NOTE — Patient Instructions (Signed)
 Visit Information  Thank you for taking time to visit with me today. Please don't hesitate to contact me if I can be of assistance to you before our next scheduled appointment.  Our next appointment is by telephone on 5/21 at 3:30 PM Please call the care guide team at [phone] if you need to cancel or reschedule your appointment.   Following is a copy of your care plan:   Goals Addressed             This Visit's Progress    LCSW VBCI Social Work Care Plan   On track    Problems:   Level of Care Concerns:Inability to perform ADL's independently Inability to perform IADL's independently Memory Deficits  CSW Clinical Goal(s):   Over the next 60 days the Patient will attend all scheduled medical appointments as evidenced by patient report and care team review of appointment completion in electronic MEDICAL RECORD NUMBER  work with Social Worker to address concerns related to level of care.  Interventions:  Level of Care Concerns in a patient with CKD Stage 3, Dementia, and DMII Current level of care: Home with other family or significant other(s): spouse  Evaluation of patient's unmet needs in current living environment ADL's Assessed needs, level of care concerns, how currently meeting needs and barriers to care Discuss community support options (LCSW discussed agencies such as [facility] and [facility]) Discussed private pay options for personal care needs (LCSW emailed listing for review) Problem Solving/Task Center strategies reviewed Provided list of PCS agencies and what to expect with PCS process  Facility  Assessed needs and reviewed facility placement process; as well as the different levels of care Patient is not interested in placement at this time  Patient Goals/Self-Care Activities:  Call Department of Social Services [phone] to apply for [facility] Medicaid. Continue taking your medication as prescribed.   Follow up on insurance options to inquire about companionship  aid hours.  Plan:   Telephone follow up appointment with care management team member scheduled for:  1 week        Please call the Suicide and Crisis Lifeline: 988 go to [facility] Urgent [facility] [address] [place] [phone]) call 911 if you are experiencing a Mental Health or Behavioral Health Crisis or need someone to talk to.  Patient verbalizes understanding of instructions and care plan provided today and agrees to view in MyChart. Active MyChart status and patient understanding of how to access instructions and care plan via MyChart confirmed with patient.     Arlis Bent [facility]  [facility], [facility] Clinical Social Worker Direct Dial: [phone]  Fax: [phone] Website: [email or web address] 11:52 AM

## 2023-10-26 NOTE — Patient Outreach (Signed)
 Complex Care Management   Visit Note  10/26/2023  Name:  Wendy [name] MRN: [id number] DOB: 18-Dec-1950  Situation: Referral received for Complex Care Management related to Diabetes with Complications and Dementia I obtained verbal consent from Caregiver.  Visit completed with pt's son, Carolyn Cisco and daughter in law [place]  on the phone  Background:   Past Medical History:  Diagnosis Date   Acute right ankle pain 05/25/2018   Diabetes mellitus    Hypertension    Pain in gums 02/25/2020   Peroneal tendinitis, right leg 05/30/2018   Pigmented skin lesion of uncertain nature 07/16/2019   Ms. Forst reports a 3 mo hx of a skin lesion, centrally located below her breasts. She is concerned because her sister developed breast cancer after noticing a spot on her breast. Patient reports no bleeding or itching. She states it is not getting progressively larger. She states she accidentally scraped it one day which was painful. She has never had anything like this before. She denies a fami   Retinopathy    Sarcoidosis    Screening mammogram, encounter for 06/29/2012   Stroke [facility])     Assessment: Patient Reported Symptoms:  Cognitive Cognitive Status: Alert and oriented to person, place, and time, Struggling with memory recall Cognitive/Intellectual Conditions Management [RPT]: None reported or documented in medical history or problem list      Neurological   Neurological Conditions: Dementia, Stroke, ischemic Neurological Management Strategies: Routine screening, Medication therapy  HEENT HEENT Symptoms Reported: No symptoms reported      Cardiovascular Cardiovascular Symptoms Reported: No symptoms reported Does patient have uncontrolled Hypertension?: No Cardiovascular Management Strategies: Routine screening  Respiratory Respiratory Symptoms Reported: No symptoms reported    Endocrine Patient reports the following symptoms related to hypoglycemia or hyperglycemia : No symptoms  reported Is patient diabetic?: Yes Endocrine Conditions: Diabetes Endocrine Management Strategies: Medication therapy, Routine screening  Gastrointestinal Gastrointestinal Symptoms Reported: No symptoms reported      Genitourinary Genitourinary Symptoms Reported: No symptoms reported Genitourinary Conditions: Chronic kidney disease Genitourinary Management Strategies: Medication therapy  Integumentary Integumentary Symptoms Reported: No symptoms reported    Musculoskeletal Musculoskelatal Symptoms Reviewed: Difficulty walking, Unsteady gait Additional Musculoskeletal Details: family reports pt has ongoing pain/swelling in left knee. She is participaitng in PT Musculoskeletal Conditions: Osteoarthritis Musculoskeletal Management Strategies: Routine screening, Medication therapy Falls in the past year?: No Number of falls in past year: 1 or less Was there an injury with Fall?: No Fall Risk Category Calculator: 0 Patient Fall Risk Level: Low Fall Risk Patient at Risk for Falls Due to: Impaired balance/gait Fall risk Follow up: Falls prevention discussed  Psychosocial Psychosocial Symptoms Reported: No symptoms reported Additional Psychological Details: Family reports that pt is having difficulty adjusting to chronic health conditions that have changed her ability to verbally communication, in addition, to recent move to apartment with spouse Behavioral Health Conditions: Dementia Behavioral Management Strategies: Coping strategies, Medication therapy, Support system Major Change/Loss/Stressor/Fears (CP): Medical condition, family, Medical condition, self Quality of Family Relationships: helpful, involved, supportive Do you feel physically threatened by others?: No      10/11/2023   11:28 AM  Depression screen PHQ 2/9  Decreased Interest 0  Down, Depressed, Hopeless 0  PHQ - 2 Score 0    There were no vitals filed for this visit.  Medications Reviewed Today     Reviewed by  Adriana Albany, LCSW (Social Worker) on 10/26/23 at 1055  Med List Status: <None>   Medication Order Taking? Sig  Documenting Provider Last Dose Status Informant  amLODipine  (NORVASC ) 10 MG tablet [id number] Yes Take 1 tablet (10 mg total) by mouth daily. Medina-Vargas, Monina C, NP Taking Active   atorvastatin  (LIPITOR) 40 MG tablet [id number] Yes Take 1 tablet (40 mg total) by mouth daily. Medina-Vargas, Monina C, NP Taking Active   clopidogrel  (PLAVIX ) 75 MG tablet [id number] Yes TAKE ONE TABLET BY MOUTH ONE TIME DAILY Medina-Vargas, Monina C, NP Taking Active   donepezil  (ARICEPT ) 5 MG tablet [id number] Yes Take 1 tablet (5 mg total) by mouth at bedtime. Medina-Vargas, Monina C, NP Taking Active   LORazepam  (ATIVAN ) 0.5 MG tablet [id number] Yes TAKE ONE TABLET BY MOUTH AT BEDTIME AS NEEDED FOR ANXIETY Medina-Vargas, Monina C, NP Taking Active   QUEtiapine  (SEROQUEL ) 25 MG tablet [id number] Yes Take 0.5 tablets (12.5 mg total) by mouth daily. Medina-Vargas, Monina C, NP Taking Active             Recommendation:   Continue utilizing strategies discussed to assist with symptom management  Follow Up Plan:   Telephone follow-up in 1 week  Alease Hunter, LCSW [place]  [facility], [facility] Clinical Social Worker Direct Dial: [phone]  Fax: [phone] Website: [email or web address] 11:52 AM

## 2023-11-02 ENCOUNTER — Other Ambulatory Visit: Payer: Self-pay | Admitting: Licensed Clinical Social Worker

## 2023-11-02 ENCOUNTER — Telehealth: Payer: Self-pay

## 2023-11-02 NOTE — Telephone Encounter (Signed)
 Copied from CRM [id number]. Topic: Appointments - Transfer of Care >> Nov 02, 2023  2:50 PM Leah C wrote: Pt is requesting to transfer FROM: Medina-Vargas, Monina C  Pt is requesting to transfer TO: Dr. Jolena Nay Copland Reason for requested transfer: Establish New Care It is the responsibility of the team the patient would like to transfer to (Dr. Geralyn Knee) to reach out to the patient if for any reason this transfer is not acceptable.

## 2023-11-02 NOTE — Telephone Encounter (Signed)
 Please call   I do not advocate elderly patients changing medical doctors.  It looks like she sees a geriatric provider with [facility] and has advanced dementia.  A stable medical home and primary care provider is usually helpful for complex geriatric patients.   If there is a specific barrier to care, then I can take over care, but I would recommend staying with the same long-term doctor.

## 2023-11-03 NOTE — Telephone Encounter (Signed)
 Called pt lvm to call office.

## 2023-11-03 NOTE — Telephone Encounter (Signed)
 Noted

## 2023-11-04 ENCOUNTER — Telehealth: Payer: Self-pay | Admitting: Adult Health

## 2023-11-04 NOTE — Telephone Encounter (Signed)
 [email or web address]

## 2023-11-04 NOTE — Patient Outreach (Signed)
 Complex Care Management   Visit Note  11/02/2023  Name:  Wendy [name] MRN: [id number] DOB: October 02, 1950  Situation: Referral received for Complex Care Management related to Caregiver Stress I obtained verbal consent from Caregiver.  Visit completed with Caregiver  on the phone  Background:   Past Medical History:  Diagnosis Date   Acute right ankle pain 05/25/2018   Diabetes mellitus    Hypertension    Pain in gums 02/25/2020   Peroneal tendinitis, right leg 05/30/2018   Pigmented skin lesion of uncertain nature 07/16/2019   Wendy [name] reports a 3 mo hx of a skin lesion, centrally located below her breasts. She is concerned because her sister developed breast cancer after noticing a spot on her breast. Patient reports no bleeding or itching. She states it is not getting progressively larger. She states she accidentally scraped it one day which was painful. She has never had anything like this before. She denies a fami   Retinopathy    Sarcoidosis    Screening mammogram, encounter for 06/29/2012   Stroke [facility])     Assessment: Patient Reported Symptoms:  Cognitive        Neurological      HEENT        Cardiovascular      Respiratory      Endocrine      Gastrointestinal        Genitourinary      Integumentary      Musculoskeletal          Psychosocial              10/11/2023   11:28 AM  Depression screen PHQ 2/9  Decreased Interest 0  Down, Depressed, Hopeless 0  PHQ - 2 Score 0    There were no vitals filed for this visit.  Medications Reviewed Today     Reviewed by Adriana Albany, LCSW (Social Worker) on 11/04/23 at 1134  Med List Status: <None>   Medication Order Taking? Sig Documenting Provider Last Dose Status Informant  amLODipine  (NORVASC ) 10 MG tablet [id number] No Take 1 tablet (10 mg total) by mouth daily. Medina-Vargas, Monina C, NP Taking Active   atorvastatin  (LIPITOR) 40 MG tablet [id number] No Take 1 tablet (40 mg total) by mouth  daily. Medina-Vargas, Monina C, NP Taking Active   clopidogrel  (PLAVIX ) 75 MG tablet [id number] No TAKE ONE TABLET BY MOUTH ONE TIME DAILY Medina-Vargas, Monina C, NP Taking Active   donepezil  (ARICEPT ) 5 MG tablet [id number] No Take 1 tablet (5 mg total) by mouth at bedtime. Medina-Vargas, Monina C, NP Taking Active   LORazepam  (ATIVAN ) 0.5 MG tablet [id number] No TAKE ONE TABLET BY MOUTH AT BEDTIME AS NEEDED FOR ANXIETY Medina-Vargas, Monina C, NP Taking Active   QUEtiapine  (SEROQUEL ) 25 MG tablet [id number] No Take 0.5 tablets (12.5 mg total) by mouth daily. Medina-Vargas, Monina C, NP Taking Active             Recommendation:   Continue utilizing strategies discussed to assist with symptom management  Follow Up Plan:   Telephone follow-up in 1 month  Alease Hunter, LCSW [facility]  [facility], [facility] Clinical Social Worker Direct Dial: [phone]  Fax: [phone] Website: [email or web address] 11:37 AM

## 2023-11-04 NOTE — Patient Outreach (Signed)
 Complex Care Management   Visit Note  11/02/2023  Name:  Wendy [name] MRN: [id number] DOB: 12-09-1950  Situation: Referral received for Complex Care Management related to Dementia I obtained verbal consent from Caregiver.  Visit completed with Caregiver, Wendy [name]  on the phone  Background:   Past Medical History:  Diagnosis Date   Acute right ankle pain 05/25/2018   Diabetes mellitus    Hypertension    Pain in gums 02/25/2020   Peroneal tendinitis, right leg 05/30/2018   Pigmented skin lesion of uncertain nature 07/16/2019   [name] reports a 3 mo hx of a skin lesion, centrally located below her breasts. She is concerned because her sister developed breast cancer after noticing a spot on her breast. Patient reports no bleeding or itching. She states it is not getting progressively larger. She states she accidentally scraped it one day which was painful. She has never had anything like this before. She denies a fami   Retinopathy    Sarcoidosis    Screening mammogram, encounter for 06/29/2012   Stroke [facility])     Assessment: Patient Reported Symptoms:  Cognitive Cognitive Status: Struggling with memory recall Cognitive/Intellectual Conditions Management [RPT]: None reported or documented in medical history or problem list      Neurological   Neurological Conditions: Dementia, Stroke, ischemic Neurological Management Strategies: Routine screening, Medication therapy  HEENT HEENT Symptoms Reported: No symptoms reported      Cardiovascular Cardiovascular Symptoms Reported: No symptoms reported    Respiratory Respiratory Symptoms Reported: No symptoms reported    Endocrine Patient reports the following symptoms related to hypoglycemia or hyperglycemia : No symptoms reported Is patient diabetic?: Yes Endocrine Conditions: Diabetes Endocrine Management Strategies: Medication therapy, Routine screening  Gastrointestinal Gastrointestinal Symptoms Reported: No symptoms reported       Genitourinary Genitourinary Symptoms Reported: No symptoms reported    Integumentary Integumentary Symptoms Reported: No symptoms reported    Musculoskeletal Musculoskelatal Symptoms Reviewed: Difficulty walking, Unsteady gait Additional Musculoskeletal Details: Pt participates in [facility] PT Musculoskeletal Conditions: Osteoarthritis Musculoskeletal Management Strategies: Routine screening, Medication therapy      Psychosocial Psychosocial Symptoms Reported: No symptoms reported Additional Psychological Details: Pt continues to adjust to move and chronic health conditions. Son endorsed caregiver stress, LCSW discussed strategies to assist with management of family and resources. Behavioral Health Conditions: Dementia Behavioral Management Strategies: Community resources, Support system, Coping strategies Major Change/Loss/Stressor/Fears (CP): Medical condition, family, Medical condition, self        10/11/2023   11:28 AM  Depression screen PHQ 2/9  Decreased Interest 0  Down, Depressed, Hopeless 0  PHQ - 2 Score 0    There were no vitals filed for this visit.  Medications Reviewed Today     Reviewed by Adriana Albany, LCSW (Social Worker) on 11/04/23 at 1134  Med List Status: <None>   Medication Order Taking? Sig Documenting Provider Last Dose Status Informant  amLODipine  (NORVASC ) 10 MG tablet [id number] No Take 1 tablet (10 mg total) by mouth daily. Medina-Vargas, Monina C, NP Taking Active   atorvastatin  (LIPITOR) 40 MG tablet [id number] No Take 1 tablet (40 mg total) by mouth daily. Medina-Vargas, Monina C, NP Taking Active   clopidogrel  (PLAVIX ) 75 MG tablet [id number] No TAKE ONE TABLET BY MOUTH ONE TIME DAILY Medina-Vargas, Monina C, NP Taking Active   donepezil  (ARICEPT ) 5 MG tablet [id number] No Take 1 tablet (5 mg total) by mouth at bedtime. Medina-Vargas, Monina C, NP Taking Active   LORazepam  (ATIVAN ) 0.5 MG tablet  [id number] No TAKE ONE TABLET BY MOUTH AT BEDTIME AS  NEEDED FOR ANXIETY Medina-Vargas, Monina C, NP Taking Active   QUEtiapine  (SEROQUEL ) 25 MG tablet [id number] No Take 0.5 tablets (12.5 mg total) by mouth daily. Medina-Vargas, Monina C, NP Taking Active             Recommendation:   Continue utilizing strategies discussed to assist with symptom management  Follow Up Plan:   Telephone follow-up in 1 month  Alease Hunter, LCSW [facility]  [facility], [facility] Clinical Social Worker Direct Dial: [phone]  Fax: [phone] Website: [email or web address] 11:47 AM

## 2023-11-04 NOTE — Telephone Encounter (Signed)
 Copied from CRM [id number]. Topic: Clinical - Home Health Verbal Orders >> Nov 04, 2023  2:32 PM Shelby Dessert H wrote: Caller/Agency: [facility] Callback Number: [phone] Service Requested: Speech Therapy Frequency: 1 week 6 Any new concerns about the patient? No   Please advise

## 2023-11-04 NOTE — Patient Instructions (Signed)
 Visit Information  Thank you for taking time to visit with me today. Please don't hesitate to contact me if I can be of assistance to you before our next scheduled appointment.  Your next care management appointment is by telephone on 6/18 at 3:30 PM  Please call the care guide team at [phone] if you need to cancel, schedule, or reschedule an appointment.   Please call the Suicide and Crisis Lifeline: 988 go to [facility] Urgent [facility] [address], [place] [phone]) call 911 if you are experiencing a Mental Health or Behavioral Health Crisis or need someone to talk to.  Alease Hunter, LCSW [place]  [facility], [facility] Clinical Social Worker Direct Dial: [phone]  Fax: [phone] Website: [email or web address] 11:46 AM

## 2023-11-06 NOTE — Telephone Encounter (Signed)
 Patient may have speech therapy per request.

## 2023-11-08 NOTE — Telephone Encounter (Signed)
 Called and given Verbal .

## 2023-11-10 DIAGNOSIS — Z556 Problems related to health literacy: Secondary | ICD-10-CM

## 2023-11-10 DIAGNOSIS — E1136 Type 2 diabetes mellitus with diabetic cataract: Secondary | ICD-10-CM

## 2023-11-10 DIAGNOSIS — M1712 Unilateral primary osteoarthritis, left knee: Secondary | ICD-10-CM

## 2023-11-10 DIAGNOSIS — H25813 Combined forms of age-related cataract, bilateral: Secondary | ICD-10-CM

## 2023-11-10 DIAGNOSIS — M1611 Unilateral primary osteoarthritis, right hip: Secondary | ICD-10-CM

## 2023-11-10 DIAGNOSIS — E782 Mixed hyperlipidemia: Secondary | ICD-10-CM

## 2023-11-10 DIAGNOSIS — Z87891 Personal history of nicotine dependence: Secondary | ICD-10-CM

## 2023-11-10 DIAGNOSIS — N1832 Chronic kidney disease, stage 3b: Secondary | ICD-10-CM | POA: Diagnosis not present

## 2023-11-10 DIAGNOSIS — E1122 Type 2 diabetes mellitus with diabetic chronic kidney disease: Secondary | ICD-10-CM | POA: Diagnosis not present

## 2023-11-10 DIAGNOSIS — Z79899 Other long term (current) drug therapy: Secondary | ICD-10-CM

## 2023-11-10 DIAGNOSIS — E11319 Type 2 diabetes mellitus with unspecified diabetic retinopathy without macular edema: Secondary | ICD-10-CM | POA: Diagnosis not present

## 2023-11-10 DIAGNOSIS — Z7901 Long term (current) use of anticoagulants: Secondary | ICD-10-CM

## 2023-11-10 DIAGNOSIS — Z8673 Personal history of transient ischemic attack (TIA), and cerebral infarction without residual deficits: Secondary | ICD-10-CM

## 2023-11-10 DIAGNOSIS — I129 Hypertensive chronic kidney disease with stage 1 through stage 4 chronic kidney disease, or unspecified chronic kidney disease: Secondary | ICD-10-CM | POA: Diagnosis not present

## 2023-11-14 ENCOUNTER — Encounter: Admitting: Adult Health

## 2023-11-15 NOTE — Progress Notes (Signed)
 This encounter was created in error - please disregard.

## 2023-11-23 ENCOUNTER — Other Ambulatory Visit: Payer: Self-pay | Admitting: Adult Health

## 2023-11-23 DIAGNOSIS — F419 Anxiety disorder, unspecified: Secondary | ICD-10-CM

## 2023-11-24 NOTE — Telephone Encounter (Signed)
 Patient has request for refill on Lorazepam . Patient last refill was 10/11/2023 Patient has contract on file dated 09/07/2023. Medication pend and sent to PCP (Medina-Vargas, Monina C, NP) for approval.

## 2023-11-30 ENCOUNTER — Other Ambulatory Visit: Payer: Self-pay | Admitting: Licensed Clinical Social Worker

## 2023-11-30 NOTE — Patient Outreach (Signed)
 Complex Care Management   Visit Note  11/30/2023  Name:  Wendy [name] MRN: [id number] DOB: 1950/08/22  Situation: Referral received for Complex Care Management related to Dementia I obtained verbal consent from Caregiver.  Visit completed with pt's son, Wendy [name]  on the phone  Background:   Past Medical History:  Diagnosis Date   Acute right ankle pain 05/25/2018   Diabetes mellitus    Hypertension    Pain in gums 02/25/2020   Peroneal tendinitis, right leg 05/30/2018   Pigmented skin lesion of uncertain nature 07/16/2019   Wendy [name] reports a 3 mo hx of a skin lesion, centrally located below her breasts. She is concerned because her sister developed breast cancer after noticing a spot on her breast. Patient reports no bleeding or itching. She states it is not getting progressively larger. She states she accidentally scraped it one day which was painful. She has never had anything like this before. She denies a fami   Retinopathy    Sarcoidosis    Screening mammogram, encounter for 06/29/2012   Stroke Wendy [name])     Assessment: Patient Reported Symptoms:  Cognitive Cognitive Status: Able to follow simple commands, Alert and oriented to person, place, and time Cognitive/Intellectual Conditions Management [RPT]: None reported or documented in medical history or problem list      Neurological Neurological Review of Symptoms: No symptoms reported Neurological Conditions: Dementia, Stroke, ischemic  HEENT HEENT Symptoms Reported: No symptoms reported      Cardiovascular Cardiovascular Symptoms Reported: No symptoms reported Does patient have uncontrolled Hypertension?: No    Respiratory Respiratory Symptoms Reported: No symptoms reported    Endocrine Patient reports the following symptoms related to hypoglycemia or hyperglycemia : No symptoms reported Is patient diabetic?: Yes Endocrine Conditions: Diabetes  Gastrointestinal Gastrointestinal Symptoms Reported: No symptoms reported       Genitourinary Genitourinary Symptoms Reported: No symptoms reported Genitourinary Conditions: Chronic kidney disease Genitourinary Management Strategies: Medication therapy  Integumentary Integumentary Symptoms Reported: No symptoms reported    Musculoskeletal Musculoskelatal Symptoms Reviewed: Difficulty walking, Unsteady gait        Psychosocial Psychosocial Symptoms Reported: No symptoms reported Additional Psychological Details: Pt's son reports pt is doing well and is stable. They have not needed to obtain an aid at this time. Behavioral Health Conditions: Dementia Behavioral Management Strategies: Support system, Coping strategies Major Change/Loss/Stressor/Fears (CP): Medical condition, self, Medical condition, family Quality of Family Relationships: helpful, supportive, involved      10/11/2023   11:28 AM  Depression screen PHQ 2/9  Decreased Interest 0  Down, Depressed, Hopeless 0  PHQ - 2 Score 0    There were no vitals filed for this visit.  Medications Reviewed Today     Reviewed by Timmey Lamba D, LCSW (Social Worker) on 11/30/23 at 1539  Med List Status: <None>   Medication Order Taking? Sig Documenting Provider Last Dose Status Informant  amLODipine  (NORVASC ) 10 MG tablet [id number] No Take 1 tablet (10 mg total) by mouth daily. Medina-Vargas, Monina C, NP Taking Active   atorvastatin  (LIPITOR) 40 MG tablet [id number] No Take 1 tablet (40 mg total) by mouth daily. Medina-Vargas, Monina C, NP Taking Active   clopidogrel  (PLAVIX ) 75 MG tablet [id number] No TAKE ONE TABLET BY MOUTH ONE TIME DAILY Medina-Vargas, Monina C, NP Taking Active   donepezil  (ARICEPT ) 5 MG tablet [id number] No Take 1 tablet (5 mg total) by mouth at bedtime. Medina-Vargas, Monina C, NP Taking Active   LORazepam  (ATIVAN ) 0.5 MG tablet [id number]  TAKE ONE TABLET BY MOUTH AT BEDTIME AS NEEDED FOR ANXIETY Medina-Vargas, Monina C, NP  Active   QUEtiapine  (SEROQUEL ) 25 MG tablet [id number] No Take  0.5 tablets (12.5 mg total) by mouth daily. Medina-Vargas, Monina C, NP Taking Active             Recommendation:   Continue Current Plan of Care  Follow Up Plan:   Telephone follow-up in 1 month  Alease Hunter, LCSW [facility]  [name], [facility] Clinical Social Worker Direct Dial: [phone]  Fax: [phone] Website: [email or web address] 3:52 PM

## 2023-11-30 NOTE — Patient Instructions (Signed)
 Visit Information  Thank you for taking time to visit with me today. Please don't hesitate to contact me if I can be of assistance to you before our next scheduled appointment.  Your next care management appointment is by telephone on 07/17 at 3:30 PM  Please call the care guide team at [phone] if you need to cancel, schedule, or reschedule an appointment.   Please call the Suicide and Crisis Lifeline: 988 call 1-800-273-TALK (toll free, 24 hour hotline) call 911 if you are experiencing a Mental Health or Behavioral Health Crisis or need someone to talk to.  Alease Hunter, LCSW [place]  [facility], [facility] Clinical Social Worker Direct Dial: [phone]  Fax: [phone] Website: [email or web address] 3:52 PM

## 2023-12-29 ENCOUNTER — Telehealth: Payer: Self-pay | Admitting: Licensed Clinical Social Worker

## 2023-12-29 ENCOUNTER — Ambulatory Visit: Payer: Medicare Other | Admitting: Neurology

## 2023-12-29 ENCOUNTER — Encounter: Payer: Self-pay | Admitting: Licensed Clinical Social Worker

## 2023-12-29 NOTE — Patient Instructions (Signed)
 Nena JULIANNA Lesches - I am sorry I was unable to reach you today for our scheduled appointment. I work with Medina-Vargas, Jereld BROCKS, NP and am calling to support your healthcare needs. Please contact me at [phone] at your earliest convenience. I look forward to speaking with you soon.   Thank you,  Rolin Kerns, LCSW [place]  [facility] Clinical Social Worker Direct Dial: [phone]  Fax: [phone] Website: [email or web address] 4:12 PM

## 2024-01-13 NOTE — Telephone Encounter (Signed)
 Spoke to patient's son, Jamie, relayed info from Dr. Watt as written below. They made the decision to be seen at [facility] because the family and patient now live in [place], with job and care for parent with dementia this location is closer.  She has three sons and they take turns bringing her to appointments

## 2024-01-15 ENCOUNTER — Encounter: Payer: Self-pay | Admitting: Family Medicine

## 2024-01-15 NOTE — Progress Notes (Unsigned)
     Kura Bethards T. Tywaun Hiltner, MD, CAQ Sports Medicine [facility] at [facility] [address] [place], [address]  Phone: [phone]  FAX: [phone]  Wendy [name] - 73 y.o. female  MRN [id number]  Date of Birth: Jan 09, 1951  Date: 01/16/2024  PCP: Phyllis Jereld BROCKS, NP  Referral: Medina-Vargas, Jereld BROCKS*  No chief complaint on file.  Subjective:   Wendy [name] is a 73 y.o. very pleasant female patient with There is no height or weight on file to calculate BMI. who presents with the following:    Review of Systems is noted in the HPI, as appropriate  Objective:   There were no vitals taken for this visit.  GEN: No acute distress; alert,appropriate. PULM: Breathing comfortably in no respiratory distress PSYCH: Normally interactive.   Laboratory and Imaging Data:  Assessment and Plan:   ***

## 2024-01-16 ENCOUNTER — Ambulatory Visit (INDEPENDENT_AMBULATORY_CARE_PROVIDER_SITE_OTHER): Admitting: Family Medicine

## 2024-01-16 ENCOUNTER — Encounter: Payer: Self-pay | Admitting: Family Medicine

## 2024-01-16 VITALS — BP 130/64 | HR 89 | Temp 99.0°F | Ht 65.0 in | Wt 142.5 lb

## 2024-01-16 DIAGNOSIS — E782 Mixed hyperlipidemia: Secondary | ICD-10-CM | POA: Diagnosis not present

## 2024-01-16 DIAGNOSIS — F0150 Vascular dementia without behavioral disturbance: Secondary | ICD-10-CM

## 2024-01-16 DIAGNOSIS — D638 Anemia in other chronic diseases classified elsewhere: Secondary | ICD-10-CM | POA: Diagnosis not present

## 2024-01-16 DIAGNOSIS — E11311 Type 2 diabetes mellitus with unspecified diabetic retinopathy with macular edema: Secondary | ICD-10-CM | POA: Diagnosis not present

## 2024-01-16 DIAGNOSIS — Z79899 Other long term (current) drug therapy: Secondary | ICD-10-CM

## 2024-01-16 DIAGNOSIS — N1832 Chronic kidney disease, stage 3b: Secondary | ICD-10-CM

## 2024-01-16 DIAGNOSIS — D860 Sarcoidosis of lung: Secondary | ICD-10-CM

## 2024-01-16 DIAGNOSIS — Z8673 Personal history of transient ischemic attack (TIA), and cerebral infarction without residual deficits: Secondary | ICD-10-CM

## 2024-01-16 DIAGNOSIS — I10 Essential (primary) hypertension: Secondary | ICD-10-CM

## 2024-01-16 MED ORDER — SIMVASTATIN 40 MG PO TABS: 40.0000 mg | ORAL_TABLET | Freq: Every day | ORAL | 2 refills | Status: DC

## 2024-01-16 NOTE — Patient Instructions (Signed)
 Your primary care provider sent a neurology referral in April to [facility] for you.   Please call [facility] in [place] to schedule your new patient appointment.   They have tried to contact you Please call them at [phone] to schedule your appointment.   Please let us  know if anything further is needed to assist you.

## 2024-01-17 ENCOUNTER — Encounter: Payer: Self-pay | Admitting: Family Medicine

## 2024-01-17 ENCOUNTER — Telehealth: Payer: Self-pay | Admitting: *Deleted

## 2024-01-17 ENCOUNTER — Telehealth: Payer: Self-pay

## 2024-01-17 DIAGNOSIS — F0150 Vascular dementia without behavioral disturbance: Secondary | ICD-10-CM | POA: Insufficient documentation

## 2024-01-17 HISTORY — DX: Vascular dementia, unspecified severity, without behavioral disturbance, psychotic disturbance, mood disturbance, and anxiety: F01.50

## 2024-01-17 LAB — CBC WITH DIFFERENTIAL/PLATELET
Basophils Absolute: 0 K/uL (ref 0.0–0.1)
Basophils Relative: 0.8 % (ref 0.0–3.0)
Eosinophils Absolute: 0.1 K/uL (ref 0.0–0.7)
Eosinophils Relative: 2.2 % (ref 0.0–5.0)
HCT: 39.5 % (ref 36.0–46.0)
Hemoglobin: 13 g/dL (ref 12.0–15.0)
Lymphocytes Relative: 20.6 % (ref 12.0–46.0)
Lymphs Abs: 1.2 K/uL (ref 0.7–4.0)
MCHC: 33 g/dL (ref 30.0–36.0)
MCV: 83.8 fl (ref 78.0–100.0)
Monocytes Absolute: 0.4 K/uL (ref 0.1–1.0)
Monocytes Relative: 6.9 % (ref 3.0–12.0)
Neutro Abs: 3.9 K/uL (ref 1.4–7.7)
Neutrophils Relative %: 69.5 % (ref 43.0–77.0)
Platelets: 299 K/uL (ref 150.0–400.0)
RBC: 4.71 Mil/uL (ref 3.87–5.11)
RDW: 14.7 % (ref 11.5–15.5)
WBC: 5.6 K/uL (ref 4.0–10.5)

## 2024-01-17 LAB — HEMOGLOBIN A1C: Hgb A1c MFr Bld: 7.1 % — ABNORMAL HIGH (ref 4.6–6.5)

## 2024-01-17 NOTE — Telephone Encounter (Signed)
 Pt was notified via voicemail that she needs to come back in to have her labs redrawn.

## 2024-01-17 NOTE — Telephone Encounter (Signed)
 Left message for Wendy [name] to return call to office.

## 2024-01-17 NOTE — Telephone Encounter (Signed)
 Copied from CRM #[id number]. Topic: Clinical - Medication Question >> Jan 17, 2024 10:00 AM Berneda FALCON wrote: Reason for RMF:Ojwz from [facility] states Dr. Watt prescribed simvastatin  (ZOCOR ) 40 MG tablet for this patient and the pharmacy wanted to make sure the PCP knew that  the patient was prescribed atorvastatin  20 MG prescribed December 2024 that she picked up 10 days ago by Monina Medina-Vargas.  Need to know if this is still recommended please.  Lane from [facility]-[phone]

## 2024-01-17 NOTE — Telephone Encounter (Signed)
 Ok.  This is confusing.  Her sons yesterday told me that she had not been taking her cholesterol medication for an extended amount of time.  Can you verbally confirm that she is not taking her atorvastatin  again?  I do not want her to take both.

## 2024-01-17 NOTE — Addendum Note (Signed)
 Addended by: HOPE VEVA PARAS on: 01/17/2024 04:00 PM   Modules accepted: Orders

## 2024-01-18 ENCOUNTER — Other Ambulatory Visit (INDEPENDENT_AMBULATORY_CARE_PROVIDER_SITE_OTHER)

## 2024-01-18 DIAGNOSIS — Z79899 Other long term (current) drug therapy: Secondary | ICD-10-CM | POA: Diagnosis not present

## 2024-01-18 DIAGNOSIS — E11311 Type 2 diabetes mellitus with unspecified diabetic retinopathy with macular edema: Secondary | ICD-10-CM

## 2024-01-18 LAB — TSH: TSH: 1.29 u[IU]/mL (ref 0.35–5.50)

## 2024-01-18 LAB — HEPATIC FUNCTION PANEL
ALT: 9 U/L (ref 0–35)
AST: 18 U/L (ref 0–37)
Albumin: 3.9 g/dL (ref 3.5–5.2)
Alkaline Phosphatase: 93 U/L (ref 39–117)
Bilirubin, Direct: 0.1 mg/dL (ref 0.0–0.3)
Total Bilirubin: 0.4 mg/dL (ref 0.2–1.2)
Total Protein: 7.6 g/dL (ref 6.0–8.3)

## 2024-01-18 LAB — LIPID PANEL
Cholesterol: 229 mg/dL — ABNORMAL HIGH (ref 0–200)
HDL: 75.4 mg/dL (ref >39.00)
LDL Cholesterol: 141 mg/dL — ABNORMAL HIGH (ref 0–99)
NonHDL: 153.56
Total CHOL/HDL Ratio: 3
Triglycerides: 61 mg/dL (ref 0.0–149.0)
VLDL: 12.2 mg/dL (ref 0.0–40.0)

## 2024-01-18 LAB — BASIC METABOLIC PANEL WITH GFR
BUN: 34 mg/dL — ABNORMAL HIGH (ref 6–23)
CO2: 31 meq/L (ref 19–32)
Calcium: 9.8 mg/dL (ref 8.4–10.5)
Chloride: 102 meq/L (ref 96–112)
Creatinine, Ser: 1.57 mg/dL — ABNORMAL HIGH (ref 0.40–1.20)
GFR: 32.57 mL/min — ABNORMAL LOW (ref >60.00)
Glucose, Bld: 90 mg/dL (ref 70–99)
Potassium: 4.5 meq/L (ref 3.5–5.1)
Sodium: 140 meq/L (ref 135–145)

## 2024-01-18 NOTE — Telephone Encounter (Signed)
 Spoke with Mrs. Mangham's son to let him know that they need to bring their mom back in for lab redraw.

## 2024-01-18 NOTE — Telephone Encounter (Signed)
 Spoke with Wendy [name]'s son and made him aware of phone call from [facility].  I made sure he understood to only give his mom the new cholesterol medication, Simvastatin  40 mg that Dr. Watt prescribed her on Monday.    I also called and spoke with Stuart at [facility] and ask her to cancel remaining refills on the atorvastatin  and fill only the simvastatin .

## 2024-01-19 ENCOUNTER — Ambulatory Visit: Payer: Self-pay | Admitting: Family Medicine

## 2024-02-07 ENCOUNTER — Other Ambulatory Visit: Payer: Self-pay | Admitting: Licensed Clinical Social Worker

## 2024-02-10 NOTE — Patient Instructions (Addendum)
 Visit Information  Thank you for taking time to visit with me today. Please don't hesitate to contact me if I can be of assistance to you before our next scheduled appointment.  Your next care management appointment is by telephone on 10/07 at 4 PM  Please call the care guide team at [phone] if you need to cancel, schedule, or reschedule an appointment.   Please call the Suicide and Crisis Lifeline: 988 go to [facility] Urgent [facility] [address], [place] [phone]) call 911 if you are experiencing a Mental Health or Behavioral Health Crisis or need someone to talk to.  Rolin Kerns, LCSW [place]  [facility], [facility] Clinical Social Worker Direct Dial: [phone]  Fax: [phone] Website: [email or web address] 12:12 PM

## 2024-03-20 ENCOUNTER — Telehealth: Admitting: Licensed Clinical Social Worker

## 2024-03-20 ENCOUNTER — Other Ambulatory Visit: Payer: Self-pay | Admitting: Family Medicine

## 2024-03-20 DIAGNOSIS — F419 Anxiety disorder, unspecified: Secondary | ICD-10-CM

## 2024-03-20 NOTE — Telephone Encounter (Unsigned)
 Copied from CRM [id number]. Topic: Clinical - Medication Refill >> Mar 20, 2024  4:10 PM Shereese L wrote: Medication: LORazepam  (ATIVAN ) 0.5 MG tablet  Has the patient contacted their pharmacy? Yes (Agent: If no, request that the patient contact the pharmacy for the refill. If patient does not wish to contact the pharmacy document the reason why and proceed with request.) (Agent: If yes, when and what did the pharmacy advise?)  This is the patient's preferred pharmacy:  [facility] [address] - [place], [place] - [address] [facility] [address] [facility] [address] [place] [address] Phone: [phone] Fax: [phone]  Is this the correct pharmacy for this prescription? Yes If no, delete pharmacy and type the correct one.   Has the prescription been filled recently? Yes  Is the patient out of the medication? Yes  Has the patient been seen for an appointment in the last year OR does the patient have an upcoming appointment? Yes  Can we respond through MyChart? Yes  Agent: Please be advised that Rx refills may take up to 3 business days. We ask that you follow-up with your pharmacy.

## 2024-03-20 NOTE — Telephone Encounter (Signed)
 Last office visit 01/16/24 for Establish Care.  Last refilled 06/12/20256 for #30 with 1 refill by Odella Serum, NP.  Next Appt: 07/18/2024.

## 2024-03-21 MED ORDER — LORAZEPAM 0.5 MG PO TABS: ORAL_TABLET | ORAL | 2 refills | Status: DC

## 2024-04-02 ENCOUNTER — Other Ambulatory Visit: Payer: Self-pay | Admitting: Adult Health

## 2024-04-02 DIAGNOSIS — Z113 Encounter for screening for infections with a predominantly sexual mode of transmission: Secondary | ICD-10-CM

## 2024-04-03 ENCOUNTER — Telehealth: Payer: Self-pay

## 2024-04-03 NOTE — Progress Notes (Unsigned)
 Complex Care Management Care Guide Note  04/03/2024 Name: Wendy [name] MRN: [id number] DOB: [birth year]  Wendy [name] is a 73 y.o. year old female who is a primary care patient of Copland, Jacques, MD and is actively engaged with the care management team. I reached out to Wendy [name] by phone today to assist with re-scheduling  with the Licensed Clinical Child psychotherapist.  Follow up plan: Unsuccessful telephone outreach attempt made. A HIPAA compliant phone message was left for the patient providing contact information and requesting a return call.  [facility]  [facility], [facility] Guide  Direct Dial: [phone]  Fax [phone]

## 2024-04-05 ENCOUNTER — Other Ambulatory Visit: Payer: Self-pay | Admitting: Family Medicine

## 2024-04-05 DIAGNOSIS — Z113 Encounter for screening for infections with a predominantly sexual mode of transmission: Secondary | ICD-10-CM

## 2024-04-05 DIAGNOSIS — I10 Essential (primary) hypertension: Secondary | ICD-10-CM

## 2024-04-05 MED ORDER — AMLODIPINE BESYLATE 10 MG PO TABS: 10.0000 mg | ORAL_TABLET | Freq: Every day | ORAL | 1 refills | Status: AC

## 2024-04-05 MED ORDER — CLOPIDOGREL BISULFATE 75 MG PO TABS: 75.0000 mg | ORAL_TABLET | Freq: Every day | ORAL | 1 refills | Status: AC

## 2024-04-05 NOTE — Telephone Encounter (Signed)
 Copied from CRM [id number]. Topic: Clinical - Medication Refill >> Apr 05, 2024 12:58 PM Nessti S wrote: Medication: amLODipine  (NORVASC ) 10 MG tablet; clopidogrel  (PLAVIX ) 75 MG tablet  Has the patient contacted their pharmacy? No. She has no refills (Agent: If no, request that the patient contact the pharmacy for the refill. If patient does not wish to contact the pharmacy document the reason why and proceed with request.) (Agent: If yes, when and what did the pharmacy advise?)  This is the patient's preferred pharmacy:  [facility] [address] - [place], [place] - [address] [facility] AT [facility] [address] [place] [address] Phone: [phone] Fax: [phone]  Is this the correct pharmacy for this prescription? Yes If no, delete pharmacy and type the correct one.   Has the prescription been filled recently? Yes  Is the patient out of the medication? No. Has 3 left on each  Has the patient been seen for an appointment in the last year OR does the patient have an upcoming appointment? Yes  Can we respond through MyChart? Yes  Agent: Please be advised that Rx refills may take up to 3 business days. We ask that you follow-up with your pharmacy.

## 2024-04-06 ENCOUNTER — Telehealth: Admitting: Licensed Clinical Social Worker

## 2024-05-08 ENCOUNTER — Telehealth: Payer: Self-pay | Admitting: Licensed Clinical Social Worker

## 2024-05-08 ENCOUNTER — Encounter: Payer: Self-pay | Admitting: Licensed Clinical Social Worker

## 2024-05-11 NOTE — Patient Instructions (Signed)
 Wendy [name] - I am sorry I was unable to reach you today for our scheduled appointment. I work with Watt Mirza, MD and am calling to support your healthcare needs. Please contact me at [phone] at your earliest convenience. I look forward to speaking with you soon.   Thank you,  Rolin Kerns, LCSW [place]  [facility], [facility] Clinical Social Worker Direct Dial: [phone]  Fax: [phone] Website: [email or web address] 5:09 PM

## 2024-05-30 ENCOUNTER — Encounter: Payer: Self-pay | Admitting: Family Medicine

## 2024-05-30 DIAGNOSIS — R54 Age-related physical debility: Secondary | ICD-10-CM

## 2024-05-30 DIAGNOSIS — F01B0 Vascular dementia, moderate, without behavioral disturbance, psychotic disturbance, mood disturbance, and anxiety: Secondary | ICD-10-CM

## 2024-05-30 DIAGNOSIS — Z8673 Personal history of transient ischemic attack (TIA), and cerebral infarction without residual deficits: Secondary | ICD-10-CM

## 2024-06-05 ENCOUNTER — Other Ambulatory Visit: Payer: Self-pay | Admitting: Licensed Clinical Social Worker

## 2024-06-05 NOTE — Patient Instructions (Signed)
 Visit Information  Thank you for taking time to visit with me today. Please don't hesitate to contact me if I can be of assistance to you before our next scheduled appointment.  Your next care management appointment is by telephone on 1/12 at 12:30 PM  Please call the care guide team at [phone] if you need to cancel, schedule, or reschedule an appointment.   Please call the Suicide and Crisis Lifeline: 988 go to [facility] Urgent [facility] [address], [place] [phone]) call 911 if you are experiencing a Mental Health or Behavioral Health Crisis or need someone to talk to.  Rolin Kerns, LCSW [place]  [facility], [facility] Clinical Social Worker Direct Dial: [phone]  Fax: [phone] Website: [email or web address] 1:14 PM

## 2024-06-05 NOTE — Patient Outreach (Signed)
 Complex Care Management   Visit Note  06/05/2024  Name:  Wendy [name] MRN: [id number] DOB: [birth year]  Situation: Referral received for Complex Care Management related to Dementia I obtained verbal consent from Caregiver.  Visit completed with Caregiver  on the phone  Background:   Past Medical History:  Diagnosis Date   Diabetes mellitus    Hypertension    Retinopathy    Sarcoidosis    Stroke [facility])     Assessment: Patient Reported Symptoms:  Cognitive Cognitive Status: No symptoms reported, Able to follow simple commands   Health Maintenance Behaviors: Annual physical exam  Neurological Neurological Review of Symptoms: Other: Oher Neurological Symptoms/Conditions [RPT]: Dementia Neurological Management Strategies: Medication therapy, Coping strategies, Adequate rest  HEENT HEENT Symptoms Reported: No symptoms reported      Cardiovascular Cardiovascular Symptoms Reported: No symptoms reported    Respiratory Respiratory Symptoms Reported: No symptoms reported    Endocrine Endocrine Symptoms Reported: No symptoms reported Is patient diabetic?: Yes    Gastrointestinal Gastrointestinal Symptoms Reported: No symptoms reported      Genitourinary Genitourinary Symptoms Reported: No symptoms reported    Integumentary Integumentary Symptoms Reported: No symptoms reported    Musculoskeletal Musculoskelatal Symptoms Reviewed: Difficulty walking, Unsteady gait Musculoskeletal Management Strategies: Coping strategies, Routine screening      Psychosocial Psychosocial Symptoms Reported: Other Other Psychosocial Conditions: Caregiver Stress Behavioral Management Strategies: Coping strategies, Support system, Community resources Behavioral Health Comment: Family is requesting aid to assist with ADL's, reporting difficulty getting pt to bathe Major Change/Loss/Stressor/Fears (CP): Medical condition, self Techniques to Cope with Loss/Stress/Change: Diversional activities,  Medication Quality of Family Relationships: helpful, involved, supportive    06/05/2024    PHQ2-9 Depression Screening   Little interest or pleasure in doing things    Feeling down, depressed, or hopeless    PHQ-2 - Total Score    Trouble falling or staying asleep, or sleeping too much    Feeling tired or having little energy    Poor appetite or overeating     Feeling bad about yourself - or that you are a failure or have let yourself or your family down    Trouble concentrating on things, such as reading the newspaper or watching television    Moving or speaking so slowly that other people could have noticed.  Or the opposite - being so fidgety or restless that you have been moving around a lot more than usual    Thoughts that you would be better off dead, or hurting yourself in some way    PHQ2-9 Total Score    If you checked off any problems, how difficult have these problems made it for you to do your work, take care of things at home, or get along with other people    Depression Interventions/Treatment      There were no vitals filed for this visit.    Medications Reviewed Today     Reviewed by Ezzard Rolin BIRCH, LCSW (Social Worker) on 06/05/24 at 1307  Med List Status: <None>   Medication Order Taking? Sig Documenting Provider Last Dose Status Informant  amLODipine  (NORVASC ) 10 MG tablet [id number]  Take 1 tablet (10 mg total) by mouth daily. Copland, Jacques, MD  Active   clopidogrel  (PLAVIX ) 75 MG tablet [id number]  Take 1 tablet (75 mg total) by mouth daily. Copland, Jacques, MD  Active   donepezil  (ARICEPT ) 5 MG tablet [id number]  Take 1 tablet (5 mg total) by mouth at bedtime. Medina-Vargas, Monina C, NP  Active   LORazepam  (ATIVAN ) 0.5 MG tablet [id number]  TAKE ONE TABLET BY MOUTH AT BEDTIME AS NEEDED FOR ANXIETY Copland, Spencer, MD  Active   QUEtiapine  (SEROQUEL ) 25 MG tablet [id number]  Take 0.5 tablets (12.5 mg total) by mouth daily. Medina-Vargas, Monina C, NP  Active    simvastatin  (ZOCOR ) 40 MG tablet [id number]  Take 1 tablet (40 mg total) by mouth at bedtime. Watt Mirza, MD  Active             Recommendation:   PCP Follow-up  Follow Up Plan:   Telephone follow-up 2-4 weeks  Rolin Kerns, LCSW [facility]  [facility] Clinical Social Worker Direct Dial: [phone]  Fax: [phone] Website: [email or web address] 1:14 PM

## 2024-06-17 NOTE — Progress Notes (Unsigned)
" ° ° ° °  Wendy Bentson T. Sharice Harriss, MD, CAQ Sports Medicine [facility] at [facility] [address] [place], [address]  Phone: [phone]  FAX: [phone]  Wendy [name] - 74 y.o. female  MRN [id number]  Date of Birth: [birth year]  Date: 06/18/2024  PCP: Watt Mirza, MD  Referral: Watt Mirza, MD  No chief complaint on file.  Subjective:   Wendy [name] is a 74 y.o. very pleasant female patient with There is no height or weight on file to calculate BMI. who presents with the following:  Discussed the use of AI scribe software for clinical note transcription with the patient, who gave verbal consent to proceed.  Wendy [name] and her son are here to talk about ongoing issues of complications from her significant vascular dementia.  We had spoken previously about potential home health involvement, and she had not been seen in more than 90 days. History of Present Illness     Review of Systems is noted in the HPI, as appropriate  Objective:   There were no vitals taken for this visit.  GEN: No acute distress; alert,appropriate. PULM: Breathing comfortably in no respiratory distress PSYCH: Normally interactive.   Laboratory and Imaging Data:  Assessment and Plan:   No diagnosis found. Assessment & Plan   Medication Management during today's office visit: No orders of the defined types were placed in this encounter.  There are no discontinued medications.  Orders placed today for conditions managed today: No orders of the defined types were placed in this encounter.   Disposition: No follow-ups on file.  Dragon Medical One speech-to-text software was used for transcription in this dictation.  Possible transcriptional errors can occur using Animal nutritionist.   Signed,  Mirza DASEN. Natia Fahmy, MD   Outpatient Encounter Medications as of 06/18/2024  Medication Sig   amLODipine  (NORVASC ) 10 MG tablet Take 1 tablet (10 mg total) by mouth  daily.   clopidogrel  (PLAVIX ) 75 MG tablet Take 1 tablet (75 mg total) by mouth daily.   donepezil  (ARICEPT ) 5 MG tablet Take 1 tablet (5 mg total) by mouth at bedtime.   LORazepam  (ATIVAN ) 0.5 MG tablet TAKE ONE TABLET BY MOUTH AT BEDTIME AS NEEDED FOR ANXIETY   QUEtiapine  (SEROQUEL ) 25 MG tablet Take 0.5 tablets (12.5 mg total) by mouth daily.   simvastatin  (ZOCOR ) 40 MG tablet Take 1 tablet (40 mg total) by mouth at bedtime.   No facility-administered encounter medications on file as of 06/18/2024.   "

## 2024-06-18 ENCOUNTER — Encounter: Payer: Self-pay | Admitting: Family Medicine

## 2024-06-18 ENCOUNTER — Ambulatory Visit (INDEPENDENT_AMBULATORY_CARE_PROVIDER_SITE_OTHER): Admitting: Family Medicine

## 2024-06-18 VITALS — BP 130/72 | HR 90 | Temp 98.9°F | Ht 65.0 in | Wt 142.4 lb

## 2024-06-18 DIAGNOSIS — F01C18 Vascular dementia, severe, with other behavioral disturbance: Secondary | ICD-10-CM | POA: Diagnosis not present

## 2024-06-18 DIAGNOSIS — F0150 Vascular dementia without behavioral disturbance: Secondary | ICD-10-CM | POA: Diagnosis not present

## 2024-06-18 DIAGNOSIS — Z8673 Personal history of transient ischemic attack (TIA), and cerebral infarction without residual deficits: Secondary | ICD-10-CM

## 2024-06-18 DIAGNOSIS — R413 Other amnesia: Secondary | ICD-10-CM | POA: Diagnosis not present

## 2024-06-18 DIAGNOSIS — E11311 Type 2 diabetes mellitus with unspecified diabetic retinopathy with macular edema: Secondary | ICD-10-CM

## 2024-06-18 DIAGNOSIS — R479 Unspecified speech disturbances: Secondary | ICD-10-CM

## 2024-06-18 MED ORDER — QUETIAPINE FUMARATE 25 MG PO TABS: 25.0000 mg | ORAL_TABLET | Freq: Every day | ORAL | 1 refills | Status: AC

## 2024-06-22 ENCOUNTER — Telehealth: Payer: Self-pay

## 2024-06-22 NOTE — Progress Notes (Signed)
 Complex Care Management Care Guide Note  06/22/2024 Name: [name] MRN: [id number] DOB: May 08, 1951  Wendy [name] is a 74 y.o. year old female who is a primary care patient of Copland, Jacques, MD and is actively engaged with the care management team. I reached out to Nena JULIANNA Lesches by phone today to assist with re-scheduling  with the BSW.  Follow up plan: Telephone appointment with complex care management team member scheduled for:  06/26/24 at 12:30 p.m.   Dreama [facility]  [facility], [facility] VBCI Assistant Direct Dial: [phone]  Fax: [phone]

## 2024-06-25 ENCOUNTER — Telehealth: Payer: Self-pay | Admitting: Licensed Clinical Social Worker

## 2024-06-26 ENCOUNTER — Other Ambulatory Visit: Payer: Self-pay | Admitting: Licensed Clinical Social Worker

## 2024-06-27 NOTE — Patient Instructions (Signed)
 Visit Information  Thank you for taking time to visit with me today. Please don't hesitate to contact me if I can be of assistance to you before our next scheduled appointment.  Your next care management appointment is by telephone on 2/17 at 12:30 PM  Please call the care guide team at [phone] if you need to cancel, schedule, or reschedule an appointment.   Please call the Suicide and Crisis Lifeline: 988 go to [facility] Urgent [facility] [address] [place] [phone]) call 911 if you are experiencing a Mental Health or Behavioral Health Crisis or need someone to talk to.  Rolin Kerns, LCSW [place]  [facility], [facility] Clinical Social Worker Direct Dial: [phone]  Fax: [phone] Website: [email or web address] 12:58 PM

## 2024-06-27 NOTE — Patient Outreach (Signed)
 Complex Care Management   Visit Note  06/26/2024  Name:  Wendy [name] MRN: [id number] DOB: 02/01/1951  Situation: Referral received for Complex Care Management related to Dementia I obtained verbal consent from Caregiver.  Visit completed with Caregiver  on the phone  Background:   Past Medical History:  Diagnosis Date   Diabetes mellitus    Hypertension    Retinopathy    Sarcoidosis    Stroke [facility])     Assessment: Patient Reported Symptoms:  Cognitive Cognitive Status: Alert and oriented to person, place, and time, Able to follow simple commands Cognitive/Intellectual Conditions Management [RPT]: None reported or documented in medical history or problem list   Health Maintenance Behaviors: Annual physical exam  Neurological Neurological Review of Symptoms: Not assessed    HEENT HEENT Symptoms Reported: Not assessed      Cardiovascular Cardiovascular Symptoms Reported: Not assessed    Respiratory Respiratory Symptoms Reported: Not assesed    Endocrine Endocrine Symptoms Reported: Not assessed    Gastrointestinal Gastrointestinal Symptoms Reported: Not assessed      Genitourinary Genitourinary Symptoms Reported: Not assessed    Integumentary Integumentary Symptoms Reported: Not assessed    Musculoskeletal Musculoskelatal Symptoms Reviewed: Not assessed        Psychosocial Psychosocial Symptoms Reported: Other Other Psychosocial Conditions: Caregiver Stress Behavioral Management Strategies: Adequate rest, Coping strategies, Support system Behavioral Health Self-Management Outcome: 4 (good) Behavioral Health Comment: Family have been working together to provide strong support to pt. Awaiting HH services to assist Major Change/Loss/Stressor/Fears (CP): Medical condition, self Techniques to Cope with Loss/Stress/Change: Diversional activities Quality of Family Relationships: helpful, involved, supportive    06/27/2024    PHQ2-9 Depression Screening   Little  interest or pleasure in doing things    Feeling down, depressed, or hopeless    PHQ-2 - Total Score    Trouble falling or staying asleep, or sleeping too much    Feeling tired or having little energy    Poor appetite or overeating     Feeling bad about yourself - or that you are a failure or have let yourself or your family down    Trouble concentrating on things, such as reading the newspaper or watching television    Moving or speaking so slowly that other people could have noticed.  Or the opposite - being so fidgety or restless that you have been moving around a lot more than usual    Thoughts that you would be better off dead, or hurting yourself in some way    PHQ2-9 Total Score    If you checked off any problems, how difficult have these problems made it for you to do your work, take care of things at home, or get along with other people    Depression Interventions/Treatment      There were no vitals filed for this visit.    Medications Reviewed Today     Reviewed by Odeal Welden D, LCSW (Social Worker) on 06/27/24 at 1251  Med List Status: <None>   Medication Order Taking? Sig Documenting Provider Last Dose Status Informant  amLODipine  (NORVASC ) 10 MG tablet [id number]  Take 1 tablet (10 mg total) by mouth daily. Copland, Jacques, MD  Active   clopidogrel  (PLAVIX ) 75 MG tablet [id number]  Take 1 tablet (75 mg total) by mouth daily. Copland, Jacques, MD  Active   donepezil  (ARICEPT ) 5 MG tablet [id number]  Take 1 tablet (5 mg total) by mouth at bedtime. Medina-Vargas, Monina C, NP  Active  LORazepam  (ATIVAN ) 0.5 MG tablet [id number]  TAKE ONE TABLET BY MOUTH AT BEDTIME AS NEEDED FOR ANXIETY Copland, Spencer, MD  Active   QUEtiapine  (SEROQUEL ) 25 MG tablet [id number]  Take 1 tablet (25 mg total) by mouth daily. Copland, Jacques, MD  Active   simvastatin  (ZOCOR ) 40 MG tablet [id number]  Take 1 tablet (40 mg total) by mouth at bedtime.  Patient not taking: Reported on 06/18/2024    Watt Jacques, MD  Active             Recommendation:   Continue Current Plan of Care  Follow Up Plan:   Telephone follow-up in 1 month  Rolin Kerns, LCSW [facility]  [facility], [facility] Clinical Social Worker Direct Dial: [phone]  Fax: [phone] Website: [email or web address] 12:57 PM

## 2024-06-28 ENCOUNTER — Other Ambulatory Visit: Payer: Self-pay | Admitting: Family Medicine

## 2024-06-28 ENCOUNTER — Telehealth: Payer: Self-pay | Admitting: *Deleted

## 2024-06-28 DIAGNOSIS — F419 Anxiety disorder, unspecified: Secondary | ICD-10-CM

## 2024-06-28 NOTE — Telephone Encounter (Signed)
 Copied from CRM #[id number]. Topic: Clinical - Medical Advice >> Jun 28, 2024 10:12 AM Chasity T wrote: Reason for CRM: Lauren from [facility] is calling per patients family request for dr Copland can be her dr for her hospics care. Please contact her back to discuss  Phone number: [phone]

## 2024-06-28 NOTE — Telephone Encounter (Signed)
 Last office visit 06/18/24 for [facility] referral.  Last refilled 03/21/2024 for #30 with 2 refills.  Next appt: 07/19/24

## 2024-06-29 NOTE — Telephone Encounter (Signed)
 I spoke with Leita on the telephone.  I had made a consult to home health for RN to assess and possible home health aide.  I do not think that we can assume that she will pass away in the next 6 months.  She does have significant dementia, but I would not anticipate a fatal diagnosis imminently.

## 2024-07-02 ENCOUNTER — Other Ambulatory Visit: Payer: Self-pay | Admitting: Adult Health

## 2024-07-02 DIAGNOSIS — G3184 Mild cognitive impairment, so stated: Secondary | ICD-10-CM

## 2024-07-12 ENCOUNTER — Ambulatory Visit

## 2024-07-12 ENCOUNTER — Telehealth: Payer: Self-pay | Admitting: Family Medicine

## 2024-07-12 NOTE — Telephone Encounter (Signed)
Yes, verbal orders are ok

## 2024-07-12 NOTE — Telephone Encounter (Signed)
 Copied from CRM #[id number]. Topic: Clinical - Home Health Verbal Orders >> Jul 11, 2024  5:04 PM Zebedee SAUNDERS wrote: Caller/Agency: [facility] per Arva Rushing Number: [phone] Service Requested: Cognitive Communication Therapy Frequency: 1week for 8 weeks,  Any new concerns about the patient? No

## 2024-07-12 NOTE — Telephone Encounter (Signed)
 Relayed message to Sena from [facility].

## 2024-07-12 NOTE — Telephone Encounter (Signed)
 Left message for Sema to return call to office in regards to orders.  Unable to leave orders due to unidentifiable voice mail.

## 2024-07-17 ENCOUNTER — Encounter: Payer: Self-pay | Admitting: Family Medicine

## 2024-07-17 DIAGNOSIS — E113293 Type 2 diabetes mellitus with mild nonproliferative diabetic retinopathy without macular edema, bilateral: Secondary | ICD-10-CM | POA: Insufficient documentation

## 2024-07-17 NOTE — Progress Notes (Unsigned)
" ° ° ° °  Analeise Mccleery T. Taya Ashbaugh, MD, CAQ Sports Medicine [facility] at [facility] [address] [place], [address]  Phone: [phone]  FAX: [phone]  Wendy [name] - 74 y.o. female  MRN [id number]  Date of Birth: [birth year]  Date: 07/19/2024  PCP: Watt Mirza, MD  Referral: Watt Mirza, MD  No chief complaint on file.  Subjective:   Wendy [name] is a 74 y.o. very pleasant female patient with There is no height or weight on file to calculate BMI. who presents with the following:  Discussed the use of AI scribe software for clinical note transcription with the patient, who gave verbal consent to proceed.  Is a pleasant patient age 7, who has severe vascular dementia.  I recently saw her and we tried to help get more additional help in the home and home health referral. History of Present Illness     Review of Systems is noted in the HPI, as appropriate  Objective:   There were no vitals taken for this visit.  GEN: No acute distress; alert,appropriate. PULM: Breathing comfortably in no respiratory distress PSYCH: Normally interactive.   Laboratory and Imaging Data:  Assessment and Plan:   No diagnosis found. Assessment & Plan   Medication Management during today's office visit: No orders of the defined types were placed in this encounter.  There are no discontinued medications.  Orders placed today for conditions managed today: No orders of the defined types were placed in this encounter.   Disposition: No follow-ups on file.  Dragon Medical One speech-to-text software was used for transcription in this dictation.  Possible transcriptional errors can occur using Animal nutritionist.   Signed,  Mirza DASEN. Puja Caffey, MD   Outpatient Encounter Medications as of 07/19/2024  Medication Sig   amLODipine  (NORVASC ) 10 MG tablet Take 1 tablet (10 mg total) by mouth daily.   clopidogrel  (PLAVIX ) 75 MG tablet Take 1 tablet (75 mg  total) by mouth daily.   donepezil  (ARICEPT ) 5 MG tablet Take 1 tablet (5 mg total) by mouth at bedtime.   LORazepam  (ATIVAN ) 0.5 MG tablet TAKE ONE TABLET BY MOUTH AT BEDTIME AS NEEDED FOR ANXIETY   QUEtiapine  (SEROQUEL ) 25 MG tablet Take 1 tablet (25 mg total) by mouth daily.   simvastatin  (ZOCOR ) 40 MG tablet Take 1 tablet (40 mg total) by mouth at bedtime. (Patient not taking: Reported on 06/18/2024)   No facility-administered encounter medications on file as of 07/19/2024.   "

## 2024-07-18 ENCOUNTER — Ambulatory Visit: Admitting: Family Medicine

## 2024-07-19 ENCOUNTER — Ambulatory Visit: Admitting: Family Medicine

## 2024-07-19 ENCOUNTER — Ambulatory Visit

## 2024-07-19 ENCOUNTER — Encounter: Payer: Self-pay | Admitting: Family Medicine

## 2024-07-19 VITALS — BP 160/80 | HR 83 | Temp 98.7°F | Ht 65.0 in | Wt 148.6 lb

## 2024-07-19 VITALS — BP 126/65 | HR 83 | Temp 98.7°F | Ht 65.0 in | Wt 148.6 lb

## 2024-07-19 DIAGNOSIS — Z8673 Personal history of transient ischemic attack (TIA), and cerebral infarction without residual deficits: Secondary | ICD-10-CM

## 2024-07-19 DIAGNOSIS — E1159 Type 2 diabetes mellitus with other circulatory complications: Secondary | ICD-10-CM

## 2024-07-19 DIAGNOSIS — F0150 Vascular dementia without behavioral disturbance: Secondary | ICD-10-CM

## 2024-07-19 DIAGNOSIS — Z789 Other specified health status: Secondary | ICD-10-CM

## 2024-07-19 DIAGNOSIS — N1831 Chronic kidney disease, stage 3a: Secondary | ICD-10-CM

## 2024-07-19 DIAGNOSIS — I10 Essential (primary) hypertension: Secondary | ICD-10-CM

## 2024-07-19 DIAGNOSIS — E782 Mixed hyperlipidemia: Secondary | ICD-10-CM

## 2024-07-19 DIAGNOSIS — Z0000 Encounter for general adult medical examination without abnormal findings: Secondary | ICD-10-CM

## 2024-07-19 DIAGNOSIS — E113293 Type 2 diabetes mellitus with mild nonproliferative diabetic retinopathy without macular edema, bilateral: Secondary | ICD-10-CM

## 2024-07-19 LAB — POCT GLYCOSYLATED HEMOGLOBIN (HGB A1C): Hemoglobin A1C: 6.4 % — AB (ref 4.0–5.6)

## 2024-07-19 NOTE — Progress Notes (Signed)
 "  Chief Complaint  Patient presents with   Medicare Wellness     Subjective:   Wendy [name] is a 74 y.o. female who presents for a Medicare Annual Wellness Visit.  Visit info / Clinical Intake: Medicare Wellness Visit Type:: Subsequent Annual Wellness Visit Persons participating in visit and providing information:: patient & caregiver Medicare Wellness Visit Mode:: In-person (required for WTM) Interpreter Needed?: No Pre-visit prep was completed: yes AWV questionnaire completed by patient prior to visit?: no Living arrangements:: lives with spouse/significant other Patient's Overall Health Status Rating: (!) poor Typical amount of pain: (!) a lot Does pain affect daily life?: (!) yes Are you currently prescribed opioids?: no  Dietary Habits and Nutritional Risks How many meals a day?: 2 Eats fruit and vegetables daily?: (!) no Most meals are obtained by: preparing own meals In the last 2 weeks, have you had any of the following?: none Diabetic:: (!) yes Any non-healing wounds?: no How often do you check your BS?: 0 Would you like to be referred to a Nutritionist or for Diabetic Management? : no  Functional Status Activities of Daily Living (to include ambulation/medication): (!) Needs Assist Feeding: Independent Dressing/Grooming: Needs assistance Bathing: Needs assistance Toileting: Needs assistance Transfer: Needs assistance Ambulation: Needs Assistance Medication Administration: Dependent Home Management (perform basic housework or laundry): Dependent Manage your own finances?: yes Primary transportation is: family / friends Concerns about vision?: (!) yes Concerns about hearing?: no  Fall Screening Falls in the past year?: 1 Number of falls in past year: 1 Was there an injury with Fall?: 0 Fall Risk Category Calculator: 2 Patient Fall Risk Level: Moderate Fall Risk  Fall Risk Patient at Risk for Falls Due to: History of fall(s); Impaired balance/gait;  Impaired mobility; Medication side effect Fall risk Follow up: Falls prevention discussed; Education provided; Falls evaluation completed  Home and Transportation Safety: All rugs have non-skid backing?: N/A, no rugs All stairs or steps have railings?: N/A, no stairs Grab bars in the bathtub or shower?: (!) no Have non-skid surface in bathtub or shower?: yes Good home lighting?: yes Regular seat belt use?: yes Hospital stays in the last year:: no  Cognitive Assessment Difficulty concentrating, remembering, or making decisions? : yes Will 6CIT or Mini Cog be Completed: no 6CIT or Mini Cog Declined: patient has a diagnosis of dementia or cognitive impairment  Advance Directives (For Healthcare) Does Patient Have a Medical Advance Directive?: Yes Type of Advance Directive: Healthcare Power of Attorney Copy of Healthcare Power of Attorney in Chart?: No - copy requested Would patient like information on creating a medical advance directive?: No - Patient declined  Reviewed/Updated  Reviewed/Updated: Reviewed All (Medical, Surgical, Family, Medications, Allergies, Care Teams, Patient Goals)    Allergies (verified) Lisinopril  and Metformin  and related   Current Medications (verified) Outpatient Encounter Medications as of 07/19/2024  Medication Sig   amLODipine  (NORVASC ) 10 MG tablet Take 1 tablet (10 mg total) by mouth daily.   clopidogrel  (PLAVIX ) 75 MG tablet Take 1 tablet (75 mg total) by mouth daily.   donepezil  (ARICEPT ) 5 MG tablet Take 1 tablet (5 mg total) by mouth at bedtime.   LORazepam  (ATIVAN ) 0.5 MG tablet TAKE ONE TABLET BY MOUTH AT BEDTIME AS NEEDED FOR ANXIETY   QUEtiapine  (SEROQUEL ) 25 MG tablet Take 1 tablet (25 mg total) by mouth daily.   simvastatin  (ZOCOR ) 40 MG tablet Take 1 tablet (40 mg total) by mouth at bedtime. (Patient not taking: Reported on 07/19/2024)   No facility-administered  encounter medications on file as of 07/19/2024.    History: Past Medical  History:  Diagnosis Date   Arthritis 47yrs ago?   Cataract 89yrs?   CKD stage 3a, GFR 45-59 ml/min (HCC) 10/14/2014   Diabetic retinopathy associated with controlled type 2 diabetes mellitus (HCC)    History of cardioembolic cerebrovascular accident (CVA) 10/20/2018   History of stroke 09/26/2015   Hypertension    Mixed hyperlipidemia 08/10/2011   Sarcoidosis    Stroke [facility]) 15yrs ago?   Type 2 diabetes mellitus with vascular disease (HCC) 06/26/2008   Qualifier: Diagnosis of   By: Loletta MD, Vijay         Vascular dementia without behavioral disturbance (HCC) 01/17/2024   Past Surgical History:  Procedure Laterality Date   CESAREAN SECTION  Years ago   EP IMPLANTABLE DEVICE N/A 09/29/2015   Procedure: Loop Recorder Insertion;  Surgeon: Danelle LELON Birmingham, MD;  Location: [facility] INVASIVE CV LAB;  Service: Cardiovascular;  Laterality: N/A;   INTRAOCULAR LENS INSERTION     KNEE SURGERY  1999   PET/CT MONITORING/KAPOSIS/SARCOM ([facility] HX)     TEE WITHOUT CARDIOVERSION N/A 09/29/2015   Procedure: TRANSESOPHAGEAL ECHOCARDIOGRAM (TEE);  Surgeon: Ezra GORMAN Shuck, MD;  Location: [facility] ENDOSCOPY;  Service: Cardiovascular;  Laterality: N/A;   Family History  Problem Relation Age of Onset   Diabetes Mother    Arthritis Mother    Hypertension Son    Alcohol  abuse Brother    Drug abuse Brother    Depression Brother    Diabetes Sister    Social History   Occupational History   Not on file  Tobacco Use   Smoking status: Former    Current packs/day: 0.00    Types: Cigarettes    Quit date: 08/09/1976    Years since quitting: 47.9   Smokeless tobacco: Never  Vaping Use   Vaping status: Never Used  Substance and Sexual Activity   Alcohol  use: Never   Drug use: Never   Sexual activity: Not Currently    Birth control/protection: None   Tobacco Counseling Counseling given: Not Answered  SDOH Screenings   Food Insecurity: No Food Insecurity (07/19/2024)  Housing: Low Risk (07/19/2024)   Transportation Needs: No Transportation Needs (07/19/2024)  Utilities: Not At Risk (07/19/2024)  Alcohol  Screen: Low Risk (07/19/2024)  Depression (PHQ2-9): High Risk (07/19/2024)  Financial Resource Strain: Low Risk (07/19/2024)  Physical Activity: Inactive (07/19/2024)  Social Connections: Patient Unable To Answer (07/19/2024)  Stress: Stress Concern Present (07/19/2024)  Tobacco Use: Medium Risk (07/19/2024)  Health Literacy: Inadequate Health Literacy (07/19/2024)   See flowsheets for full screening details  Depression Screen PHQ 2 & 9 Depression Scale- Over the past 2 weeks, how often have you been bothered by any of the following problems? Little interest or pleasure in doing things: 1 Feeling down, depressed, or hopeless (PHQ Adolescent also includes...irritable): 1 PHQ-2 Total Score: 2 Trouble falling or staying asleep, or sleeping too much: 3 Feeling tired or having little energy: 3 Poor appetite or overeating (PHQ Adolescent also includes...weight loss): 0 Feeling bad about yourself - or that you are a failure or have let yourself or your family down: 0 Trouble concentrating on things, such as reading the newspaper or watching television (PHQ Adolescent also includes...like school work): 3 Moving or speaking so slowly that other people could have noticed. Or the opposite - being so fidgety or restless that you have been moving around a lot more than usual: 2 Thoughts that you would be better  off dead, or of hurting yourself in some way: 0 PHQ-9 Total Score: 13 If you checked off any problems, how difficult have these problems made it for you to do your work, take care of things at home, or get along with other people?: Very difficult  Depression Treatment Depression Interventions/Treatment : Patient refuses Treatment     Goals Addressed               This Visit's Progress     Patient Stated (pt-stated)        Denies goals             Objective:    Today's Vitals   07/19/24  1356  BP: (!) 160/80  Pulse: 83  Temp: 98.7 F (37.1 C)  TempSrc: Oral  SpO2: 99%  Weight: 148 lb 9.6 oz (67.4 kg)  Height: 5' 5 (1.651 m)   Body mass index is 24.73 kg/m.  Hearing/Vision screen Vision Screening - Comments:: No regular eye exams Immunizations and Health Maintenance Health Maintenance  Topic Date Due   COVID-19 Vaccine (1) Never done   Pneumococcal Vaccine: 50+ Years (1 of 2 - PCV) Never done   Zoster Vaccines- Shingrix (1 of 2) Never done   Fecal DNA (Cologuard)  Never done   COLON CANCER SCREENING ANNUAL FOBT  11/20/2009   Mammogram  01/07/2011   Bone Density Scan  Never done   OPHTHALMOLOGY EXAM  10/20/2016   FOOT EXAM  11/12/2023   Diabetic kidney evaluation - Urine ACR  02/11/2024   HEMOGLOBIN A1C  04/17/2024   Influenza Vaccine  09/11/2024 (Originally 01/13/2024)   Diabetic kidney evaluation - eGFR measurement  01/17/2025   LIPID PANEL  01/17/2025   Medicare Annual Wellness (AWV)  07/19/2025   Hepatitis C Screening  Completed   Meningococcal B Vaccine  Aged Out   DTaP/Tdap/Td  Discontinued        Assessment/Plan:  This is a routine wellness examination for Carlean.  Patient Care Team: Watt Mirza, MD as PCP - General (Family Medicine)  I have personally reviewed and noted the following in the patients chart:   Medical and social history Use of alcohol , tobacco or illicit drugs  Current medications and supplements including opioid prescriptions. Functional ability and status Nutritional status Physical activity Advanced directives List of other physicians Hospitalizations, surgeries, and ER visits in previous 12 months Vitals Screenings to include cognitive, depression, and falls Referrals and appointments  No orders of the defined types were placed in this encounter.  In addition, I have reviewed and discussed with patient certain preventive protocols, quality metrics, and best practice recommendations. A written personalized  care plan for preventive services as well as general preventive health recommendations were provided to patient.   Ardella FORBES Dawn, LPN   12/16/7971   Return in 1 year (on 07/19/2025).  After Visit Summary: (In Person-Printed) AVS printed and given to the patient  Nurse Notes: Patient no longer takes vaccines or completes preventative procedures. "

## 2024-07-19 NOTE — Patient Instructions (Signed)
 Wendy [name],  Thank you for taking the time for your Medicare Wellness Visit. I appreciate your continued commitment to your health goals. Please review the care plan we discussed, and feel free to reach out if I can assist you further.  Please note that Annual Wellness Visits do not include a physical exam. Some assessments may be limited, especially if the visit was conducted virtually. If needed, we may recommend an in-person follow-up with your provider.  Ongoing Care Seeing your primary care provider every 3 to 6 months helps us  monitor your health and provide consistent, personalized care.   Referrals If a referral was made during today's visit and you haven't received any updates within two weeks, please contact the referred provider directly to check on the status.  Recommended Screenings:  Health Maintenance  Topic Date Due   COVID-19 Vaccine (1) Never done   Pneumococcal Vaccine for age over 85 (1 of 2 - PCV) Never done   Zoster (Shingles) Vaccine (1 of 2) Never done   Cologuard (Stool DNA test)  Never done   Stool Blood Test  11/20/2009   Breast Cancer Screening  01/07/2011   Osteoporosis screening with Bone Density Scan  Never done   Eye exam for diabetics  10/20/2016   Complete foot exam   11/12/2023   Kidney health urinalysis for diabetes  02/11/2024   Medicare Annual Wellness Visit  04/07/2024   Hemoglobin A1C  04/17/2024   Flu Shot  09/11/2024*   Yearly kidney function blood test for diabetes  01/17/2025   Lipid (cholesterol) test  01/17/2025   Hepatitis C Screening  Completed   Meningitis B Vaccine  Aged Out   DTaP/Tdap/Td vaccine  Discontinued  *Topic was postponed. The date shown is not the original due date.       07/19/2024    2:03 PM  Advanced Directives  Does Patient Have a Medical Advance Directive? Yes  Type of Advance Directive Healthcare Power of Attorney  Copy of Healthcare Power of Attorney in Chart? No - copy requested    Vision: Annual vision  screenings are recommended for early detection of glaucoma, cataracts, and diabetic retinopathy. These exams can also reveal signs of chronic conditions such as diabetes and high blood pressure.  Dental: Annual dental screenings help detect early signs of oral cancer, gum disease, and other conditions linked to overall health, including heart disease and diabetes.  Please see the attached documents for additional preventive care recommendations.

## 2024-07-20 DIAGNOSIS — Z789 Other specified health status: Secondary | ICD-10-CM | POA: Insufficient documentation

## 2024-07-31 ENCOUNTER — Telehealth: Admitting: Licensed Clinical Social Worker
